# Patient Record
Sex: Female | Born: 1935
Health system: Southern US, Community
[De-identification: ages and names within clinical notes are randomized; demographics above are authoritative.]

## PROBLEM LIST (undated history)

## (undated) ENCOUNTER — Ambulatory Visit: Admission: EM | Discharge status: Absent without leave | Payer: Medicare Other

## (undated) DIAGNOSIS — Z972 Presence of dental prosthetic device (complete) (partial): Secondary | ICD-10-CM

## (undated) DIAGNOSIS — I10 Essential (primary) hypertension: Secondary | ICD-10-CM

## (undated) DIAGNOSIS — E119 Type 2 diabetes mellitus without complications: Secondary | ICD-10-CM

## (undated) DIAGNOSIS — K08109 Complete loss of teeth, unspecified cause, unspecified class: Secondary | ICD-10-CM

## (undated) DIAGNOSIS — N182 Chronic kidney disease, stage 2 (mild): Secondary | ICD-10-CM

## (undated) DIAGNOSIS — E1165 Type 2 diabetes mellitus with hyperglycemia: Principal | ICD-10-CM

## (undated) DIAGNOSIS — R413 Other amnesia: Secondary | ICD-10-CM

## (undated) DIAGNOSIS — H269 Unspecified cataract: Secondary | ICD-10-CM

## (undated) DIAGNOSIS — F0390 Unspecified dementia without behavioral disturbance: Secondary | ICD-10-CM

## (undated) DIAGNOSIS — Z973 Presence of spectacles and contact lenses: Secondary | ICD-10-CM

## (undated) DIAGNOSIS — E785 Hyperlipidemia, unspecified: Secondary | ICD-10-CM

## (undated) DIAGNOSIS — M810 Age-related osteoporosis without current pathological fracture: Secondary | ICD-10-CM

## (undated) DIAGNOSIS — G250 Essential tremor: Secondary | ICD-10-CM

## (undated) DIAGNOSIS — E559 Vitamin D deficiency, unspecified: Secondary | ICD-10-CM

## (undated) HISTORY — DX: Essential tremor: G25.0

## (undated) HISTORY — PX: APPENDECTOMY: SHX54

## (undated) HISTORY — PX: COLONOSCOPY: SHX174

## (undated) HISTORY — DX: Type 2 diabetes mellitus without complications: E11.9

## (undated) HISTORY — PX: TUBAL LIGATION: SHX77

## (undated) HISTORY — DX: Other amnesia: R41.3

## (undated) HISTORY — DX: Hyperlipidemia, unspecified: E78.5

## (undated) HISTORY — DX: Age-related osteoporosis without current pathological fracture: M81.0

## (undated) HISTORY — DX: Chronic kidney disease, stage 2 (mild): N18.2

## (undated) HISTORY — DX: Unspecified cataract: H26.9

## (undated) HISTORY — DX: Type 2 diabetes mellitus with hyperglycemia: E11.65

## (undated) HISTORY — DX: Vitamin D deficiency, unspecified: E55.9

---

## 2001-09-01 ENCOUNTER — Encounter: Payer: Self-pay | Admitting: Emergency Medicine

## 2001-09-01 ENCOUNTER — Emergency Department (HOSPITAL_COMMUNITY): Admission: EM | Admit: 2001-09-01 | Discharge: 2001-09-01 | Payer: Self-pay | Admitting: Emergency Medicine

## 2008-02-11 HISTORY — PX: OTHER SURGICAL HISTORY: SHX169

## 2011-12-02 ENCOUNTER — Encounter: Payer: Self-pay | Admitting: Internal Medicine

## 2012-01-05 ENCOUNTER — Encounter: Payer: Self-pay | Admitting: *Deleted

## 2012-01-29 ENCOUNTER — Encounter: Payer: Self-pay | Admitting: Internal Medicine

## 2012-01-30 ENCOUNTER — Ambulatory Visit: Payer: Self-pay | Admitting: Internal Medicine

## 2012-02-11 HISTORY — PX: CATARACT EXTRACTION, BILATERAL: SHX1313

## 2012-02-27 ENCOUNTER — Ambulatory Visit (AMBULATORY_SURGERY_CENTER): Payer: Medicare Other | Admitting: *Deleted

## 2012-02-27 VITALS — Ht 64.5 in | Wt 183.0 lb

## 2012-02-27 DIAGNOSIS — Z1211 Encounter for screening for malignant neoplasm of colon: Secondary | ICD-10-CM

## 2012-02-27 MED ORDER — MOVIPREP 100 G PO SOLR: 1.0000 | Freq: Once | ORAL | Status: DC

## 2012-02-27 NOTE — Progress Notes (Signed)
Daughter very concerned about removing polyps at the same time as screening. Stated they had a family member that had a perforation and required multiple surgeries all because the physician was in too big a hurry. Offered to cancel appointment and schedule office visit for patient and daughter to express concerns. Patient wishes to proceed with procedure without appointment. Daughter signed permit for patient as patient states she cannot see well with her glasses and that daughter has guardianship for her.

## 2012-03-12 ENCOUNTER — Encounter: Payer: Self-pay | Admitting: Internal Medicine

## 2012-03-12 ENCOUNTER — Ambulatory Visit (AMBULATORY_SURGERY_CENTER): Payer: Medicare Other | Admitting: Internal Medicine

## 2012-03-12 VITALS — BP 153/91 | HR 90 | Temp 97.3°F | Resp 14 | Ht 65.0 in | Wt 183.0 lb

## 2012-03-12 DIAGNOSIS — Z1211 Encounter for screening for malignant neoplasm of colon: Secondary | ICD-10-CM

## 2012-03-12 DIAGNOSIS — D126 Benign neoplasm of colon, unspecified: Secondary | ICD-10-CM

## 2012-03-12 MED ORDER — SODIUM CHLORIDE 0.9 % IV SOLN: 500.0000 mL | INTRAVENOUS | Status: DC

## 2012-03-12 NOTE — Op Note (Signed)
[facility] [address] [place], [address]   COLONOSCOPY PROCEDURE REPORT  PATIENT: Stacey, Fuentes  MR#: [id number] BIRTHDATE: 22-Jul-1935 , 76  yrs. old GENDER: Female ENDOSCOPIST: Hart Carwin, MD REFERRED BY:  Laurann Montana, M.D. PROCEDURE DATE:  03/12/2012 PROCEDURE:   Colonoscopy with snare polypectomy ASA CLASS:   Class II INDICATIONS:Average risk patient for colon cancer. MEDICATIONS: Propofol (Diprivan) 230 mg IV  DESCRIPTION OF PROCEDURE:   After the risks and benefits and of the procedure were explained, informed consent was obtained.  A digital rectal exam revealed no abnormalities of the rectum.    The LB [id number]-[id number]  endoscope was introduced through the anus and advanced to the cecum, which was identified by both the appendix and ileocecal valve .  The quality of the prep was good, using MoviPrep .  The instrument was then slowly withdrawn as the colon was fully examined.     COLON FINDINGS: A smooth sessile polyp ranging between 3-4mm in size was found in the ascending colon.  A polypectomy was performed with a cold snare.  The resection was complete and the polyp tissue was completely retrieved.     Retroflexed views revealed no abnormalities.Sigmoid colon was spastic, pt was unable to retain air due to decreased sphincter tone.     The scope was then withdrawn from the patient and the procedure completed.  COMPLICATIONS: There were no complications. ENDOSCOPIC IMPRESSION: Sessile polyp ranging between 3-57mm in size was found in the ascending colon; polypectomy was performed with a cold snare  RECOMMENDATIONS: 1.  Await pathology results 2.  High fiber diet   REPEAT EXAM: for Colonoscopy. pending results of the polyp  cc:  _______________________________ eSigned:  Hart Carwin, MD 03/12/2012 10:59 AM     PATIENT NAME:  Stacey, Fuentes MR#: [id number]

## 2012-03-12 NOTE — Progress Notes (Signed)
Called to room to assist during endoscopic procedure.  Patient ID and intended procedure confirmed with present staff. Received instructions for my participation in the procedure from the performing physician.  

## 2012-03-12 NOTE — Progress Notes (Signed)
Patient did not experience any of the following events: a burn prior to discharge; a fall within the facility; wrong site/side/patient/procedure/implant event; or a hospital transfer or hospital admission upon discharge from the facility. (G8907) Patient with preoperative order for IV antibiotic SSI prophylaxis, antibiotic initiated on time. (G8916)  

## 2012-03-12 NOTE — Patient Instructions (Addendum)
YOU HAD AN ENDOSCOPIC PROCEDURE TODAY AT THE [facility]: Refer to the procedure report that was given to you for any specific questions about what was found during the examination.  If the procedure report does not answer your questions, please call your gastroenterologist to clarify.  If you requested that your care partner not be given the details of your procedure findings, then the procedure report has been included in a sealed envelope for you to review at your convenience later.  YOU SHOULD EXPECT: Some feelings of bloating in the abdomen. Passage of more gas than usual.  Walking can help get rid of the air that was put into your GI tract during the procedure and reduce the bloating. If you had a lower endoscopy (such as a colonoscopy or flexible sigmoidoscopy) you may notice spotting of blood in your stool or on the toilet paper. If you underwent a bowel prep for your procedure, then you may not have a normal bowel movement for a few days.  DIET: Your first meal following the procedure should be a light meal and then it is ok to progress to your normal diet.  A half-sandwich or bowl of soup is an example of a good first meal.  Heavy or fried foods are harder to digest and may make you feel nauseous or bloated.  Likewise meals heavy in dairy and vegetables can cause extra gas to form and this can also increase the bloating.  Drink plenty of fluids but you should avoid alcoholic beverages for 24 hours.  ACTIVITY: Your care partner should take you home directly after the procedure.  You should plan to take it easy, moving slowly for the rest of the day.  You can resume normal activity the day after the procedure however you should NOT DRIVE or use heavy machinery for 24 hours (because of the sedation medicines used during the test).    SYMPTOMS TO REPORT IMMEDIATELY: A gastroenterologist can be reached at any hour.  During normal business hours, 8:30 AM to 5:00 PM Monday through Friday,  call [phone].  After hours and on weekends, please call the GI answering service at [phone] who will take a message and have the physician on call contact you.   Following lower endoscopy (colonoscopy or flexible sigmoidoscopy):  Excessive amounts of blood in the stool  Significant tenderness or worsening of abdominal pains  Swelling of the abdomen that is new, acute  Fever of 100F or higher    FOLLOW UP: If any biopsies were taken you will be contacted by phone or by letter within the next 1-3 weeks.  Call your gastroenterologist if you have not heard about the biopsies in 3 weeks.  Our staff will call the home number listed on your records the next business day following your procedure to check on you and address any questions or concerns that you may have at that time regarding the information given to you following your procedure. This is a courtesy call and so if there is no answer at the home number and we have not heard from you through the emergency physician on call, we will assume that you have returned to your regular daily activities without incident.  SIGNATURES/CONFIDENTIALITY: You and/or your care partner have signed paperwork which will be entered into your electronic medical record.  These signatures attest to the fact that that the information above on your After Visit Summary has been reviewed and is understood.  Full responsibility of the confidentiality   of this discharge information lies with you and/or your care-partner.  Polyp and high fiber information given. 

## 2012-03-15 ENCOUNTER — Telehealth: Payer: Self-pay | Admitting: *Deleted

## 2012-03-15 NOTE — Telephone Encounter (Signed)
  Follow up Call-  Call back number 03/12/2012  Post procedure Call Back phone  # [phone]  Permission to leave phone message Yes     Patient questions:  Do you have a fever, pain , or abdominal swelling? no Pain Score  0 *  Have you tolerated food without any problems? yes  Have you been able to return to your normal activities? yes  Do you have any questions about your discharge instructions: Diet   no Medications  no Follow up visit  no  Do you have questions or concerns about your Care? no  Actions: * If pain score is 4 or above: No action needed, pain <4. SPOKE WITH PATIENT'S DAUGHTER WHO REPORTS HER MOTHER IS OK. DENIES FEVER, PAIN OR SWELLING. PATIENT BACK TO NORMAL. PATIENT DID EXPERIENCE ELEVATED BLOOD SUGAR ON SATURDAY, BUT BACK TO NORMAL ON Sunday.

## 2012-03-16 ENCOUNTER — Encounter: Payer: Self-pay | Admitting: Internal Medicine

## 2012-12-17 ENCOUNTER — Ambulatory Visit (INDEPENDENT_AMBULATORY_CARE_PROVIDER_SITE_OTHER): Payer: Medicare Other | Admitting: General Surgery

## 2013-04-15 ENCOUNTER — Encounter (INDEPENDENT_AMBULATORY_CARE_PROVIDER_SITE_OTHER): Payer: Self-pay | Admitting: General Surgery

## 2013-04-15 ENCOUNTER — Ambulatory Visit (INDEPENDENT_AMBULATORY_CARE_PROVIDER_SITE_OTHER): Payer: PRIVATE HEALTH INSURANCE | Admitting: General Surgery

## 2013-04-15 VITALS — BP 128/74 | HR 75 | Temp 98.5°F | Resp 16 | Ht 64.0 in | Wt 180.0 lb

## 2013-04-15 DIAGNOSIS — L723 Sebaceous cyst: Secondary | ICD-10-CM

## 2013-04-15 DIAGNOSIS — L089 Local infection of the skin and subcutaneous tissue, unspecified: Secondary | ICD-10-CM | POA: Insufficient documentation

## 2013-04-15 NOTE — Progress Notes (Signed)
Patient ID: Stacey Fuentes, female   DOB: 05-01-1935, 78 y.o.   MRN: [id number]  Chief Complaint  Patient presents with  . Cyst    HPI Stacey Fuentes is a 78 y.o. female.  We're asked to see the patient in consultation by Dr. Dema Severin to evaluate her for a cyst on her back. The patient is a 78 year old black female who has had a cyst on her back for a number of years. It intermittently will flareup and become painful and swollen and draining. She has had it incised at urgent care is a couple of times in the past. She denies any fevers or chills. The most recent episode has been going on for about 3 weeks.  HPI  Past Medical History  Diagnosis Date  . Diabetes mellitus   . Cataract   . Benign essential tremor   . Hyperlipidemia     Past Surgical History  Procedure Laterality Date  . Surgery to remove cyst on back  2010  . Appendectomy      childhood  . Colonoscopy      in [place] in 2000    Family History  Problem Relation Age of Onset  . Colon cancer Neg Hx   . Esophageal cancer Neg Hx   . Rectal cancer Neg Hx   . Stomach cancer Neg Hx     Social History History  Substance Use Topics  . Smoking status: Never Smoker   . Smokeless tobacco: Never Used  . Alcohol Use: No    Allergies  Allergen Reactions  . Metformin And Related     Current Outpatient Prescriptions  Medication Sig Dispense Refill  . aspirin 81 MG tablet Take 81 mg by mouth daily.      . calcium carbonate 200 MG capsule Take 1 capsule by mouth as directed      . insulin glargine (LANTUS SOLOSTAR) 100 UNIT/ML injection Inject 40 Units into the skin at bedtime.      . Omega-3 Fatty Acids (FISH OIL) 1000 MG CAPS Take 1 capsule by mouth daily.       No current facility-administered medications for this visit.    Review of Systems Review of Systems  Constitutional: Negative.   HENT: Negative.   Eyes: Negative.   Respiratory: Negative.   Cardiovascular: Negative.   Gastrointestinal: Negative.    Endocrine: Negative.   Genitourinary: Negative.   Musculoskeletal: Negative.   Skin: Negative.   Allergic/Immunologic: Negative.   Neurological: Negative.   Hematological: Negative.   Psychiatric/Behavioral: Negative.     Blood pressure 128/74, pulse 75, temperature 98.5 F (36.9 C), temperature source Oral, resp. rate 16, height 5\' 4"  (1.626 m), weight 180 lb (81.647 kg).  Physical Exam Physical Exam  Constitutional: She is oriented to person, place, and time. She appears well-developed and well-nourished.  HENT:  Head: Normocephalic and atraumatic.  Eyes: Conjunctivae and EOM are normal. Pupils are equal, round, and reactive to light.  Neck: Normal range of motion. Neck supple.  Cardiovascular: Normal rate, regular rhythm and normal heart sounds.   Pulmonary/Chest: Effort normal and breath sounds normal.  There is an infected sebaceous cyst on her back that is fairly large that is draining purulent material. This area was prepped with ChloraPrep and infiltrated with 1% lidocaine. A cruciate incision was made overlying the abscess and a large amount of purulent material was drained out. The wound was then packed with gauze. She tolerated this well. The family was instructed on dressing changes.  Abdominal: Soft. Bowel  sounds are normal.  Musculoskeletal: Normal range of motion.  Lymphadenopathy:    She has no cervical adenopathy.  Neurological: She is alert and oriented to person, place, and time.  Skin: Skin is warm and dry.  Psychiatric: She has a normal mood and affect. Her behavior is normal.    Data Reviewed As above  Assessment    The patient has a large infected sebaceous cyst on the back. I incised and drained the infection today but I do not think it will resolve unless the rind of the cavity is completely removed. I think this will need to be done in the operating room. I've discussed with her in detail the risks and benefits of the operation to do this as well as  some of the technical aspects and she understands and wishes to proceed     Plan    Plan for incision and drainage of infected sebaceous cyst on the back        TOTH III,Matraca Hunkins S 04/15/2013, 4:48 PM

## 2013-05-11 ENCOUNTER — Encounter (HOSPITAL_BASED_OUTPATIENT_CLINIC_OR_DEPARTMENT_OTHER): Payer: Self-pay | Admitting: *Deleted

## 2013-05-11 NOTE — Progress Notes (Signed)
Talked with daughter-she will bring her for [id number]-[id number]- Mom does not see well Daughter had concerns for cysy close to spine-she will drr dr toth am surgery.

## 2013-05-16 ENCOUNTER — Other Ambulatory Visit (INDEPENDENT_AMBULATORY_CARE_PROVIDER_SITE_OTHER): Payer: Self-pay | Admitting: General Surgery

## 2013-05-17 ENCOUNTER — Encounter (HOSPITAL_BASED_OUTPATIENT_CLINIC_OR_DEPARTMENT_OTHER)
Admission: RE | Admit: 2013-05-17 | Discharge: 2013-05-17 | Disposition: A | Discharge status: Home / Self Care | Payer: PRIVATE HEALTH INSURANCE | Source: Ambulatory Visit | Attending: General Surgery | Admitting: General Surgery

## 2013-05-17 LAB — BASIC METABOLIC PANEL
BUN: 28 mg/dL — AB (ref 6–23)
CHLORIDE: 102 meq/L (ref 96–112)
CO2: 26 meq/L (ref 19–32)
Calcium: 9.2 mg/dL (ref 8.4–10.5)
Creatinine, Ser: 0.88 mg/dL (ref 0.50–1.10)
GFR calc Af Amer: 73 mL/min — ABNORMAL LOW (ref >90)
GFR calc non Af Amer: 63 mL/min — ABNORMAL LOW (ref >90)
Glucose, Bld: 410 mg/dL — ABNORMAL HIGH (ref 70–99)
Potassium: 4.5 mEq/L (ref 3.7–5.3)
Sodium: 141 mEq/L (ref 137–147)

## 2013-05-18 ENCOUNTER — Encounter (HOSPITAL_BASED_OUTPATIENT_CLINIC_OR_DEPARTMENT_OTHER): Payer: Self-pay | Admitting: Certified Registered"

## 2013-05-18 ENCOUNTER — Ambulatory Visit (HOSPITAL_BASED_OUTPATIENT_CLINIC_OR_DEPARTMENT_OTHER)
Admission: RE | Admit: 2013-05-18 | Discharge: 2013-05-18 | Disposition: A | Discharge status: Home / Self Care | Payer: PRIVATE HEALTH INSURANCE | Source: Ambulatory Visit | Attending: General Surgery | Admitting: General Surgery

## 2013-05-18 ENCOUNTER — Encounter (HOSPITAL_BASED_OUTPATIENT_CLINIC_OR_DEPARTMENT_OTHER): Payer: PRIVATE HEALTH INSURANCE | Admitting: Certified Registered"

## 2013-05-18 ENCOUNTER — Encounter (HOSPITAL_BASED_OUTPATIENT_CLINIC_OR_DEPARTMENT_OTHER)
Admission: RE | Disposition: A | Discharge status: Home / Self Care | Payer: Self-pay | Source: Ambulatory Visit | Attending: General Surgery

## 2013-05-18 ENCOUNTER — Ambulatory Visit (HOSPITAL_BASED_OUTPATIENT_CLINIC_OR_DEPARTMENT_OTHER): Payer: PRIVATE HEALTH INSURANCE | Admitting: Certified Registered"

## 2013-05-18 DIAGNOSIS — L723 Sebaceous cyst: Secondary | ICD-10-CM

## 2013-05-18 DIAGNOSIS — L089 Local infection of the skin and subcutaneous tissue, unspecified: Secondary | ICD-10-CM

## 2013-05-18 DIAGNOSIS — E119 Type 2 diabetes mellitus without complications: Secondary | ICD-10-CM | POA: Insufficient documentation

## 2013-05-18 DIAGNOSIS — Z794 Long term (current) use of insulin: Secondary | ICD-10-CM | POA: Insufficient documentation

## 2013-05-18 DIAGNOSIS — G250 Essential tremor: Secondary | ICD-10-CM | POA: Insufficient documentation

## 2013-05-18 DIAGNOSIS — Z01810 Encounter for preprocedural cardiovascular examination: Secondary | ICD-10-CM | POA: Insufficient documentation

## 2013-05-18 DIAGNOSIS — E785 Hyperlipidemia, unspecified: Secondary | ICD-10-CM | POA: Insufficient documentation

## 2013-05-18 DIAGNOSIS — H269 Unspecified cataract: Secondary | ICD-10-CM | POA: Insufficient documentation

## 2013-05-18 DIAGNOSIS — G252 Other specified forms of tremor: Secondary | ICD-10-CM

## 2013-05-18 HISTORY — DX: Presence of dental prosthetic device (complete) (partial): Z97.2

## 2013-05-18 HISTORY — PX: INCISION AND DRAINAGE ABSCESS: SHX5864

## 2013-05-18 HISTORY — DX: Presence of spectacles and contact lenses: Z97.3

## 2013-05-18 HISTORY — DX: Complete loss of teeth, unspecified cause, unspecified class: K08.109

## 2013-05-18 LAB — GLUCOSE, CAPILLARY
GLUCOSE-CAPILLARY: 231 mg/dL — AB (ref 70–99)
Glucose-Capillary: 200 mg/dL — ABNORMAL HIGH (ref 70–99)

## 2013-05-18 LAB — POCT HEMOGLOBIN-HEMACUE: Hemoglobin: 11.7 g/dL — ABNORMAL LOW (ref 12.0–15.0)

## 2013-05-18 SURGERY — INCISION AND DRAINAGE, ABSCESS
Anesthesia: General | Site: Back

## 2013-05-18 MED ORDER — LIDOCAINE HCL (CARDIAC) 20 MG/ML IV SOLN
INTRAVENOUS | Status: DC | PRN
  Administered 2013-05-18: 60 mg via INTRAVENOUS

## 2013-05-18 MED ORDER — CEFAZOLIN SODIUM-DEXTROSE 2-3 GM-% IV SOLR
INTRAVENOUS | Status: AC
  Filled 2013-05-18: qty 50

## 2013-05-18 MED ORDER — EPHEDRINE SULFATE 50 MG/ML IJ SOLN
INTRAMUSCULAR | Status: DC | PRN
  Administered 2013-05-18: 10 mg via INTRAVENOUS

## 2013-05-18 MED ORDER — METOCLOPRAMIDE HCL 5 MG/ML IJ SOLN
INTRAMUSCULAR | Status: DC | PRN
  Administered 2013-05-18: 10 mg via INTRAVENOUS

## 2013-05-18 MED ORDER — FENTANYL CITRATE 0.05 MG/ML IJ SOLN
INTRAMUSCULAR | Status: AC
  Filled 2013-05-18: qty 4

## 2013-05-18 MED ORDER — BUPIVACAINE-EPINEPHRINE PF 0.25-1:200000 % IJ SOLN
INTRAMUSCULAR | Status: AC
  Filled 2013-05-18: qty 30

## 2013-05-18 MED ORDER — FENTANYL CITRATE 0.05 MG/ML IJ SOLN
INTRAMUSCULAR | Status: DC | PRN
  Administered 2013-05-18: 50 ug via INTRAVENOUS

## 2013-05-18 MED ORDER — BACITRACIN-NEOMYCIN-POLYMYXIN 400-5-5000 EX OINT
TOPICAL_OINTMENT | CUTANEOUS | Status: AC
  Filled 2013-05-18: qty 1

## 2013-05-18 MED ORDER — CHLORHEXIDINE GLUCONATE 4 % EX LIQD: 1.0000 "application " | Freq: Once | CUTANEOUS | Status: DC

## 2013-05-18 MED ORDER — ONDANSETRON HCL 4 MG/2ML IJ SOLN
INTRAMUSCULAR | Status: DC | PRN
  Administered 2013-05-18: 4 mg via INTRAVENOUS

## 2013-05-18 MED ORDER — BUPIVACAINE-EPINEPHRINE 0.25% -1:200000 IJ SOLN
INTRAMUSCULAR | Status: DC | PRN
  Administered 2013-05-18: 10 mL

## 2013-05-18 MED ORDER — LACTATED RINGERS IV SOLN
INTRAVENOUS | Status: DC
  Administered 2013-05-18: 10:00:00 via INTRAVENOUS

## 2013-05-18 MED ORDER — SUCCINYLCHOLINE CHLORIDE 20 MG/ML IJ SOLN
INTRAMUSCULAR | Status: AC
  Filled 2013-05-18: qty 1

## 2013-05-18 MED ORDER — MIDAZOLAM HCL 2 MG/2ML IJ SOLN: 1.0000 mg | INTRAMUSCULAR | Status: DC | PRN

## 2013-05-18 MED ORDER — MIDAZOLAM HCL 2 MG/2ML IJ SOLN
INTRAMUSCULAR | Status: AC
  Filled 2013-05-18: qty 2

## 2013-05-18 MED ORDER — SUCCINYLCHOLINE CHLORIDE 20 MG/ML IJ SOLN
INTRAMUSCULAR | Status: DC | PRN
  Administered 2013-05-18: 100 mg via INTRAVENOUS

## 2013-05-18 MED ORDER — PROPOFOL 10 MG/ML IV BOLUS
INTRAVENOUS | Status: DC | PRN
  Administered 2013-05-18: 20 mg via INTRAVENOUS
  Administered 2013-05-18: 150 mg via INTRAVENOUS

## 2013-05-18 MED ORDER — CEFAZOLIN SODIUM-DEXTROSE 2-3 GM-% IV SOLR
2.0000 g | INTRAVENOUS | Status: AC
  Administered 2013-05-18: 2 g via INTRAVENOUS

## 2013-05-18 MED ORDER — OXYCODONE-ACETAMINOPHEN 5-325 MG PO TABS: 1.0000 | ORAL_TABLET | ORAL | Status: DC | PRN

## 2013-05-18 MED ORDER — FENTANYL CITRATE 0.05 MG/ML IJ SOLN: 50.0000 ug | INTRAMUSCULAR | Status: DC | PRN

## 2013-05-18 MED ORDER — PROPOFOL 10 MG/ML IV BOLUS
INTRAVENOUS | Status: AC
  Filled 2013-05-18: qty 20

## 2013-05-18 MED ORDER — BACITRACIN-NEOMYCIN-POLYMYXIN OINTMENT TUBE
TOPICAL_OINTMENT | CUTANEOUS | Status: DC | PRN
  Administered 2013-05-18: 1 via TOPICAL

## 2013-05-18 SURGICAL SUPPLY — 43 items
APL SKNCLS STERI-STRIP NONHPOA (GAUZE/BANDAGES/DRESSINGS) ×2
BENZOIN TINCTURE PRP APPL 2/3 (GAUZE/BANDAGES/DRESSINGS) ×4 IMPLANT
BLADE SURG 15 STRL LF DISP TIS (BLADE) ×1 IMPLANT
BLADE SURG 15 STRL SS (BLADE) ×2
BLADE SURG ROTATE 9660 (MISCELLANEOUS) ×2 IMPLANT
BNDG GAUZE ELAST 4 BULKY (GAUZE/BANDAGES/DRESSINGS) IMPLANT
CANISTER SUCT 1200ML W/VALVE (MISCELLANEOUS) ×2 IMPLANT
CHLORAPREP W/TINT 26ML (MISCELLANEOUS) ×2 IMPLANT
CLEANER CAUTERY TIP 5X5 PAD (MISCELLANEOUS) ×1 IMPLANT
COVER MAYO STAND STRL (DRAPES) ×2 IMPLANT
COVER TABLE BACK 60X90 (DRAPES) ×2 IMPLANT
DECANTER SPIKE VIAL GLASS SM (MISCELLANEOUS) IMPLANT
DRAPE LAPAROTOMY T 102X78X121 (DRAPES) ×2 IMPLANT
DRAPE UTILITY XL STRL (DRAPES) ×2 IMPLANT
DRSG PAD ABDOMINAL 8X10 ST (GAUZE/BANDAGES/DRESSINGS) IMPLANT
ELECT REM PT RETURN 9FT ADLT (ELECTROSURGICAL) ×2
ELECTRODE REM PT RTRN 9FT ADLT (ELECTROSURGICAL) ×1 IMPLANT
GAUZE SPONGE 4X4 16PLY XRAY LF (GAUZE/BANDAGES/DRESSINGS) IMPLANT
GLOVE BIO SURGEON STRL SZ 6.5 (GLOVE) ×1 IMPLANT
GLOVE BIO SURGEON STRL SZ7.5 (GLOVE) ×2 IMPLANT
GLOVE BIOGEL PI IND STRL 7.0 (GLOVE) IMPLANT
GLOVE BIOGEL PI INDICATOR 7.0 (GLOVE) ×1
GOWN STRL REUS W/ TWL LRG LVL3 (GOWN DISPOSABLE) ×2 IMPLANT
GOWN STRL REUS W/TWL LRG LVL3 (GOWN DISPOSABLE) ×4
NDL HYPO 25X1 1.5 SAFETY (NEEDLE) ×1 IMPLANT
NEEDLE HYPO 25X1 1.5 SAFETY (NEEDLE) ×2 IMPLANT
NS IRRIG 1000ML POUR BTL (IV SOLUTION) IMPLANT
PACK BASIN DAY SURGERY FS (CUSTOM PROCEDURE TRAY) ×2 IMPLANT
PAD CLEANER CAUTERY TIP 5X5 (MISCELLANEOUS) ×1
PENCIL BUTTON HOLSTER BLD 10FT (ELECTRODE) ×2 IMPLANT
SPONGE GAUZE 4X4 12PLY STER LF (GAUZE/BANDAGES/DRESSINGS) ×4 IMPLANT
SPONGE LAP 4X18 X RAY DECT (DISPOSABLE) IMPLANT
STRIP CLOSURE SKIN 1/2X4 (GAUZE/BANDAGES/DRESSINGS) IMPLANT
SUT ETHILON 3 0 PS 1 (SUTURE) ×2 IMPLANT
SUT PROLENE 3 0 PS 2 (SUTURE) IMPLANT
SUT PROLENE 4 0 PS 2 18 (SUTURE) IMPLANT
SUT VICRYL 3-0 CR8 SH (SUTURE) IMPLANT
SYR CONTROL 10ML LL (SYRINGE) ×2 IMPLANT
TAPE CLOTH 3X10 TAN LF (GAUZE/BANDAGES/DRESSINGS) ×2 IMPLANT
TOWEL OR 17X24 6PK STRL BLUE (TOWEL DISPOSABLE) ×4 IMPLANT
TOWEL OR NON WOVEN STRL DISP B (DISPOSABLE) ×2 IMPLANT
TUBE CONNECTING 20X1/4 (TUBING) ×2 IMPLANT
YANKAUER SUCT BULB TIP NO VENT (SUCTIONS) ×2 IMPLANT

## 2013-05-18 NOTE — H&P (Signed)
Stacey Fuentes  04/15/2013 3:45 PM   Office Visit  MRN:  [id number]   Description: 78 year old female  Provider: Merrie Roof, MD  Department: [facility]         Guarantor Account: Kavin Leech ([id number])      Relation to Patient: Account Type Service Area      Self Personal/Family [place] for This Account     Coverage ID Payor Plan Insurance ID      [id number] [place] [employer] [facility] [id number]              Guarantor Account: Karolee, Meloni ([id number])      Relation to Patient: Account Type Service Area      Self Personal/Family [facility]                 Guarantor Account: Ranesha, Val ([id number])      Relation to Patient: Account Type Service Area      Self Personal/Family [place] for This Account     Coverage ID [place] ID      [id number] [place] MEDICARE [facility] [id number]              Guarantor Account: Janneth, Krasner ([id number])      Relation to Patient: Account Type Service Area      Self Personal/Family [facility]                   Diagnoses      Infected sebaceous cyst    -  Primary      706.2             Reason for Visit      Cyst             Current Vitals Most recent update: 04/15/2013  3:56 PM by Vale Haven, CMA      BP Pulse Temp(Src) Resp Ht Wt      128/74 75 98.5 F (36.9 C) (Oral) 16 5\' 4"  (1.626 m) 180 lb (81.647 kg)      BMI                30.88 kg/m2                        Progress Notes      Merrie Roof, MD at 04/15/2013  4:48 PM      Status: Signed            Patient ID: Kavin Leech, female   DOB: 05-31-1935, 78 y.o.   MRN: [id number]    Chief Complaint   Patient presents with   .  Cyst        HPI Stacey Fuentes is a 78 y.o. female.  We're asked to see the patient in consultation by Dr. Dema Severin to evaluate her for a  cyst on her back. The patient is a 78 year old black female who has had a cyst on her back for a number of years. It intermittently will flareup and become painful and swollen and draining. She has had it incised at urgent care is a couple of times in the past. She denies any fevers or chills. The most recent episode has  been going on for about 3 weeks.  HPI    Past Medical History   Diagnosis  Date   .  Diabetes mellitus     .  Cataract     .  Benign essential tremor     .  Hyperlipidemia           Past Surgical History   Procedure  Laterality  Date   .  Surgery to remove cyst on back    2010   .  Appendectomy           childhood   .  Colonoscopy           in [place] in 2000         Family History   Problem  Relation  Age of Onset   .  Colon cancer  Neg Hx     .  Esophageal cancer  Neg Hx     .  Rectal cancer  Neg Hx     .  Stomach cancer  Neg Hx          Social History History   Substance Use Topics   .  Smoking status:  Never Smoker    .  Smokeless tobacco:  Never Used   .  Alcohol Use:  No         Allergies   Allergen  Reactions   .  Metformin And Related           Current Outpatient Prescriptions   Medication  Sig  Dispense  Refill   .  aspirin 81 MG tablet  Take 81 mg by mouth daily.         .  calcium carbonate 200 MG capsule  Take 1 capsule by mouth as directed         .  insulin glargine (LANTUS SOLOSTAR) 100 UNIT/ML injection  Inject 40 Units into the skin at bedtime.         .  Omega-3 Fatty Acids (FISH OIL) 1000 MG CAPS  Take 1 capsule by mouth daily.             No current facility-administered medications for this visit.        Review of Systems Review of Systems  Constitutional: Negative.   HENT: Negative.   Eyes: Negative.   Respiratory: Negative.   Cardiovascular: Negative.   Gastrointestinal: Negative.   Endocrine: Negative.   Genitourinary: Negative.   Musculoskeletal: Negative.   Skin: Negative.   Allergic/Immunologic:  Negative.   Neurological: Negative.   Hematological: Negative.   Psychiatric/Behavioral: Negative.       Blood pressure 128/74, pulse 75, temperature 98.5 F (36.9 C), temperature source Oral, resp. rate 16, height 5\' 4"  (1.626 m), weight 180 lb (81.647 kg).   Physical Exam Physical Exam  Constitutional: She is oriented to person, place, and time. She appears well-developed and well-nourished.  HENT:   Head: Normocephalic and atraumatic.  Eyes: Conjunctivae and EOM are normal. Pupils are equal, round, and reactive to light.  Neck: Normal range of motion. Neck supple.  Cardiovascular: Normal rate, regular rhythm and normal heart sounds.   Pulmonary/Chest: Effort normal and breath sounds normal.  There is an infected sebaceous cyst on her back that is fairly large that is draining purulent material. This area was prepped with ChloraPrep and infiltrated with 1% lidocaine. A cruciate incision was made overlying the abscess and a large amount of purulent material was drained out. The wound was then packed with  gauze. She tolerated this well. The family was instructed on dressing changes.  Abdominal: Soft. Bowel sounds are normal.  Musculoskeletal: Normal range of motion.  Lymphadenopathy:    She has no cervical adenopathy.  Neurological: She is alert and oriented to person, place, and time.  Skin: Skin is warm and dry.  Psychiatric: She has a normal mood and affect. Her behavior is normal.      Data Reviewed As above   Assessment    The patient has a large infected sebaceous cyst on the back. I incised and drained the infection today but I do not think it will resolve unless the rind of the cavity is completely removed. I think this will need to be done in the operating room. I've discussed with her in detail the risks and benefits of the operation to do this as well as some of the technical aspects and she understands and wishes to proceed      Plan    Plan for incision and  drainage of infected sebaceous cyst on the back

## 2013-05-18 NOTE — Anesthesia Procedure Notes (Signed)
Procedure Name: Intubation Date/Time: 05/18/2013 10:51 AM Performed by: Baxter Flattery Pre-anesthesia Checklist: Patient identified, Emergency Drugs available, Suction available and Patient being monitored Patient Re-evaluated:Patient Re-evaluated prior to inductionOxygen Delivery Method: Circle System Utilized Preoxygenation: Pre-oxygenation with 100% oxygen Intubation Type: IV induction Ventilation: Mask ventilation without difficulty Laryngoscope Size: Miller and 2 Grade View: Grade I Tube type: Oral Tube size: 7.0 mm Number of attempts: 1 Airway Equipment and Method: stylet Placement Confirmation: ETT inserted through vocal cords under direct vision,  positive ETCO2 and breath sounds checked- equal and bilateral Secured at: 21 cm Tube secured with: Tape Dental Injury: Teeth and Oropharynx as per pre-operative assessment

## 2013-05-18 NOTE — Interval H&P Note (Signed)
History and Physical Interval Note:  05/18/2013 10:14 AM  Stacey Fuentes  has presented today for surgery, with the diagnosis of incision and drainage infected cyst back  The various methods of treatment have been discussed with the patient and family. After consideration of risks, benefits and other options for treatment, the patient has consented to  Procedure(s): INCISION AND DRAINAGE INFECTED CYST BACK (N/A) as a surgical intervention .  The patient's history has been reviewed, patient examined, no change in status, stable for surgery.  I have reviewed the patient's chart and labs.  Questions were answered to the patient's satisfaction.     Luella Cook III

## 2013-05-18 NOTE — Anesthesia Postprocedure Evaluation (Signed)
  Anesthesia Post-op Note  Patient: Stacey Fuentes  Procedure(s) Performed: Procedure(s): EXCISION OF SEBACEOUS CYST BACK (N/A)  Patient Location: PACU  Anesthesia Type:General  Level of Consciousness: awake, alert  and oriented  Airway and Oxygen Therapy: Patient Spontanous Breathing  Post-op Pain: none  Post-op Assessment: Post-op Vital signs reviewed  Post-op Vital Signs: Reviewed  Last Vitals:  Filed Vitals:   05/18/13 1200  BP: 135/72  Pulse: 90  Temp:   Resp: 18    Complications: No apparent anesthesia complications

## 2013-05-18 NOTE — Anesthesia Preprocedure Evaluation (Signed)
Anesthesia Evaluation  Patient identified by MRN, date of birth, ID band Patient awake    Reviewed: Allergy & Precautions, H&P , NPO status , Patient's Chart, lab work & pertinent test results, reviewed documented beta blocker date and time   Airway Mallampati: II TM Distance: >3 FB Neck ROM: full    Dental   Pulmonary neg pulmonary ROS,  breath sounds clear to auscultation        Cardiovascular negative cardio ROS  Rhythm:regular     Neuro/Psych negative neurological ROS  negative psych ROS   GI/Hepatic negative GI ROS, Neg liver ROS,   Endo/Other  diabetes, Poorly Controlled, Insulin Dependent  Renal/GU negative Renal ROS  negative genitourinary   Musculoskeletal   Abdominal   Peds  Hematology negative hematology ROS (+)   Anesthesia Other Findings See surgeon's H&P   Reproductive/Obstetrics negative OB ROS                           Anesthesia Physical Anesthesia Plan  ASA: III  Anesthesia Plan: General   Post-op Pain Management:    Induction: Intravenous  Airway Management Planned: Oral ETT  Additional Equipment:   Intra-op Plan:   Post-operative Plan: Extubation in OR  Informed Consent: I have reviewed the patients History and Physical, chart, labs and discussed the procedure including the risks, benefits and alternatives for the proposed anesthesia with the patient or authorized representative who has indicated his/her understanding and acceptance.   Dental Advisory Given  Plan Discussed with: CRNA and Surgeon  Anesthesia Plan Comments:         Anesthesia Quick Evaluation

## 2013-05-18 NOTE — Transfer of Care (Signed)
Immediate Anesthesia Transfer of Care Note  Patient: Stacey Fuentes  Procedure(s) Performed: Procedure(s): EXCISION OF SEBACEOUS CYST BACK (N/A)  Patient Location: PACU  Anesthesia Type:General  Level of Consciousness: awake, alert  and oriented  Airway & Oxygen Therapy: Patient Spontanous Breathing and Patient connected to face mask oxygen  Post-op Assessment: Report given to PACU RN, Post -op Vital signs reviewed and stable and Patient moving all extremities  Post vital signs: Reviewed and stable  Complications: No apparent anesthesia complications

## 2013-05-18 NOTE — Discharge Instructions (Signed)

## 2013-05-18 NOTE — Op Note (Addendum)
05/18/2013  11:21 AM  PATIENT:  Stacey Fuentes  78 y.o. female  PRE-OPERATIVE DIAGNOSIS:  infected cyst back  POST-OPERATIVE DIAGNOSIS:  INFECTED SEBACEOUS CYST BACK  PROCEDURE:  Procedure(s): EXCISION OF SEBACEOUS CYST BACK (N/A)  SURGEON:  Surgeon(s) and Role:    * Merrie Roof, MD - Primary  PHYSICIAN ASSISTANT:   ASSISTANTS: none   ANESTHESIA:   general  EBL:  Total I/O In: 200 [I.V.:200] Out: -   BLOOD ADMINISTERED:none  DRAINS: none   LOCAL MEDICATIONS USED:  MARCAINE     SPECIMEN:  Source of Specimen:  sebaceous cyst of back  DISPOSITION OF SPECIMEN:  PATHOLOGY  COUNTS:  YES  TOURNIQUET:  * No tourniquets in log *  DICTATION: .Dragon Dictation After informed consent was obtained the patient was brought to the operating room placed in the supine position on the stretcher. After adequate induction of general anesthesia the patient was moved into a prone position on the operating room table all pressure points are padded. Her back area was prepped with ChloraPrep, allowed to dry, and draped in usual sterile manner. An elliptical incision was made around the cyst and this incision was carried through the skin and subcutaneous tissue sharply with a 15 blade knife so that the cyst was completely removed all the way down through the subcutaneous tissue. The total size of the removed area was approximately 3 cm in diameter. Once this was accomplished there was no remnant of the cyst cavity left. The cyst itself was sent to pathology for further evaluation. Hemostasis was achieved using the Bovie electrocautery. Because the wound was clean and we never entered the cyst cavity we decided to close the wound. The wound was closed with interrupted 3-0 nylon vertical mattress stitches as well as 3-0 nylon simple stitches. Once this was accomplished the edges of the wound were nicely approximated. The wound was coated with triple antibiotic ointment and sterile dressings were  applied. The patient tolerated the procedure well. At the end of the case all needle sponge and instrument counts were correct. The patient was then awakened and taken to recovery in stable condition.  PLAN OF CARE: Discharge to home after PACU  PATIENT DISPOSITION:  PACU - hemodynamically stable.   Delay start of Pharmacological VTE agent (>24hrs) due to surgical blood loss or risk of bleeding: not applicable

## 2013-05-19 ENCOUNTER — Encounter (HOSPITAL_BASED_OUTPATIENT_CLINIC_OR_DEPARTMENT_OTHER): Payer: Self-pay | Admitting: General Surgery

## 2013-06-06 ENCOUNTER — Encounter (INDEPENDENT_AMBULATORY_CARE_PROVIDER_SITE_OTHER): Payer: Self-pay | Admitting: General Surgery

## 2013-06-06 ENCOUNTER — Ambulatory Visit (INDEPENDENT_AMBULATORY_CARE_PROVIDER_SITE_OTHER): Payer: PRIVATE HEALTH INSURANCE | Admitting: General Surgery

## 2013-06-06 VITALS — BP 126/72 | HR 75 | Temp 98.2°F | Resp 16 | Ht 64.0 in | Wt 178.4 lb

## 2013-06-06 DIAGNOSIS — L723 Sebaceous cyst: Secondary | ICD-10-CM

## 2013-06-06 DIAGNOSIS — L089 Local infection of the skin and subcutaneous tissue, unspecified: Secondary | ICD-10-CM

## 2013-06-06 NOTE — Patient Instructions (Signed)
May return to normal activities 

## 2013-06-06 NOTE — Progress Notes (Signed)
Subjective:     Patient ID: Stacey Fuentes, female   DOB: [birth year], 78 y.o.   MRN: [id number]  HPI The patient is a 78 year old black female who is almost 3 weeks status post excision of a sebaceous cyst from her back. She has done well and has no complaints today. She denies any pain at the area.  Review of Systems     Objective:   Physical Exam On exam the incision appears to be healing nicely with no sign of infection. The stitches were removed today without difficulty and she tolerated this well.    Assessment:     The patient is 3 weeks status post excision of a sebaceous cyst from her back.     Plan:     At this point she may continue to shower. She can return to her normal activities. I will plan to see her back on a when necessary basis

## 2014-03-16 ENCOUNTER — Other Ambulatory Visit: Payer: Self-pay | Admitting: Family Medicine

## 2014-03-16 DIAGNOSIS — R1011 Right upper quadrant pain: Secondary | ICD-10-CM

## 2014-03-17 ENCOUNTER — Ambulatory Visit
Admission: RE | Admit: 2014-03-17 | Discharge: 2014-03-17 | Disposition: A | Discharge status: Home / Self Care | Payer: Medicare Other | Source: Ambulatory Visit | Attending: Family Medicine | Admitting: Family Medicine

## 2014-03-17 DIAGNOSIS — R1011 Right upper quadrant pain: Secondary | ICD-10-CM

## 2014-03-20 ENCOUNTER — Ambulatory Visit
Admission: RE | Admit: 2014-03-20 | Discharge: 2014-03-20 | Disposition: A | Discharge status: Home / Self Care | Payer: Medicare Other | Source: Ambulatory Visit | Attending: Family Medicine | Admitting: Family Medicine

## 2014-03-20 ENCOUNTER — Other Ambulatory Visit: Payer: Self-pay | Admitting: Family Medicine

## 2014-03-20 DIAGNOSIS — R935 Abnormal findings on diagnostic imaging of other abdominal regions, including retroperitoneum: Secondary | ICD-10-CM

## 2014-03-20 DIAGNOSIS — K839 Disease of biliary tract, unspecified: Secondary | ICD-10-CM

## 2014-03-20 MED ORDER — IOHEXOL 300 MG/ML  SOLN
100.0000 mL | Freq: Once | INTRAMUSCULAR | Status: AC | PRN
  Administered 2014-03-20: 100 mL via INTRAVENOUS

## 2014-03-23 ENCOUNTER — Ambulatory Visit (INDEPENDENT_AMBULATORY_CARE_PROVIDER_SITE_OTHER): Payer: Medicare Other | Admitting: Nurse Practitioner

## 2014-03-23 ENCOUNTER — Other Ambulatory Visit (INDEPENDENT_AMBULATORY_CARE_PROVIDER_SITE_OTHER): Payer: Medicare Other

## 2014-03-23 ENCOUNTER — Encounter: Payer: Self-pay | Admitting: Nurse Practitioner

## 2014-03-23 VITALS — BP 100/70 | HR 72 | Ht 64.0 in | Wt 179.4 lb

## 2014-03-23 DIAGNOSIS — R110 Nausea: Secondary | ICD-10-CM | POA: Insufficient documentation

## 2014-03-23 DIAGNOSIS — R1010 Upper abdominal pain, unspecified: Secondary | ICD-10-CM | POA: Diagnosis not present

## 2014-03-23 DIAGNOSIS — R933 Abnormal findings on diagnostic imaging of other parts of digestive tract: Secondary | ICD-10-CM | POA: Diagnosis not present

## 2014-03-23 LAB — AMYLASE: AMYLASE: 63 U/L (ref 27–131)

## 2014-03-23 LAB — LIPASE: Lipase: 1 U/L — ABNORMAL LOW (ref 11.0–59.0)

## 2014-03-23 NOTE — Progress Notes (Signed)
History of Present Illness:  Patient is a 79 year old female known to Dr. Maurene Capes for history of colon polyps. She had a small tubular adenoma removed in 2014. Patient is referred by PCP, Dr. Dema Severin, for evaluation of abdominal pain.   A few months ago patient began having what she refers to as indigestion. Episodes of upper abdominal pain described as originating in epigastrium and radiating toward right upper quadrant, around her right side to mid back. Patient began using Alka-Seltzer for the discomfort which occurred about twice a week. Two weeks ago the pain became more pronounced as well as constant. Pain rated a 7 on pain scale. She has had had some associated nausea. Patient has been on omeprazole, she doesn't know for how long.  Patient was evaluated by her PCP 03/16/14. White count, hemoglobin, liver function studies and lipase all.normal. Right upper quadrant ultrasound showed normal liver and gallbladder but CBD dilated to 10.1 mm. CT scan of the abdomen with contrast done to follow-up on abnormal ultrasound revealed diffuse pancreatic atrophy without dilation of the main pancreatic duct. Common bile duct measured about 9 mm. No obstructing lesions or stones present.  Patient's weight has been stable.  Current Medications, Allergies, Past Medical History, Past Surgical History, Family History and Social History were reviewed in [employer] record.  Studies:   Ct Abdomen W Contrast  03/20/2014   CLINICAL DATA:  Subsequent encounter for biliary duct dilatation.  EXAM: CT ABDOMEN WITH CONTRAST  TECHNIQUE: Multidetector CT imaging of the abdomen was performed using the standard protocol following bolus administration of intravenous contrast.  CONTRAST:  129mL OMNIPAQUE IOHEXOL 300 MG/ML  SOLN  COMPARISON:  Ultrasound from 03/17/2014  FINDINGS: Lower chest:  Chronic interstitial changes noted in the lower lungs.  Hepatobiliary: No focal abnormality within the liver  parenchyma. There is no evidence for gallstones, gallbladder wall thickening, or pericholecystic fluid. There is no intrahepatic biliary duct dilatation. The extrahepatic common duct measures about 9 mm on today's CT scan. In the head of the pancreas, the common bile duct measures 4 mm in diameter.  Pancreas: Diffuse pancreatic atrophy noted without dilatation of the main duct.  Spleen: No splenomegaly. No focal mass lesion.  Adrenals/Urinary Tract: No adrenal nodule or mass. Kidneys are unremarkable.  Stomach/Bowel: Stomach is nondistended. No gastric wall thickening. No evidence of outlet obstruction. Duodenum is normally positioned as is the ligament of Treitz. Visualize small bowel loops are unremarkable. Abdominal segments of the colon have normal imaging features.  Vascular/Lymphatic: Atherosclerotic calcification is noted in the wall of the abdominal aorta without aneurysm. No gastrohepatic or hepatoduodenal ligament lymphadenopathy. No retroperitoneal lymphadenopathy.  Other: No intraperitoneal free fluid.  Musculoskeletal: Bone windows reveal no worrisome lytic or sclerotic osseous lesions.  IMPRESSION: Isolated mild distention of the common duct up to about 9 mm in diameter. In a patient of this age, 7-8 mm diameter is considered within normal limits. The common duct tapers gradually as it tracks towards the head of the pancreas where in the diameter of the common bile duct is normal. There is no obstructing mass lesion. There is no associated dilatation of the pancreatic duct. If liver function tests are abnormal, ERCP or MRCP may prove helpful.   Electronically Signed   By: Misty Stanley M.D.   On: 03/20/2014 14:54   US Abdomen Limited Ruq  03/17/2014   CLINICAL DATA:  One week history of right upper quadrant pain with nausea ; hyperlipidemia, diabetes.  EXAM: US  ABDOMEN LIMITED - RIGHT UPPER QUADRANT  COMPARISON:  None.  FINDINGS: Gallbladder:  No gallstones or wall thickening visualized. No  sonographic Murphy sign noted.  Common bile duct:  Diameter: 10.1 mm. No common bile duct stone or mass is demonstrated.  Liver:  No focal lesion identified. There is no intrahepatic ductal dilation. Within normal limits in parenchymal echogenicity.  IMPRESSION: Normal appearance of the liver and gallbladder. The common bile duct is dilated. This is of uncertain etiology. CT scanning of the abdomen is recommended.   Electronically Signed   By: David  Martinique   On: 03/17/2014 15:21   Physical Exam: General: Pleasant, well developed , black female in no acute distress Head: Normocephalic and atraumatic Eyes:  sclerae anicteric, conjunctiva pink  Ears: Normal auditory acuity Lungs: Clear throughout to auscultation Heart: Regular rate and rhythm Abdomen: Soft, non distended, non-tender. No masses, no hepatomegaly. Normal bowel sounds Musculoskeletal: Symmetrical with no gross deformities  Extremities: No edema  Neurological: Alert oriented x 4, grossly nonfocal Psychological:  Alert and cooperative. Normal mood and affect  Assessment and Recommendations:   79 year old female with progressive, now constant epigastric pain radiating to right upper quadrant and around to back. She has mild common bile duct dilation without pancreatic masses / gallstones. Her LFTs and lipase were normal last week. Weight is stable.   Etiology of progressive pain not clear. Patient is on a baby aspirin and had been taking Alka-Seltzer until recently. Rule out peptic ulcer disease. For further evaluation patient will be scheduled for an upper endoscopy. The benefits, risks, and potential complications of EGD with possible biopsies were discussed with the patient and she agrees to proceed. Continue daily PPI.   If upper endoscopy unrevealing she may require MRCP for further workup of mild CBD dilation. Labs were normal last week. I will repeat a lipase and LFTs today.

## 2014-03-23 NOTE — Progress Notes (Signed)
Reviewed and agree. If her EGD cannot be done soon, I would try Levsin SL.125 mg tid ,ac .

## 2014-03-23 NOTE — Patient Instructions (Signed)
Please go to the basement lab for blood work today: amylase and lipase You have been scheduled for an endoscopy Please follow the written instructions given to you at your visit today. If you use inhalers (even only as needed), please bring them with you on the day of your procedure. Continue your Prilosec 30 minutes prior to breakfast daily We will contact you after the lab work.  You may need additional testing depending on the results

## 2014-03-24 ENCOUNTER — Encounter: Payer: Self-pay | Admitting: Internal Medicine

## 2014-03-24 ENCOUNTER — Ambulatory Visit (INDEPENDENT_AMBULATORY_CARE_PROVIDER_SITE_OTHER): Payer: Medicare Other | Admitting: Internal Medicine

## 2014-03-24 VITALS — BP 108/50 | HR 93 | Temp 97.3°F | Ht 64.0 in | Wt 178.5 lb

## 2014-03-24 DIAGNOSIS — E1121 Type 2 diabetes mellitus with diabetic nephropathy: Secondary | ICD-10-CM | POA: Insufficient documentation

## 2014-03-24 DIAGNOSIS — IMO0002 Reserved for concepts with insufficient information to code with codable children: Secondary | ICD-10-CM | POA: Insufficient documentation

## 2014-03-24 DIAGNOSIS — IMO0001 Reserved for inherently not codable concepts without codable children: Secondary | ICD-10-CM

## 2014-03-24 DIAGNOSIS — E1165 Type 2 diabetes mellitus with hyperglycemia: Secondary | ICD-10-CM

## 2014-03-24 HISTORY — DX: Reserved for inherently not codable concepts without codable children: IMO0001

## 2014-03-24 MED ORDER — INSULIN ASPART 100 UNIT/ML FLEXPEN: 6.0000 [IU] | PEN_INJECTOR | Freq: Three times a day (TID) | SUBCUTANEOUS | Status: DC

## 2014-03-24 MED ORDER — INSULIN PEN NEEDLE 32G X 4 MM MISC: Status: DC

## 2014-03-24 NOTE — Progress Notes (Signed)
Pre visit review using our clinic review tool, if applicable. No additional management support is needed unless otherwise documented below in the visit note. 

## 2014-03-24 NOTE — Progress Notes (Signed)
Patient ID: Stacey Fuentes, female   DOB: November 17, 1935, 79 y.o.   MRN: [id number]  HPI: Stacey Fuentes is a 79 y.o.-year-old female, referred by her PCP, Dr. Dema Severin, for management of DM2, dx in 1990, insulin-dependent since 1991, uncontrolled, without complications. She is here accompanied by her 2 daughters who offers part of the history.   Last hemoglobin A1c was: 03/14/2014: 10.8%  Pt is on a regimen of: - Onglyza 5 mg daily >> started 1 weeks - Lantus 20 units 2x a day >> 20 units in am - pen  Pt checks her sugars 1-2x a day and they are: - am: 98-385, 529 >> nausea and AP lately - 2h after b'fast: n/c - before lunch: - 2h after lunch: - before dinner: - 2h after dinner: 232 - bedtime: - nighttime: No lows. Lowest sugar was 80 - in am; she has hypoglycemia awareness at 80.  Highest sugar was 500s.  Glucometer: AccuCheck Compact plus - lancets softclix plus  Pt's meals are: - Breakfast: grits, bacon, eggs - Lunch: banana sandwitch - Dinner: soup - Snacks: yoghurt, crackers - 1-2 a day  She exercises by walking and going to the gym.  - no CKD, last BUN/creatinine:  03/13/2014: 18-1 0.07, eGFR 60 Lab Results  Component Value Date   BUN 28* 05/17/2013   CREATININE 0.88 05/17/2013  She is on valsartan 80. - last set of lipids: 03/13/2014: 196/68/63/122. It was recommended that she increases the Lipitor to 40 mg daily.  She is on Lipitor 20. - last eye exam was in 2014. No DR. + cataracts >> h/o sx - no numbness and tingling in her feet.  Pt has FH of DM in 2 sons, 1 daughter.  ROS: Constitutional: no weight gain/loss, + fatigue, no subjective hyperthermia/hypothermia, + poor sleep, + nocturia Eyes: no blurry vision, no xerophthalmia ENT: no sore throat, no nodules palpated in throat, + dysphagia/no odynophagia, no hoarseness, tinnitus +  Cardiovascular: + CP/no SOB/palpitations/leg swelling Respiratory: no cough/SOB Gastrointestinal: + N/no V/D/C, + occasional  heartburn Musculoskeletal: + both: muscle/joint aches Skin: no rashes Neurological: + right hand tremors/no numbness/tingling/dizziness Psychiatric: no depression/anxiety  + low libido   Past Medical History  Diagnosis Date  . Diabetes mellitus   . Cataract   . Benign essential tremor   . Hyperlipidemia   . Wears glasses   . Full dentures    Past Surgical History  Procedure Laterality Date  . Surgery to remove cyst on back  2010  . Appendectomy      childhood  . Colonoscopy      in [place] in 2000  . Incision and drainage abscess Stacey Fuentes 05/18/2013    Procedure: EXCISION OF SEBACEOUS CYST BACK;  Surgeon: Merrie Roof, MD;  Location: [facility];  Service: General;  Laterality: Stacey Fuentes;   History   Social History  . Marital Status: Widowed    Spouse Name: Stacey Fuentes  . Number of Children: 7   Occupational History  . retired    Social History Main Topics  . Smoking status: Never Smoker   . Smokeless tobacco: Never Used  . Alcohol Use: No  . Drug Use: No   Current Outpatient Prescriptions on File Prior to Visit  Medication Sig Dispense Refill  . aspirin 81 MG tablet Take 81 mg by mouth daily.    Marland Kitchen atorvastatin (LIPITOR) 20 MG tablet Take 20 mg by mouth daily.    . calcium carbonate 200 MG capsule Take 1 capsule by mouth  as directed    . insulin glargine (LANTUS SOLOSTAR) 100 UNIT/ML injection Inject 20 Units into the skin 2 (two) times daily.     . Omega-3 Fatty Acids (FISH OIL) 1000 MG CAPS Take 1 capsule by mouth daily.    Marland Kitchen omeprazole (PRILOSEC) 40 MG capsule Take 40 mg by mouth daily.    . saxagliptin HCl (ONGLYZA) 5 MG TABS tablet Take 5 mg by mouth daily.    . valsartan (DIOVAN) 80 MG tablet Take 80 mg by mouth daily.     No current facility-administered medications on file prior to visit.   Allergies  Allergen Reactions  . Metformin And Related Nausea Only   Family History  Problem Relation Age of Onset  . Colon cancer Neg Hx   . Esophageal cancer Neg  Hx   . Rectal cancer Neg Hx   . Stomach cancer Neg Hx    PE: BP 108/50 mmHg  Pulse 93  Temp(Src) 97.3 F (36.3 C) (Oral)  Ht $R'5\' 4"'Ti$  (1.626 m)  Wt 178 lb 8 oz (80.967 kg)  BMI 30.62 kg/m2  SpO2 97% Wt Readings from Last 3 Encounters:  03/24/14 178 lb 8 oz (80.967 kg)  03/23/14 179 lb 6.4 oz (81.375 kg)  06/06/13 178 lb 6.4 oz (80.922 kg)   Constitutional: overweight, in NAD Eyes: PERRLA, EOMI, no exophthalmos ENT: moist mucous membranes, no thyromegaly, no cervical lymphadenopathy Cardiovascular: RRR, No MRG Respiratory: CTA B Gastrointestinal: abdomen soft, NT, ND, BS+ Musculoskeletal: no deformities, strength intact in all 4 Skin: moist, warm, no rashes Neurological: no tremor with outstretched hands, DTR normal in all 4  ASSESSMENT: 1. DM2, insulin-dependent, uncontrolled, without complications  PLAN:  1. Patient with long-standing, uncontrolled diabetes, on oral antidiabetic regimen + basal insulin, which is insufficient. Patient has large fluctuations in her sugars, from the 90s to 500s. Her sugars were higher in the last week due to patient feeling sick, with nausea and abdominal pain. A lipase was checked yesterday and was not increased. - We discussed about options for treatment with patient and her 2 daughters who accompany her today, and I suggested to add mealtime insulin to be able to better control the fluctuations in her sugars:  Patient Instructions  Please continue Lantus 20 units and move this at bedtime.  Start NovoLog insulin 15 min before each main meal: 6 units for a smaller meal 8 units for a larger meal  Stop Onglyza.  Please let me know if the sugars are consistently <80 or >200.  Please return in 1 month with your sugar log.  - Since we are adding mealtime insulin, I advised her to stop Onglyza - Strongly advised her to start checking sugars at different times of the day - check 2 times a day, rotating checks - given sugar log and advised how to  fill it and to bring it at next appt  - given foot care handout and explained the principles  - given instructions for hypoglycemia management "15-15 rule"  - advised for yearly eye exams >> She needs a new one - Return to clinic in 1 mo with sugar log

## 2014-03-24 NOTE — Patient Instructions (Signed)
Please continue Lantus 20 units and move this at bedtime.  Start NovoLog insulin 15 min before each main meal: 6 units for a smaller meal 8 units for a larger meal  Stop Onglyza.  Please let me know if the sugars are consistently <80 or >200.  Please return in 1 month with your sugar log.   PATIENT INSTRUCTIONS FOR TYPE 2 DIABETES:  **Please join MyChart!** - see attached instructions about how to join if you have not done so already.  DIET AND EXERCISE Diet and exercise is an important part of diabetic treatment.  We recommended aerobic exercise in the form of brisk walking (working between 40-60% of maximal aerobic capacity, similar to brisk walking) for 150 minutes per week (such as 30 minutes five days per week) along with 3 times per week performing 'resistance' training (using various gauge rubber tubes with handles) 5-10 exercises involving the major muscle groups (upper body, lower body and core) performing 10-15 repetitions (or near fatigue) each exercise. Start at half the above goal but build slowly to reach the above goals. If limited by weight, joint pain, or disability, we recommend daily walking in a swimming pool with water up to waist to reduce pressure from joints while allow for adequate exercise.    BLOOD GLUCOSES Monitoring your blood glucoses is important for continued management of your diabetes. Please check your blood glucoses 2-4 times a day: fasting, before meals and at bedtime (you can rotate these measurements - e.g. one day check before the 3 meals, the next day check before 2 of the meals and before bedtime, etc.).   HYPOGLYCEMIA (low blood sugar) Hypoglycemia is usually a reaction to not eating, exercising, or taking too much insulin/ other diabetes drugs.  Symptoms include tremors, sweating, hunger, confusion, headache, etc. Treat IMMEDIATELY with 15 grams of Carbs: . 4 glucose tablets .  cup regular juice/soda . 2 tablespoons raisins . 4 teaspoons  sugar . 1 tablespoon honey Recheck blood glucose in 15 mins and repeat above if still symptomatic/blood glucose <100.  RECOMMENDATIONS TO REDUCE YOUR RISK OF DIABETIC COMPLICATIONS: * Take your prescribed MEDICATION(S) * Follow a DIABETIC diet: Complex carbs, fiber rich foods, (monounsaturated and polyunsaturated) fats * AVOID saturated/trans fats, high fat foods, >2,300 mg salt per day. * EXERCISE at least 5 times a week for 30 minutes or preferably daily.  * DO NOT SMOKE OR DRINK more than 1 drink a day. * Check your FEET every day. Do not wear tightfitting shoes. Contact us if you develop an ulcer * See your EYE doctor once a year or more if needed * Get a FLU shot once a year * Get a PNEUMONIA vaccine once before and once after age 62 years  GOALS:  * Your Hemoglobin A1c of <7%  * fasting sugars need to be <130 * after meals sugars need to be <180 (2h after you start eating) * Your Systolic BP should be 315 or lower  * Your Diastolic BP should be 80 or lower  * Your HDL (Good Cholesterol) should be 40 or higher  * Your LDL (Bad Cholesterol) should be 100 or lower. * Your Triglycerides should be 150 or lower  * Your Urine microalbumin (kidney function) should be <30 * Your Body Mass Index should be 25 or lower    Please consider the following ways to cut down carbs and fat and increase fiber and micronutrients in your diet: - substitute whole grain for white bread or pasta - substitute  brown rice for white rice - substitute 90-calorie flat bread pieces for slices of bread when possible - substitute sweet potatoes or yams for white potatoes - substitute humus for margarine - substitute tofu for cheese when possible - substitute almond or rice milk for regular milk (would not drink soy milk daily due to concern for soy estrogen influence on breast cancer risk) - substitute dark chocolate for other sweets when possible - substitute water - can add lemon or orange slices for taste  - for diet sodas (artificial sweeteners will trick your body that you can eat sweets without getting calories and will lead you to overeating and weight gain in the long run) - do not skip breakfast or other meals (this will slow down the metabolism and will result in more weight gain over time)  - can try smoothies made from fruit and almond/rice milk in am instead of regular breakfast - can also try old-fashioned (not instant) oatmeal made with almond/rice milk in am - order the dressing on the side when eating salad at a restaurant (pour less than half of the dressing on the salad) - eat as little meat as possible - can try juicing, but should not forget that juicing will get rid of the fiber, so would alternate with eating raw veg./fruits or drinking smoothies - use as little oil as possible, even when using olive oil - can dress a salad with a mix of balsamic vinegar and lemon juice, for e.g. - use agave nectar, stevia sugar, or regular sugar rather than artificial sweateners - steam or broil/roast veggies  - snack on veggies/fruit/nuts (unsalted, preferably) when possible, rather than processed foods - reduce or eliminate aspartame in diet (it is in diet sodas, chewing gum, etc) Read the labels!  Try to read Dr. Janene Harvey book: "Program for Reversing Diabetes" for other ideas for healthy eating.

## 2014-03-29 ENCOUNTER — Telehealth: Payer: Self-pay | Admitting: *Deleted

## 2014-03-29 ENCOUNTER — Ambulatory Visit (AMBULATORY_SURGERY_CENTER): Payer: Medicare Other | Admitting: Internal Medicine

## 2014-03-29 ENCOUNTER — Other Ambulatory Visit (INDEPENDENT_AMBULATORY_CARE_PROVIDER_SITE_OTHER): Payer: Medicare Other

## 2014-03-29 ENCOUNTER — Other Ambulatory Visit: Payer: Self-pay | Admitting: *Deleted

## 2014-03-29 ENCOUNTER — Encounter: Payer: Self-pay | Admitting: Internal Medicine

## 2014-03-29 VITALS — BP 151/88 | HR 80 | Temp 96.8°F | Resp 17 | Ht 64.0 in | Wt 179.0 lb

## 2014-03-29 DIAGNOSIS — R1011 Right upper quadrant pain: Principal | ICD-10-CM

## 2014-03-29 DIAGNOSIS — R109 Unspecified abdominal pain: Secondary | ICD-10-CM

## 2014-03-29 DIAGNOSIS — G8929 Other chronic pain: Secondary | ICD-10-CM

## 2014-03-29 DIAGNOSIS — B9681 Helicobacter pylori [H. pylori] as the cause of diseases classified elsewhere: Secondary | ICD-10-CM

## 2014-03-29 DIAGNOSIS — R1013 Epigastric pain: Secondary | ICD-10-CM

## 2014-03-29 DIAGNOSIS — K2970 Gastritis, unspecified, without bleeding: Secondary | ICD-10-CM

## 2014-03-29 LAB — BUN: BUN: 20 mg/dL (ref 6–23)

## 2014-03-29 LAB — CREATININE, SERUM: CREATININE: 0.92 mg/dL (ref 0.40–1.20)

## 2014-03-29 LAB — GLUCOSE, CAPILLARY: Glucose-Capillary: 203 mg/dL — ABNORMAL HIGH (ref 70–99)

## 2014-03-29 MED ORDER — SODIUM CHLORIDE 0.9 % IV SOLN: 500.0000 mL | INTRAVENOUS | Status: DC

## 2014-03-29 NOTE — Patient Instructions (Signed)

## 2014-03-29 NOTE — Op Note (Signed)
[facility]  [address]. [place], [id number]   ENDOSCOPY PROCEDURE REPORT  PATIENT: Stacey, Fuentes  MR#: [id number] BIRTHDATE: 1935/05/05 , 78  yrs. old GENDER: female ENDOSCOPIST: Lafayette Dragon, MD REFERRED BY:  Harlan Stains, M.D. PROCEDURE DATE:  03/29/2014 PROCEDURE:  EGD w/ biopsy ASA CLASS:     Class II INDICATIONS:  epigastric pain and Epigastric pain not responsive to omeprazole.  Upper abdominal ultrasound shows some enlarged common bile duct to 10 mm. MEDICATIONS: Monitored anesthesia care and Propofol 100 mg IV TOPICAL ANESTHETIC: none  DESCRIPTION OF PROCEDURE: After the risks benefits and alternatives of the procedure were thoroughly explained, informed consent was obtained.  The LB [id number] endoscope was introduced through the mouth and advanced to the second portion of the duodenum , Without limitations.  The instrument was slowly withdrawn as the mucosa was fully examined.    Esophagus: proximal mid and distal esophageal mucosa appeared normal. The squamocolumnar junction was regular. There was no stricture, There was  2 cm  hiatal hernia which was reducible  Stomach: There was mild nonspecific erythema of the gastric antrum and the body. Biopsies were obtained to rule out H. pylori. The rugal pattern was normal. There was no retention of the food or acid. Retroflexion of the endoscope revealed normal fundus and cardia  Duodenum: duodenal bulb and descending duodenum was normal[ The scope was then withdrawn from the patient and the procedure completed.  COMPLICATIONS: There were no immediate complications.  ENDOSCOPIC IMPRESSION: minimal antral gastritis. Status post biopsies. Nothing to account for abdominal pain  RECOMMENDATIONS: Await pathology results continue omeprazole schedule MRCP to evaluate the dilated common bile duct   REPEAT EXAM:  eSigned:  Lafayette Dragon, MD 03/29/2014 1:48 PM    CC:  PATIENT NAME:   Stacey, Fuentes MR#: [id number]

## 2014-03-29 NOTE — Progress Notes (Signed)
A/ox3, pleased with MAC, report to RN 

## 2014-03-29 NOTE — Progress Notes (Signed)
Called to room to assist during endoscopic procedure.  Patient ID and intended procedure confirmed with present staff. Received instructions for my participation in the procedure from the performing physician.  

## 2014-03-29 NOTE — Telephone Encounter (Signed)
Per Dr. Olevia Perches, needs MRCP. Scheduled at [facility] radiology(Peggy) on 04/10/14 at 3:00 PM. NPO 4 hours prior. Needs BUN, Creatinine today. Spoke with Abigail Butts at [facility] recovery and gave her appointment, instructions for patient. She will send patient to lab also. Labs in EPIC.

## 2014-03-30 ENCOUNTER — Telehealth: Payer: Self-pay | Admitting: *Deleted

## 2014-03-30 NOTE — Telephone Encounter (Signed)
  Follow up Call-  Call back number 03/29/2014 03/12/2012  Post procedure Call Back phone  # [phone]  Permission to leave phone message Yes Yes     Patient questions:  Do you have a fever, pain , or abdominal swelling? No. Pain Score  0 *  Have you tolerated food without any problems? Yes.    Have you been able to return to your normal activities? Yes.    Do you have any questions about your discharge instructions: Diet   No. Medications  No. Follow up visit  No.  Do you have questions or concerns about your Care? No.  Actions: * If pain score is 4 or above: No action needed, pain <4.

## 2014-04-03 ENCOUNTER — Other Ambulatory Visit: Payer: Self-pay | Admitting: *Deleted

## 2014-04-03 MED ORDER — INSULIN LISPRO 100 UNIT/ML (KWIKPEN): 6.0000 [IU] | PEN_INJECTOR | Freq: Three times a day (TID) | SUBCUTANEOUS | Status: DC

## 2014-04-03 NOTE — Telephone Encounter (Signed)
Switching from Novolog flexpen to Humalog kwikpen (formulary alternative per insurance).

## 2014-04-05 ENCOUNTER — Encounter: Payer: Self-pay | Admitting: Internal Medicine

## 2014-04-06 ENCOUNTER — Telehealth: Payer: Self-pay | Admitting: *Deleted

## 2014-04-06 MED ORDER — OMEPRAZOLE 40 MG PO CPDR: DELAYED_RELEASE_CAPSULE | ORAL | Status: DC

## 2014-04-06 MED ORDER — METRONIDAZOLE 250 MG PO TABS: ORAL_TABLET | ORAL | Status: DC

## 2014-04-06 MED ORDER — CLARITHROMYCIN 500 MG PO TABS: ORAL_TABLET | ORAL | Status: DC

## 2014-04-06 NOTE — Telephone Encounter (Signed)
Yes, please. Sorry I did not let you know.

## 2014-04-06 NOTE — Telephone Encounter (Signed)
Patient given results of biopsy and recommendations. Rx's have been sent.

## 2014-04-06 NOTE — Telephone Encounter (Signed)
Left a message for patient to call back. 

## 2014-04-06 NOTE — Telephone Encounter (Signed)
Patient's letter with biopsy results states she has h. Pylori. Would you like her to have Flagyl 250 mg QID, Clarithromycin 500 mg BID and PPI BID for 10 days?

## 2014-04-10 ENCOUNTER — Other Ambulatory Visit: Payer: Self-pay | Admitting: Internal Medicine

## 2014-04-10 ENCOUNTER — Ambulatory Visit (HOSPITAL_COMMUNITY)
Admission: RE | Admit: 2014-04-10 | Discharge: 2014-04-10 | Disposition: A | Discharge status: Home / Self Care | Payer: Medicare Other | Source: Ambulatory Visit | Attending: Internal Medicine | Admitting: Internal Medicine

## 2014-04-10 DIAGNOSIS — R109 Unspecified abdominal pain: Secondary | ICD-10-CM

## 2014-04-10 DIAGNOSIS — K838 Other specified diseases of biliary tract: Secondary | ICD-10-CM | POA: Diagnosis not present

## 2014-04-10 MED ORDER — GADOBENATE DIMEGLUMINE 529 MG/ML IV SOLN
20.0000 mL | Freq: Once | INTRAVENOUS | Status: AC | PRN
Start: 2014-04-10 — End: 2014-04-10
  Administered 2014-04-10: 16 mL via INTRAVENOUS

## 2014-04-25 ENCOUNTER — Encounter: Payer: Self-pay | Admitting: Internal Medicine

## 2014-04-25 ENCOUNTER — Ambulatory Visit (INDEPENDENT_AMBULATORY_CARE_PROVIDER_SITE_OTHER): Payer: Medicare Other | Admitting: Internal Medicine

## 2014-04-25 VITALS — BP 104/66 | HR 90 | Temp 97.9°F | Resp 12 | Wt 180.8 lb

## 2014-04-25 DIAGNOSIS — E1165 Type 2 diabetes mellitus with hyperglycemia: Secondary | ICD-10-CM

## 2014-04-25 DIAGNOSIS — IMO0001 Reserved for inherently not codable concepts without codable children: Secondary | ICD-10-CM

## 2014-04-25 NOTE — Patient Instructions (Signed)
Please increase Lantus to 20 units at bedtime.  Continue NovoLog insulin 15 min before each main meal: 6 units for a smaller meal 8 units for a larger meal  Please let me know if the sugars are consistently <80 or >200.  Please return in 1 month with your sugar log.

## 2014-04-25 NOTE — Progress Notes (Signed)
Patient ID: Sharhonda Atwood, female   DOB: Jan 31, 1936, 79 y.o.   MRN: [id number]  HPI: Mikenna Bunkley is a 79 y.o.-year-old female, referred by her PCP, Dr. Dema Severin, for management of DM2, dx in 1990, insulin-dependent since 1991, uncontrolled, without complications. She is here accompanied by her daughter who offers part of the history. Last visit 1 mo ago.  Last hemoglobin A1c was: 03/14/2014: 10.8%  Pt is on a regimen of: - Lantus (pen) 20 units 2x a day >> 20 units in am >> now 16 units (this dose was recommended to PCP before our last visit, and she did not increase the dose to 20 as I advised her last - she forgot ) - NovoLog insulin 15 min before each main meal: 6 units for a smaller meal 8 units for a larger meal We stopped Onglyza at last visit.  Pt checks her sugars 1-2x a day and they are - no log, no meter, cannot remember values: - am: 98-385, 529 >> nausea and AP lately >> low 200s - 2h after b'fast: n/c  - before lunch: - 2h after lunch: - before dinner: - 2h after dinner: 232 >> ? - bedtime: - nighttime: No lows. Lowest sugar was 80 - in am >> now 130; she has hypoglycemia awareness at 80.  Highest sugar was 500s >> 320.  Glucometer: AccuCheck Compact plus - lancets softclix plus  Pt's meals are: - Breakfast: grits, bacon, eggs - Lunch: banana sandwitch - Dinner: soup - Snacks: yoghurt, crackers - 1-2 a day  She exercises by walking and going to the gym.  - no CKD, last BUN/creatinine:  03/13/2014: 18-1 0.07, eGFR 60 Lab Results  Component Value Date   BUN 20 03/29/2014   CREATININE 0.92 03/29/2014  She is on valsartan 80. - last set of lipids: 03/13/2014: 196/68/63/122. It was recommended that she increases the Lipitor to 40 mg daily.  She is on Lipitor 20. - last eye exam was in 2014. No DR. + cataracts >> h/o sx - no numbness and tingling in her feet.  ROS: Constitutional: no weight gain/loss, + fatigue, + decreased appetite, no subjective  hyperthermia/hypothermia, + nocturia Eyes: + blurry vision, no xerophthalmia ENT: no sore throat, no nodules palpated in throat, + dysphagia/no odynophagia, no hoarseness, tinnitus +  Cardiovascular: no CP/+ SOB with exertion/no palpitations/leg swelling Respiratory: no cough/+ SOB Gastrointestinal: + N -improved /no V/D/C, + occasional heartburn Musculoskeletal: no muscle/joint aches Skin: no rashes, itching +  Neurological: + right hand  pill-rolling tremors/no numbness/tingling/dizziness  I reviewed pt's medications, allergies, PMH, social hx, family hx, and changes were documented in the history of present illness. Otherwise, unchanged from my initial visit note:  Past Medical History  Diagnosis Date  . Diabetes mellitus   . Cataract   . Benign essential tremor   . Hyperlipidemia   . Wears glasses   . Full dentures   . Uncontrolled diabetes mellitus type 2 without complications 1/49/7026   Past Surgical History  Procedure Laterality Date  . Surgery to remove cyst on back  2010  . Appendectomy      childhood  . Colonoscopy      in [place] in 2000  . Incision and drainage abscess N/A 05/18/2013    Procedure: EXCISION OF SEBACEOUS CYST BACK;  Surgeon: Merrie Roof, MD;  Location: [facility];  Service: General;  Laterality: N/A;   History   Social History  . Marital Status: Widowed    Spouse Name:  N/A  . Number of Children: 7   Occupational History  . retired    Social History Main Topics  . Smoking status: Never Smoker   . Smokeless tobacco: Never Used  . Alcohol Use: No  . Drug Use: No   Current Outpatient Prescriptions on File Prior to Visit  Medication Sig Dispense Refill  . aspirin 81 MG tablet Take 81 mg by mouth daily.    Marland Kitchen atorvastatin (LIPITOR) 20 MG tablet Take 20 mg by mouth daily.    . calcium carbonate 200 MG capsule Take 1 capsule by mouth as directed    . clarithromycin (BIAXIN) 500 MG tablet Take one BID x 10 days 30 tablet 0  .  insulin glargine (LANTUS SOLOSTAR) 100 UNIT/ML injection Inject 20 Units into the skin 2 (two) times daily.     . insulin lispro (HUMALOG KWIKPEN) 100 UNIT/ML KiwkPen Inject 0.06-0.08 mLs (6-8 Units total) into the skin 3 (three) times daily with meals. 15 mL 11  . Insulin Pen Needle (CAREFINE PEN NEEDLES) 32G X 4 MM MISC Use 4x a day 200 each 2  . metroNIDAZOLE (FLAGYL) 250 MG tablet Take one po QID x 10 days 40 tablet 0  . Omega-3 Fatty Acids (FISH OIL) 1000 MG CAPS Take 1 capsule by mouth daily.    Marland Kitchen omeprazole (PRILOSEC) 40 MG capsule Take one po BID x 10 days then daily 40 capsule 2  . saxagliptin HCl (ONGLYZA) 5 MG TABS tablet Take 5 mg by mouth daily.    . valsartan (DIOVAN) 80 MG tablet Take 80 mg by mouth daily.     No current facility-administered medications on file prior to visit.   Allergies  Allergen Reactions  . Metformin And Related Nausea Only   Family History  Problem Relation Age of Onset  . Colon cancer Neg Hx   . Esophageal cancer Neg Hx   . Rectal cancer Neg Hx   . Stomach cancer Neg Hx    PE: BP 104/66 mmHg  Pulse 90  Temp(Src) 97.9 F (36.6 C) (Oral)  Resp 12  Wt 180 lb 12.8 oz (82.01 kg)  SpO2 96% Wt Readings from Last 3 Encounters:  04/25/14 180 lb 12.8 oz (82.01 kg)  04/10/14 180 lb (81.647 kg)  03/29/14 179 lb (81.194 kg)   Constitutional: overweight, in NAD Eyes: PERRLA, EOMI, no exophthalmos ENT: moist mucous membranes, no thyromegaly, no cervical lymphadenopathy Cardiovascular: RRR, No MRG Respiratory: CTA B Gastrointestinal: abdomen soft, NT, ND, BS+ Musculoskeletal: no deformities, strength intact in all 4 Skin: moist, warm, no rashes Neurological: no tremor with outstretched hands, DTR normal in all 4  ASSESSMENT: 1. DM2, insulin-dependent, uncontrolled, without complications  PLAN:  1. Patient with long-standing, uncontrolled diabetes, on basal-bolus insulin regimen. She does not bring her sugar log or a meter, and she cannot  remember the value is of her CBGs. It is difficult to change the doses of her insulin in this situation. However, since her sugars in the morning are still in the 200s, I will increase the Lantus dose from 16 to 20 units. She is not sure why she did not increase Lantus to 20 as I advised her last time. I cannot change the doses of NovoLog at this time, but will have her back with her log in a month to reevaluate.  Patient Instructions  Please increase Lantus to 20 units at bedtime.  Continue NovoLog insulin 15 min before each main meal: 6 units for a smaller meal 8 units  for a larger meal  Please let me know if the sugars are consistently <80 or >200.  Please return in 1 month with your sugar log.   - continue checking sugars at different times of the day - check 2-3 times a day, rotating checks >> need log at next visit! - advised for yearly eye exams >> She needs a new one - Return to clinic in 1 mo with sugar log

## 2014-06-09 ENCOUNTER — Ambulatory Visit: Payer: Medicare Other | Admitting: Internal Medicine

## 2014-10-24 ENCOUNTER — Other Ambulatory Visit: Payer: Self-pay

## 2014-10-24 DIAGNOSIS — Z1231 Encounter for screening mammogram for malignant neoplasm of breast: Secondary | ICD-10-CM

## 2014-11-27 ENCOUNTER — Ambulatory Visit
Admission: RE | Admit: 2014-11-27 | Discharge: 2014-11-27 | Disposition: A | Discharge status: Home / Self Care | Payer: Medicare Other | Source: Ambulatory Visit

## 2014-11-27 DIAGNOSIS — Z1231 Encounter for screening mammogram for malignant neoplasm of breast: Secondary | ICD-10-CM

## 2015-03-22 ENCOUNTER — Ambulatory Visit: Payer: Medicaid Other | Admitting: Internal Medicine

## 2015-05-18 ENCOUNTER — Other Ambulatory Visit: Payer: Self-pay | Admitting: Internal Medicine

## 2015-07-24 ENCOUNTER — Other Ambulatory Visit: Payer: Self-pay | Admitting: Internal Medicine

## 2015-09-21 ENCOUNTER — Telehealth: Payer: Self-pay

## 2015-09-21 NOTE — Telephone Encounter (Signed)
Called to see how much insulin patient is currently taking, along with last set of sugars per Dr.Gherghe. Left message with call back number.

## 2015-09-28 ENCOUNTER — Encounter: Payer: Self-pay | Admitting: Internal Medicine

## 2015-09-28 ENCOUNTER — Ambulatory Visit (INDEPENDENT_AMBULATORY_CARE_PROVIDER_SITE_OTHER): Payer: Medicare Other | Admitting: Internal Medicine

## 2015-09-28 VITALS — BP 128/70 | HR 92 | Ht 64.0 in | Wt 175.0 lb

## 2015-09-28 DIAGNOSIS — IMO0002 Reserved for concepts with insufficient information to code with codable children: Secondary | ICD-10-CM

## 2015-09-28 DIAGNOSIS — E1121 Type 2 diabetes mellitus with diabetic nephropathy: Secondary | ICD-10-CM

## 2015-09-28 DIAGNOSIS — E1165 Type 2 diabetes mellitus with hyperglycemia: Secondary | ICD-10-CM | POA: Diagnosis not present

## 2015-09-28 NOTE — Progress Notes (Signed)
Patient ID: Stacey Fuentes, female   DOB: 02/09/1936, 80 y.o.   MRN: [id number]  HPI: Stacey Fuentes is a 80 y.o.-year-old female, initially referred by her PCP, Dr. Dema Severin, for management of DM2, dx in 1990, insulin-dependent since 1991, uncontrolled, with complications (nephropathy)., dx in 1990, insulin-dependent since 1991, uncontrolled, with complications (nephropathy). She is here accompanied by her daughter who offers part of the history. Last visit 1.5 years ago.  Last hemoglobin A1c was: 03/14/2014: 10.8%  Pt is on a regimen of: - Lantus (pen) 18 units  - NovoLog insulin 15 min before each main meal: 6 units for a smaller meal 8 units for a larger meal (only if sugars >200 before a meal) We stopped Onglyza before.  Pt checks her sugars 1-2x a day - unfortunately, again no log, no meter, she and daughetr cannot remember values: - am: 98-385, 529 >> nausea and AP lately >> low 200s >> 175 x1 yesterday - 2h after b'fast: n/c >> 500s (after shredded wheat) - before lunch: - 2h after lunch: - before dinner: - 2h after dinner: 232 >> 75 x1  - bedtime: - nighttime: No lows. Lowest sugar was 80 - in am >> 130 >> ?; she has hypoglycemia awareness at 80.  Highest sugar was 500s >> 320 >> 500s.  Glucometer: AccuCheck Compact plus - lancets softclix plus  Pt changed her diet >> cut out sodas, starches, sweets.  - no CKD, last BUN/creatinine:  08/27/2015:  29/1.16, eGFR 55 03/13/2014: 18/1.07, eGFR 60 Lab Results  Component Value Date   BUN 20 03/29/2014   CREATININE 0.92 03/29/2014  She is on valsartan 80. - last set of lipids: 12/27/2014: 245/158/60/154 03/13/2014: 196/68/63/122.  She is on Lipitor 20. - last eye exam was in Spring 2017. No DR. + cataracts >> h/o sx - no numbness and tingling in her feet.  ROS: Constitutional: no weight gain/loss, no fatigue, no subjective hyperthermia/hypothermia Eyes: no blurry vision, no xerophthalmia ENT: no sore throat, no nodules palpated in throat, no dysphagia/odynophagia, no hoarseness Cardiovascular: no  CP/SOB/palpitations/leg swelling Respiratory: no cough/SOB Gastrointestinal: no N/V/D/C Musculoskeletal: no muscle/+ joint aches Skin: no rashes Neurological: + tremors/no numbness/tingling/dizziness  I reviewed pt's medications, allergies, PMH, social hx, family hx, and changes were documented in the history of present illness. Otherwise, unchanged from my initial visit note:  Past Medical History:  Diagnosis Date  . Benign essential tremor   . Cataract   . Diabetes mellitus ([place])   . Full dentures   . Hyperlipidemia   . Uncontrolled diabetes mellitus type 2 without complications ([place]) 9/38/1829  . Wears glasses    Past Surgical History:  Procedure Laterality Date  . APPENDECTOMY     childhood  . COLONOSCOPY     in [place] in 2000  . INCISION AND DRAINAGE ABSCESS N/A 05/18/2013   Procedure: EXCISION OF SEBACEOUS CYST BACK;  Surgeon: Merrie Roof, MD;  Location: [facility];  Service: General;  Laterality: N/A;  . surgery to remove cyst on back  2010   History   Social History  . Marital Status: Widowed    Spouse Name: N/A  . Number of Children: 7   Occupational History  . retired    Social History Main Topics  . Smoking status: Never Smoker   . Smokeless tobacco: Never Used  . Alcohol Use: No  . Drug Use: No   Current Outpatient Prescriptions on File Prior to Visit  Medication Sig Dispense Refill  . aspirin 81 MG tablet Take 81 mg by mouth daily.    Marland Kitchen  insulin glargine (LANTUS SOLOSTAR) 100 UNIT/ML injection Inject 20 Units into the skin 2 (two) times daily.     . insulin lispro (HUMALOG KWIKPEN) 100 UNIT/ML KiwkPen Inject 0.06-0.08 mLs (6-8 Units total) into the skin 3 (three) times daily. **LAST REFILL**NEEDS APPT FOR FURTHER REFILLS** 15 mL 0  . Insulin Pen Needle (CAREFINE PEN NEEDLES) 32G X 4 MM MISC Use 4x a day 200 each 2  . atorvastatin (LIPITOR) 20 MG tablet Take 20 mg by mouth daily.    . calcium carbonate 200 MG capsule Take 1 capsule by  mouth as directed    . clarithromycin (BIAXIN) 500 MG tablet Take one BID x 10 days (Patient not taking: Reported on 09/28/2015) 30 tablet 0  . metroNIDAZOLE (FLAGYL) 250 MG tablet Take one po QID x 10 days (Patient not taking: Reported on 09/28/2015) 40 tablet 0  . Omega-3 Fatty Acids (FISH OIL) 1000 MG CAPS Take 1 capsule by mouth daily.    Marland Kitchen omeprazole (PRILOSEC) 40 MG capsule Take one po BID x 10 days then daily (Patient not taking: Reported on 09/28/2015) 40 capsule 2  . saxagliptin HCl (ONGLYZA) 5 MG TABS tablet Take 5 mg by mouth daily.    . valsartan (DIOVAN) 80 MG tablet Take 80 mg by mouth daily.     No current facility-administered medications on file prior to visit.    Allergies  Allergen Reactions  . Metformin And Related Nausea Only   Family History  Problem Relation Age of Onset  . Colon cancer Neg Hx   . Esophageal cancer Neg Hx   . Rectal cancer Neg Hx   . Stomach cancer Neg Hx    PE: BP 128/70 (BP Location: Left Arm, Patient Position: Sitting)   Pulse 92   Ht 5' 4" (1.626 m)   Wt 175 lb (79.4 kg)   SpO2 95%   BMI 30.04 kg/m  Wt Readings from Last 3 Encounters:  09/28/15 175 lb (79.4 kg)  04/25/14 180 lb 12.8 oz (82 kg)  04/10/14 180 lb (81.6 kg)   Constitutional: overweight, in NAD Eyes: PERRLA, EOMI, no exophthalmos ENT: moist mucous membranes, no thyromegaly, no cervical lymphadenopathy Cardiovascular: RRR, No MRG Respiratory: CTA B Gastrointestinal: abdomen soft, NT, ND, BS+ Musculoskeletal: no deformities, strength intact in all 4 Skin: moist, warm, no rashes Neurological: + tremor with outstretched hands, DTR normal in all 4  ASSESSMENT: 1. DM2, insulin-dependent, uncontrolled, without complications  PLAN:  1. Patient with long-standing, uncontrolled diabetes, on basal-bolus insulin regimen. She again does not bring her sugar log or a meter, and she cannot remember the values of her CBGs. I again explained that I cannot change the doses of her  insulin in this situation. Advised when to check sugars, given a sugar log >> will need to evaluate in 1-1.5 mo - HbA1c today higher: 11% - I advised her to: Patient Instructions  Please continue Lantus 18 units at bedtime.  Continue NovoLog insulin 15 min before each main meal: 6 units for a smaller meal 8 units for a regular meal  (10 units for large meal: eating out, adding a dessert)  Please schedule an appt with Antonieta Iba with nutrition.  Please return in 1.5 month with your sugar log.    - continue checking sugars at different times of the day - check 2-3 times a day, rotating checks >> need log at next visit! - advised for yearly eye exams >> Sheis UTD - Return to clinic in 1-1.5 mo with  sugar log   Philemon Kingdom, MD PhD [facility] Endocrinology

## 2015-09-28 NOTE — Patient Instructions (Addendum)
Please continue Lantus 18 units at bedtime.  Continue NovoLog insulin 15 min before each main meal: 6 units for a smaller meal 8 units for a regular meal  (10 units for large meal: eating out, adding a dessert)  Please schedule an appt with Antonieta Iba with nutrition.  Please return in 1.5 month with your sugar log.

## 2015-10-25 ENCOUNTER — Ambulatory Visit
Admission: RE | Admit: 2015-10-25 | Discharge: 2015-10-25 | Disposition: A | Discharge status: Home / Self Care | Payer: Medicare Other | Source: Ambulatory Visit | Attending: Family Medicine | Admitting: Family Medicine

## 2015-10-25 ENCOUNTER — Other Ambulatory Visit: Payer: Self-pay | Admitting: Family Medicine

## 2015-10-25 DIAGNOSIS — M546 Pain in thoracic spine: Secondary | ICD-10-CM

## 2016-02-06 ENCOUNTER — Other Ambulatory Visit: Payer: Self-pay | Admitting: Internal Medicine

## 2016-04-20 IMAGING — US US ABDOMEN LIMITED
1 series · 14 of 25 positions shown · non-contrast
Comparison: None.

CLINICAL DATA: One week history of right upper quadrant pain with
nausea ; hyperlipidemia, diabetes.

EXAM:
US ABDOMEN LIMITED - RIGHT UPPER QUADRANT

[Series 1: us abdomen limited · 0.32mm/px · 14 of 58 slices shown]
[im 1/58]
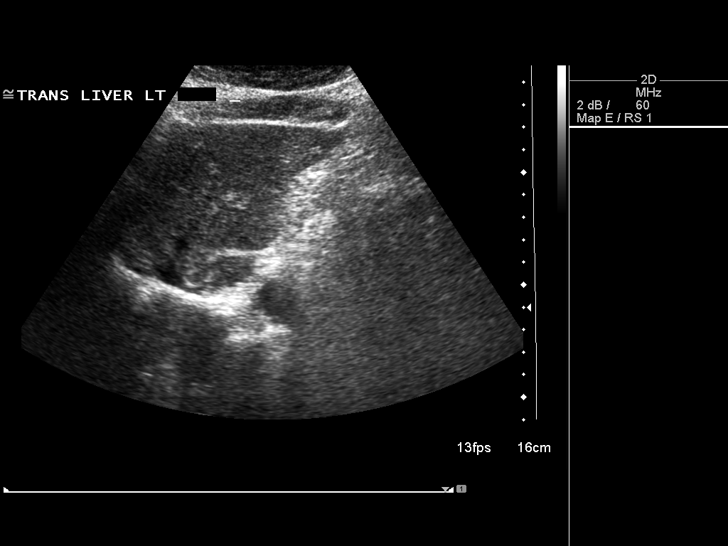
[im 5/58]
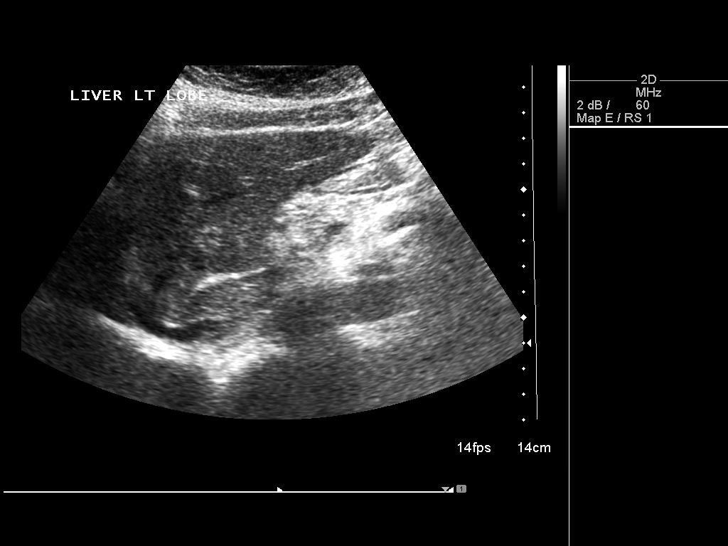
[im 10/58]
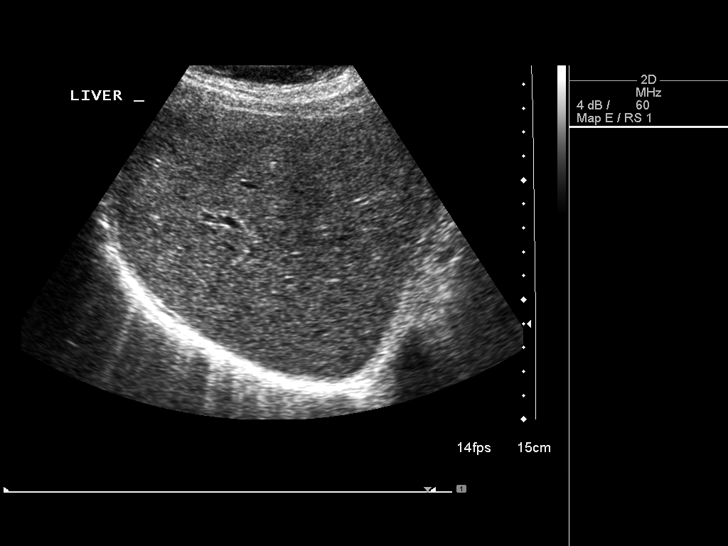
[im 15/58]
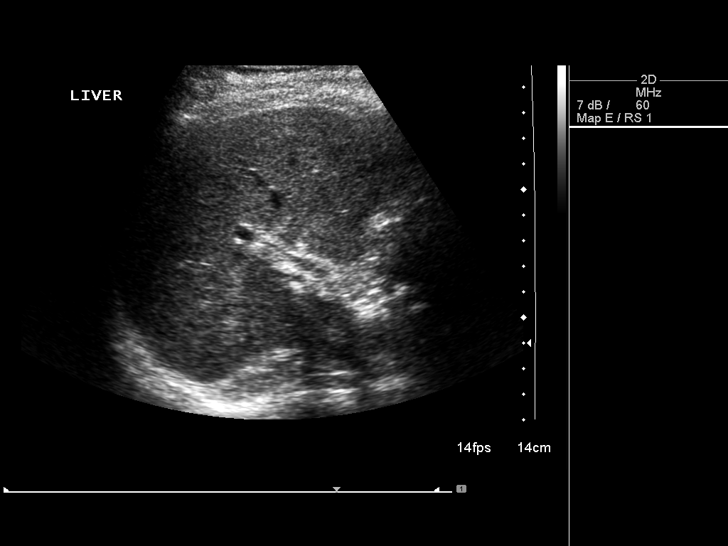
[im 20/58]
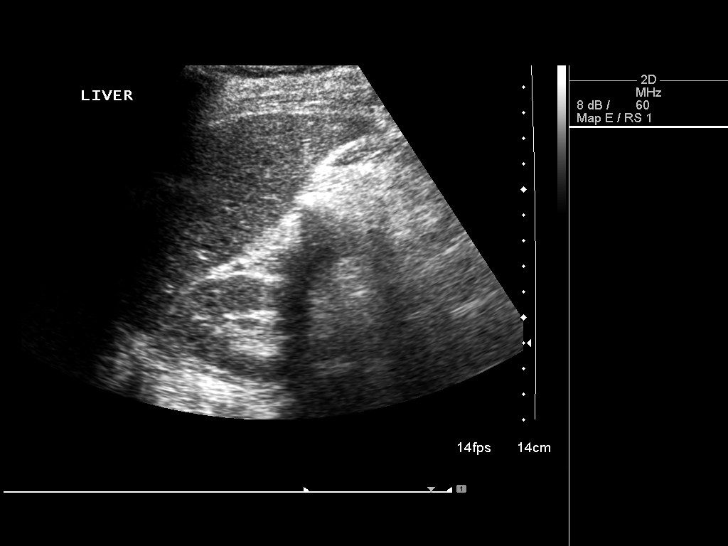
[im 22/58]
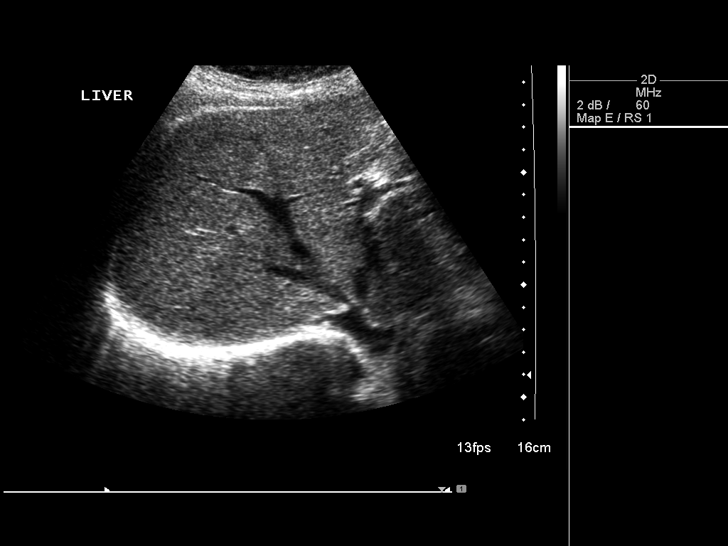
[im 27/58]
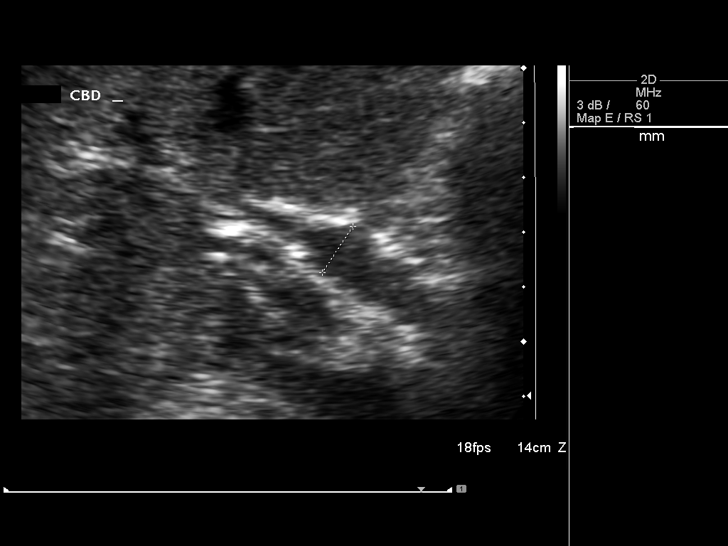
[im 31/58]
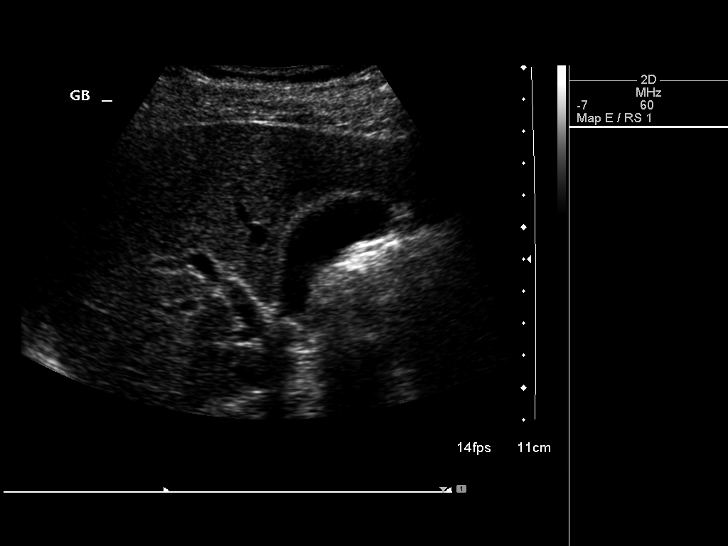
[im 36/58]
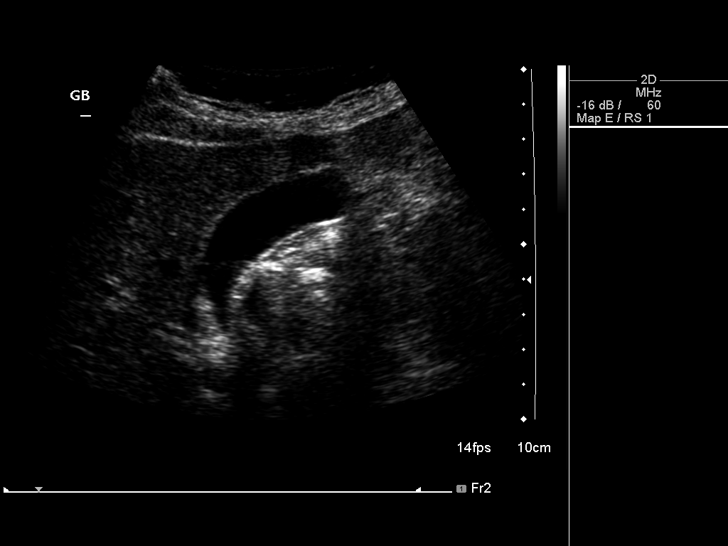
[im 39/58]
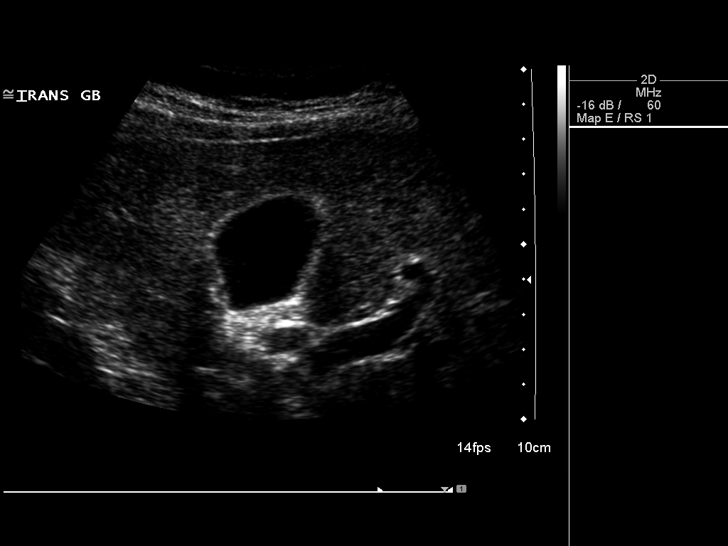
[im 43/58]
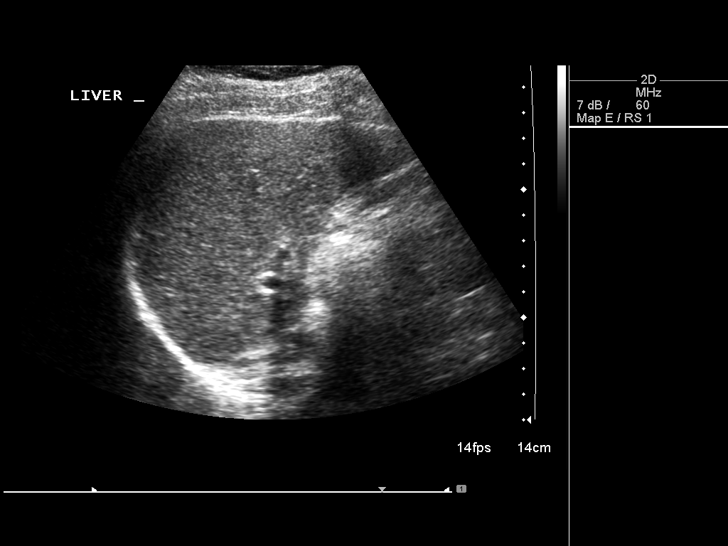
[im 48/58]
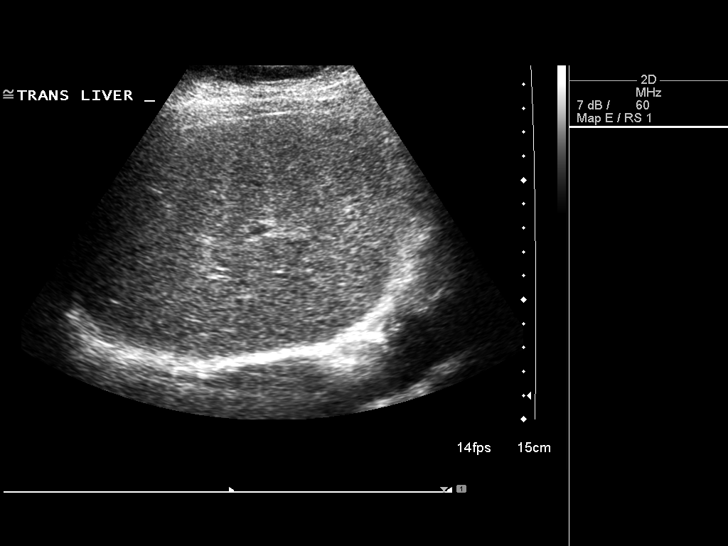
[im 53/58]
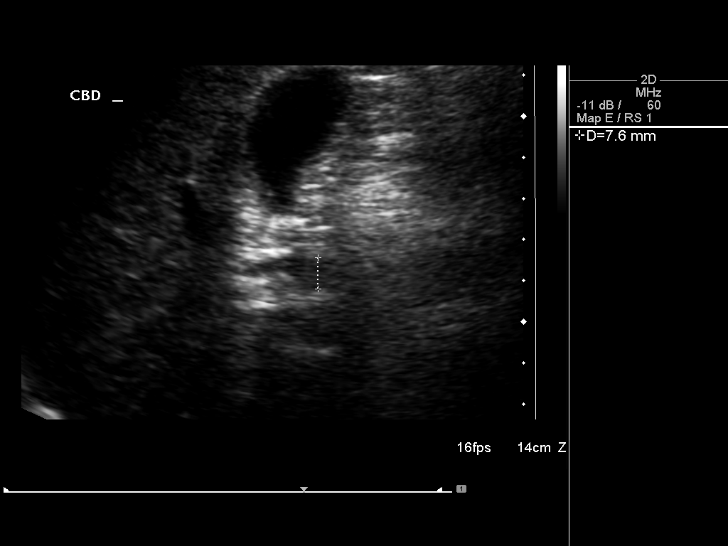
[im 58/58]
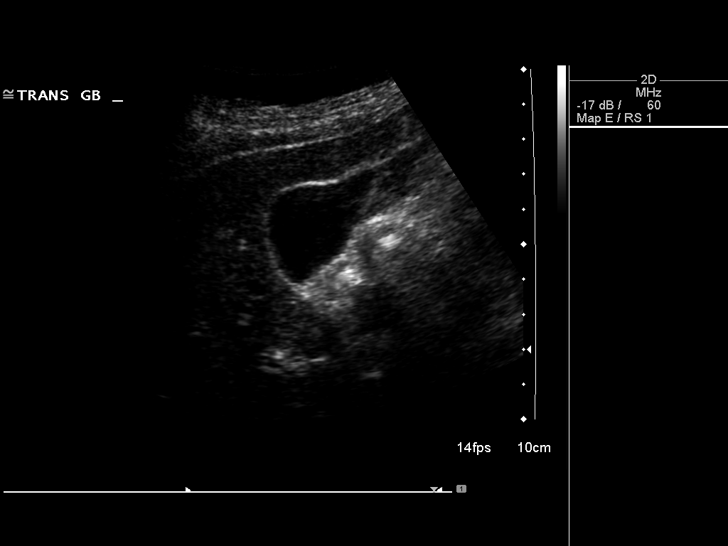

[14 of 25 positions shown; findings below may reference images not displayed]

FINDINGS: Gallbladder:

No gallstones or wall thickening visualized. No sonographic Murphy
sign noted.

Common bile duct:

Diameter: 10.1 mm. No common bile duct stone or mass is
demonstrated.

Liver:

No focal lesion identified. There is no intrahepatic ductal
dilation. Within normal limits in parenchymal echogenicity.
IMPRESSION: Normal appearance of the liver and gallbladder. The common bile duct
is dilated. This is of uncertain etiology. CT scanning of the
abdomen is recommended.

## 2016-06-03 ENCOUNTER — Other Ambulatory Visit: Payer: Self-pay | Admitting: Family Medicine

## 2016-06-03 DIAGNOSIS — M545 Low back pain, unspecified: Secondary | ICD-10-CM

## 2016-07-01 ENCOUNTER — Other Ambulatory Visit: Payer: Self-pay

## 2016-07-01 MED ORDER — INSULIN GLARGINE 100 UNIT/ML ~~LOC~~ SOLN: 20.0000 [IU] | Freq: Two times a day (BID) | SUBCUTANEOUS | 3 refills | Status: DC

## 2016-07-03 ENCOUNTER — Other Ambulatory Visit: Payer: Self-pay

## 2016-07-03 MED ORDER — INSULIN GLARGINE 100 UNIT/ML SOLOSTAR PEN: PEN_INJECTOR | SUBCUTANEOUS | 1 refills | Status: DC

## 2016-07-17 ENCOUNTER — Encounter: Payer: Self-pay | Admitting: Internal Medicine

## 2016-07-17 ENCOUNTER — Telehealth: Payer: Self-pay | Admitting: Internal Medicine

## 2016-07-17 ENCOUNTER — Ambulatory Visit: Payer: Medicare Other | Admitting: Internal Medicine

## 2016-07-17 DIAGNOSIS — Z0289 Encounter for other administrative examinations: Secondary | ICD-10-CM

## 2016-07-17 NOTE — Telephone Encounter (Signed)
3 mo She missed several appts.Marland KitchenMarland Kitchen

## 2016-07-17 NOTE — Telephone Encounter (Signed)
Patient no showed today's appt. Please advise on how to follow up. °A. No follow up necessary. °B. Follow up urgent. Contact patient immediately. °C. Follow up necessary. Contact patient and schedule visit in ___ days. °D. Follow up advised. Contact patient and schedule visit in ____weeks. ° °

## 2016-08-05 NOTE — Telephone Encounter (Signed)
Called lm to call back.pb

## 2017-02-24 ENCOUNTER — Encounter: Payer: Self-pay | Admitting: Internal Medicine

## 2017-03-30 ENCOUNTER — Telehealth: Payer: Self-pay

## 2017-03-30 NOTE — Telephone Encounter (Signed)
Sent referral to scheduling 

## 2017-04-27 ENCOUNTER — Ambulatory Visit: Payer: Medicare Other | Admitting: Internal Medicine

## 2017-04-28 ENCOUNTER — Ambulatory Visit: Payer: Medicare Other | Admitting: Internal Medicine

## 2017-04-28 ENCOUNTER — Other Ambulatory Visit: Payer: Self-pay

## 2017-04-28 ENCOUNTER — Encounter: Payer: Self-pay | Admitting: Internal Medicine

## 2017-04-28 ENCOUNTER — Ambulatory Visit (INDEPENDENT_AMBULATORY_CARE_PROVIDER_SITE_OTHER): Payer: Medicare Other | Admitting: Internal Medicine

## 2017-04-28 VITALS — BP 136/84 | HR 77 | Ht 64.0 in | Wt 166.8 lb

## 2017-04-28 DIAGNOSIS — E785 Hyperlipidemia, unspecified: Secondary | ICD-10-CM

## 2017-04-28 DIAGNOSIS — IMO0002 Reserved for concepts with insufficient information to code with codable children: Secondary | ICD-10-CM

## 2017-04-28 DIAGNOSIS — E1121 Type 2 diabetes mellitus with diabetic nephropathy: Secondary | ICD-10-CM

## 2017-04-28 DIAGNOSIS — E1165 Type 2 diabetes mellitus with hyperglycemia: Secondary | ICD-10-CM | POA: Diagnosis not present

## 2017-04-28 MED ORDER — ACCU-CHEK AVIVA DEVI: 0 refills | Status: AC

## 2017-04-28 MED ORDER — ACCU-CHEK SOFT TOUCH LANCETS MISC: 12 refills | Status: DC

## 2017-04-28 MED ORDER — INSULIN GLARGINE 100 UNIT/ML SOLOSTAR PEN: PEN_INJECTOR | SUBCUTANEOUS | 3 refills | Status: DC

## 2017-04-28 MED ORDER — GLUCOSE BLOOD VI STRP: ORAL_STRIP | 12 refills | Status: DC

## 2017-04-28 MED ORDER — INSULIN REGULAR HUMAN 100 UNIT/ML IJ SOLN: 6.0000 [IU] | Freq: Three times a day (TID) | INTRAMUSCULAR | 11 refills | Status: DC

## 2017-04-28 MED ORDER — "INSULIN SYRINGE-NEEDLE U-100 31G X 15/64"" 0.5 ML MISC": 5 refills | Status: DC

## 2017-04-28 MED ORDER — ACCU-CHEK SOFTCLIX LANCETS MISC: 5 refills | Status: DC

## 2017-04-28 NOTE — Patient Instructions (Addendum)
Please decrease: - Lantus 20 units at bedtime  Please add: - Regular insulin 30 min before each main meal: 6 units before a smaller meal 8 units before a regular meal  (10 units for large meal: eating out, adding a dessert)  Please return in 1.5 month with your sugar log.

## 2017-04-28 NOTE — Progress Notes (Addendum)
Patient ID: Stacey Fuentes, female   DOB: 1935/10/24, 82 y.o.   MRN: [id number]  HPI: Stacey Fuentes is a 82 y.o.-year-old female, initially referred by her PCP, Dr. Dema Severin, for management of DM2, dx in 1990, insulin-dependent since 1991, uncontrolled, with complications (nephropathy).  She again returns after a long absence, of 1 year and 7 months. She is here with her daughter who offers most of the hx, especially regarding her insulin doses and CBG values.  Last hemoglobin A1c was: 03/27/2017: HbA1c 12.6% No results found for: HGBA1C  03/14/2014: 10.8%  Pt is on a regimen of: - Lantus 18 units at bedtime >> 20 units 2x a day - NovoLog 15 min before each main meal: >> stopped b/c leg cramps  We stopped Onglyza before.  Pt checks her sugars 1-2 times a day-unfortunately she again does not bring her log or meter and she cannot remember the sugar values from home - no meter right now: - am: low 200s >> 175 x1 yesterday >> 99-400s - 2h after b'fast: n/c >> 500s >> n/c - before lunch: - 2h after lunch: - before dinner: - 2h after dinner: 232 >> 75 x1  - bedtime: - nighttime: Lowest sugar was 80 >> 130 >> 90s; she has hypoglycemia awareness at 82.  Highest sugar was 500s >> 320 >> 500s >> 400s  Glucometer: AccuCheck Compact plus - lancets softclix plus  -No CKD, last BUN/creatinine:  03/27/2017: Glu 155, 20/0.93, eGFR 70 08/27/2015:  29/1.16, eGFR 55 03/13/2014: 18/1.07, eGFR 60 Lab Results  Component Value Date   BUN 20 03/29/2014   CREATININE 0.92 03/29/2014  Not taking the valsartan 80... -+ HL; last set of lipids: 03/27/2017: 228/74/71/143 12/27/2014: 245/158/60/154 03/13/2014: 196/68/63/122.  Not taking the Lipitor 20... - last eye exam was in 01/2017: ? DR. + cataracts >> h/o sx - no numbness and tingling in her feet.  03/27/2017:TSH 1.04  ROS: Constitutional: no weight gain/no weight loss, no fatigue, no subjective hyperthermia, no subjective hypothermia Eyes: no  blurry vision, no xerophthalmia ENT: no sore throat, no nodules palpated in throat, no dysphagia, no odynophagia, no hoarseness Cardiovascular: no CP/no SOB/no palpitations/no leg swelling Respiratory: no cough/no SOB/no wheezing Gastrointestinal: no N/no V/no D/no C/no acid reflux Musculoskeletal: no muscle aches/no joint aches Skin: no rashes, no hair loss Neurological: no tremors/no numbness/no tingling/no dizziness  I reviewed pt's medications, allergies, PMH, social hx, family hx, and changes were documented in the history of present illness. Otherwise, unchanged from my initial visit note.  Past Medical History:  Diagnosis Date  . Benign essential tremor   . Cataract   . Diabetes mellitus ([place])   . Full dentures   . Hyperlipidemia   . Uncontrolled diabetes mellitus type 2 without complications ([place]) 5/00/9381  . Wears glasses    Past Surgical History:  Procedure Laterality Date  . APPENDECTOMY     childhood  . COLONOSCOPY     in [place] in 2000  . INCISION AND DRAINAGE ABSCESS N/A 05/18/2013   Procedure: EXCISION OF SEBACEOUS CYST BACK;  Surgeon: Merrie Roof, MD;  Location: [facility];  Service: General;  Laterality: N/A;  . surgery to remove cyst on back  2010   History   Social History  . Marital Status: Widowed    Spouse Name: N/A  . Number of Children: 7   Occupational History  . retired    Social History Main Topics  . Smoking status: Never Smoker   . Smokeless tobacco:  Never Used  . Alcohol Use: No  . Drug Use: No   Current Outpatient Medications on File Prior to Visit  Medication Sig Dispense Refill  . aspirin 81 MG tablet Take 81 mg by mouth daily.    Marland Kitchen atorvastatin (LIPITOR) 20 MG tablet Take 20 mg by mouth daily.    . calcium carbonate 200 MG capsule Take 1 capsule by mouth as directed    . clarithromycin (BIAXIN) 500 MG tablet Take one BID x 10 days (Patient not taking: Reported on 09/28/2015) 30 tablet 0  . HUMALOG KWIKPEN 100  UNIT/ML KiwkPen INJECT 6-8 UNITS INTO THE SKIN THREE TIMES DAILY (NEEDS APPOINTMENT FOR FURTHER REFILLS) 15 mL 5  . Insulin Glargine (LANTUS SOLOSTAR) 100 UNIT/ML Solostar Pen Inject 20 units into the skin 2 times daily 15 pen 1  . Insulin Pen Needle (CAREFINE PEN NEEDLES) 32G X 4 MM MISC Use 4x a day 200 each 2  . latanoprost (XALATAN) 0.005 % ophthalmic solution     . metroNIDAZOLE (FLAGYL) 250 MG tablet Take one po QID x 10 days (Patient not taking: Reported on 09/28/2015) 40 tablet 0  . Omega-3 Fatty Acids (FISH OIL) 1000 MG CAPS Take 1 capsule by mouth daily.    Marland Kitchen omeprazole (PRILOSEC) 40 MG capsule Take one po BID x 10 days then daily (Patient not taking: Reported on 09/28/2015) 40 capsule 2  . saxagliptin HCl (ONGLYZA) 5 MG TABS tablet Take 5 mg by mouth daily.    . valsartan (DIOVAN) 80 MG tablet Take 80 mg by mouth daily.     No current facility-administered medications on file prior to visit.    Allergies  Allergen Reactions  . Metformin And Related Nausea Only   Family History  Problem Relation Age of Onset  . Colon cancer Neg Hx   . Esophageal cancer Neg Hx   . Rectal cancer Neg Hx   . Stomach cancer Neg Hx    PE: BP 136/84   Pulse 77   Ht '5\' 4"'$  (1.626 m)   Wt 166 lb 12.8 oz (75.7 kg)   SpO2 96%   BMI 28.63 kg/m  Wt Readings from Last 3 Encounters:  04/28/17 166 lb 12.8 oz (75.7 kg)  09/28/15 175 lb (79.4 kg)  04/25/14 180 lb 12.8 oz (82 kg)   Constitutional: overweight, in NAD Eyes: PERRLA, EOMI, no exophthalmos ENT: moist mucous membranes, no thyromegaly, no cervical lymphadenopathy Cardiovascular: RRR, No MRG Respiratory: CTA B Gastrointestinal: abdomen soft, NT, ND, BS+ Musculoskeletal: no deformities, strength intact in all 4 Skin: moist, warm, no rashes Neurological: + tremor with outstretched hands, DTR normal in all 4  ASSESSMENT: 1. DM2, insulin-dependent, uncontrolled, without long-term complications  2. HL  PLAN:  1. Patient with long-standing,  uncontrolled, diabetes, on basal-bolus insulin regimen, returning after long absence.  She missed several appointments.  She again does not bring her sugar log or meter and she cannot remember the values of her CBGs. However, daughetr mentions they are 90-400s - she could not tolerate the Novolog b/c leg cramps so they increased the long acting insulin, but this is conducive to large fluctuations in CBGs >> will try to reduce Lantus and start Regular insulin  - I advised her to: Patient Instructions  Please decrease: - Lantus 20 units at bedtime  Please add: - Regular insulin 30 min before each main meal: 6 units before a smaller meal 8 units before a regular meal  (10 units for large meal: eating out, adding  a dessert)  Please return in 1.5 month with your sugar log.     - continue checking sugars at different times of the day - check 1x a day, rotating checks - advised for yearly eye exams >> she is UTD - Return to clinic in 3 mo with sugar log   2. HL -Reviewed latest lipid panel from 2019: LDL still high -off Lipitor now >> will need to restart especially if no leg cramps from R insulin  Philemon Kingdom, MD PhD [facility]

## 2017-05-18 ENCOUNTER — Other Ambulatory Visit: Payer: Self-pay | Admitting: Internal Medicine

## 2017-05-21 ENCOUNTER — Ambulatory Visit: Payer: Medicare Other | Admitting: Cardiology

## 2017-06-09 ENCOUNTER — Encounter: Payer: Self-pay | Admitting: Cardiology

## 2017-06-09 ENCOUNTER — Ambulatory Visit (INDEPENDENT_AMBULATORY_CARE_PROVIDER_SITE_OTHER): Payer: Medicare Other | Admitting: Cardiology

## 2017-06-09 VITALS — BP 114/66 | HR 90 | Ht 64.0 in | Wt 173.0 lb

## 2017-06-09 DIAGNOSIS — I951 Orthostatic hypotension: Secondary | ICD-10-CM | POA: Diagnosis not present

## 2017-06-09 DIAGNOSIS — E1165 Type 2 diabetes mellitus with hyperglycemia: Secondary | ICD-10-CM | POA: Diagnosis not present

## 2017-06-09 DIAGNOSIS — I351 Nonrheumatic aortic (valve) insufficiency: Secondary | ICD-10-CM

## 2017-06-09 DIAGNOSIS — E1121 Type 2 diabetes mellitus with diabetic nephropathy: Secondary | ICD-10-CM | POA: Diagnosis not present

## 2017-06-09 DIAGNOSIS — IMO0002 Reserved for concepts with insufficient information to code with codable children: Secondary | ICD-10-CM

## 2017-06-09 NOTE — Progress Notes (Signed)
PCP: Harlan Stains, MD  Endocrinologist: Dr. Benjiman Core  Clinic Note: Chief Complaint  Patient presents with  . New Patient (Initial Visit)    Near syncope    HPI: Stacey Fuentes is a 82 y.o. female who is being seen today for the evaluation of ~ fainting spells & BP - up & down => seen at the request of Lois Hux[place], [place] & Dr. Dema Severin. DM2, dx in 1990, insulin-dependent since 1991, uncontrolled, with complications (nephropathy -A1c 12.6) recently referred to Dr. Cruzita Lederer for management of diabetes.  Nareh Matzke was seen on March 19 by Dr. Cruzita Lederer  Recent Hospitalizations: None  Studies Personally Reviewed - (if available, images/films reviewed: From Epic Chart or Care Everywhere)  None  Much of the history is obtained from discussion with her her daughter.  Interval History: Standley Brooking presents here today episodes of near syncope and dizziness.  Thankfully, these have not happened over the last 3-4 months.  In the last couple months what has happened is that she is no longer on any blood pressure medications, because before then she was having swings of blood pressures going up and down and difficult to manage.   she does have poor balance as well and walks with a cane.  But when she is noted that there is that she is also taking the habit of standing still for about a minute or 2 after standing up before she starts walking.  When doing this now she notes that she has not had any falls or or or stumbling gait.   It would appear that prior to moving in with her daughter who now is making sure that she has a stable diet, she has been losing weight.  She is now somewhat stabilized.   she had always been drinking well but was  not necessarily eating routinely.   She had been noticing some shortness of breath with exertion, but since she started using an incentive spirometer throughout the day, she is noted this is improved and she is actually starting to build to walk longer  distances.  As such she is not noticing any more notable exertional dyspnea or chest discomfort with the extent of walking that she is able to do. She denies any PND, orthopnea or edema. No palpitations -or rapid irregular heartbeats just occasional "fluttering but nothing; lasting more than a few seconds..  In the last 2 to 3 months she has not had any near syncopal episodes just a few dizzy symptoms. No TIA/amaurosis fugax symptoms. No melena, hematochezia, hematuria, or epstaxis. No claudication.   ROS: A comprehensive was performed. Review of Systems  Constitutional: Positive for malaise/fatigue (Overall improving). Negative for weight loss (Weight has now stabilized.).  HENT: Negative for congestion and nosebleeds.   Eyes: Negative for blurred vision.  Respiratory: Negative for cough.        Per HPI  Gastrointestinal: Negative for abdominal pain and heartburn.  Genitourinary: Negative for dysuria.  Musculoskeletal: Negative for falls (None in the last 3-4 months.), joint pain and myalgias.  Neurological:       Still has poor balance issues.  Walks with cane.  Psychiatric/Behavioral: Negative for memory loss. The patient is not nervous/anxious and does not have insomnia.   All other systems reviewed and are negative.  I have reviewed and (if needed) personally updated the patient's problem list, medications, allergies, past medical and surgical history, social and family history.   Past Medical History:  Diagnosis Date  . Benign essential tremor   .  Cataract   . Diabetes mellitus ([place])   . Full dentures   . Hyperlipidemia   . Uncontrolled diabetes mellitus type 2 without complications ([place]) 4/31/5400  . Wears glasses     Past Surgical History:  Procedure Laterality Date  . APPENDECTOMY     childhood  . COLONOSCOPY     in [place] in 2000  . INCISION AND DRAINAGE ABSCESS N/A 05/18/2013   Procedure: EXCISION OF SEBACEOUS CYST BACK;  Surgeon: Merrie Roof, MD;  Location:  [facility];  Service: General;  Laterality: N/A;  . surgery to remove cyst on back  2010    Current Meds  Medication Sig  . ACCU-CHEK SOFTCLIX LANCETS lancets Use to check blood sugars twice daily  . aspirin 81 MG tablet Take 81 mg by mouth daily.  . Blood Glucose Monitoring Suppl (ACCU-CHEK AVIVA) device Use as instructed  . calcium carbonate 200 MG capsule Take 1 capsule by mouth as directed  . glucose blood (ACCU-CHEK AVIVA) test strip Use to check blood sugars twice daily  . Insulin Glargine (LANTUS SOLOSTAR) 100 UNIT/ML Solostar Pen Inject 20 units into the skin at bedtime  . Insulin Glargine (LANTUS SOLOSTAR) 100 UNIT/ML Solostar Pen INJECT 20 UNITS  SUBCUTANEOUSLY TWO TIMES  DAILY  . Insulin Pen Needle (CAREFINE PEN NEEDLES) 32G X 4 MM MISC Use 4x a day  . Insulin Syringe-Needle U-100 (BD INSULIN SYRINGE ULTRAFINE) 31G X 15/64" 0.5 ML MISC Use 3x a day  . Omega-3 Fatty Acids (FISH OIL) 1000 MG CAPS Take 1 capsule by mouth daily.  Marland Kitchen omeprazole (PRILOSEC) 40 MG capsule Take one po BID x 10 days then daily    Allergies  Allergen Reactions  . Metformin And Related Nausea Only    Social History   Tobacco Use  . Smoking status: Never Smoker  . Smokeless tobacco: Never Used  Substance Use Topics  . Alcohol use: No  . Drug use: No   Social History   Social History Narrative  . Not on file    family history is not on file.  Wt Readings from Last 3 Encounters:  06/09/17 173 lb (78.5 kg)  04/28/17 166 lb 12.8 oz (75.7 kg)  09/28/15 175 lb (79.4 kg)    PHYSICAL EXAM BP 114/66 (BP Location: Left Arm, Patient Position: Sitting, Cuff Size: Normal)   Pulse 90   Ht 5\' 4"  (1.626 m)   Wt 173 lb (78.5 kg)   BMI 29.70 kg/m  Physical Exam  Constitutional: She is oriented to person, place, and time. She appears well-developed and well-nourished. No distress.  HENT:  Head: Normocephalic and atraumatic.  Eyes: Conjunctivae and EOM are normal. No scleral icterus.   Neck: No hepatojugular reflux and no JVD present. Carotid bruit is not present.  Cardiovascular: Normal rate, regular rhythm, S1 normal and S2 normal.  Occasional extrasystoles are present. PMI is not displaced. Exam reveals distant heart sounds and decreased pulses (Mildly diminished DP pulses). Exam reveals no gallop and no friction rub.  Murmur (Soft 1/6 SEM) heard. Pulmonary/Chest: Effort normal and breath sounds normal. No respiratory distress. She has no wheezes. She exhibits no tenderness.  Abdominal: Soft. Bowel sounds are normal. She exhibits no distension. There is no tenderness. There is no rebound.  Musculoskeletal: Normal range of motion. She exhibits no edema (Trace).  Neurological: She is alert and oriented to person, place, and time. No cranial nerve deficit.  Skin: Skin is warm and dry. No rash noted. No erythema.  Psychiatric: She has a normal mood and affect. Her behavior is normal. Judgment and thought content normal.  Seems quiet and reserved.  Nursing note and vitals reviewed.     Adult ECG Report  Rate: 90 ;  Rhythm: normal sinus rhythm and Otherwise normal axis and intervals and durations;   Narrative Interpretation: Normal echo   Other studies Reviewed: Additional studies/ records that were reviewed today include:  Recent Labs: February 2019: TC 228, TG 74, LDL 143, HDL 71.  BUN/creatinine 20/0.93.  TSH 1.04.  A1c 12.6.  ASSESSMENT / PLAN: Problem List Items Addressed This Visit    Uncontrolled type 2 diabetes mellitus with nephropathy ([place])    With diabetes, she could have some neuropathy and autonomic dysfunction.  Need to monitor for neurogenic are not but dysfunction.  However I think most of what she is having is not necessarily related to this.      Orthostatic syncope - Primary    She has had lots of spells where she was getting dizzy and lightheaded with standing up.  Now that she is no longer blood pressure medications she is doing better with this.   Also the additional changes that she is now living with her daughter and therefore eating and drinking better.  I think with continued adequate hydration and eating well, she is likely to be less orthostatic as she already is. Also with some mild edema recommend support stockings as opposed to a diuretic which would make things worse.  Check blood pressure log with different positions.  We will need to check orthostatics in his follow-up.      Relevant Orders   EKG 12-Lead (Completed)   ECHOCARDIOGRAM COMPLETE   Aortic ejection murmur    With some near syncope episodes, and a murmur on exam, need to exclude significant aortic stenosis as the etiology, but unlikely. Plan: 2D echo.      Relevant Orders   EKG 12-Lead (Completed)   ECHOCARDIOGRAM COMPLETE      I spent a total of 40 minutes with the patient and chart review. >  50% of the time was spent in direct patient consultation.   Current medicines are reviewed at length with the patient today.  (+/- concerns) N/A The following changes have been made:  Agree with no blood pressure medications.  Patient Instructions  MEDICATION INSTRUCTIONS  DO NOT RESTART  BLOOD PRESSURE  MEDICAITONS   RECOMMEND EATING WELL KEEP HYDRATE  - ABOUT 8 TO 10 GLASSES A DAY  PURCHASING SUPPORT KNEE HI  Or SOCKS 8- 10 mmhg   Check  Blood pressure Laying down and Sitting   Periodically  2 to three times a week     TEST SCHEDULE AT [facility] Your physician has requested that you have a carotid duplex. This test is an ultrasound of the carotid arteries in your neck. It looks at blood flow through these arteries that supply the brain with blood. Allow one hour for this exam. There are no restrictions or special instructions.  AND  SCHEDULE AT [facility] has requested that you have an echocardiogram. Echocardiography is a painless test that uses sound waves to create images of your  heart. It provides your doctor with information about the size and shape of your heart and how well your heart's Fuentes and valves are working. This procedure takes approximately one hour. There are no restrictions for this procedure.   Your physician recommends that you  schedule a follow-up appointment in 1 month with DR Lakeesha Fontanilla        Studies Ordered:   Orders Placed This Encounter  Procedures  . EKG 12-Lead  . ECHOCARDIOGRAM COMPLETE      Glenetta Hew, M.D., M.S. Interventional Cardiologist   Pager # [phone] Phone # [phone] [address] [place], [place] [address]   Thank you for choosing [facility]!!

## 2017-06-09 NOTE — Patient Instructions (Signed)
MEDICATION INSTRUCTIONS  DO NOT RESTART  BLOOD PRESSURE  MEDICAITONS   RECOMMEND EATING WELL KEEP HYDRATE  - ABOUT 8 TO 10 GLASSES A DAY  PURCHASING SUPPORT KNEE HI  Or SOCKS 8- 10 mmhg   Check  Blood pressure Laying down and Sitting   Periodically  2 to three times a week     TEST SCHEDULE AT [place] [facility] Your physician has requested that you have a carotid duplex. This test is an ultrasound of the carotid arteries in your neck. It looks at blood flow through these arteries that supply the brain with blood. Allow one hour for this exam. There are no restrictions or special instructions.  AND  SCHEDULE AT [facility] has requested that you have an echocardiogram. Echocardiography is a painless test that uses sound waves to create images of your heart. It provides your doctor with information about the size and shape of your heart and how well your heart's chambers and valves are working. This procedure takes approximately one hour. There are no restrictions for this procedure.   Your physician recommends that you schedule a follow-up appointment in 1 month with DR [place]

## 2017-06-10 ENCOUNTER — Other Ambulatory Visit: Payer: Self-pay | Admitting: Cardiology

## 2017-06-10 DIAGNOSIS — I951 Orthostatic hypotension: Secondary | ICD-10-CM

## 2017-06-10 DIAGNOSIS — I6523 Occlusion and stenosis of bilateral carotid arteries: Secondary | ICD-10-CM

## 2017-06-11 ENCOUNTER — Encounter: Payer: Self-pay | Admitting: Cardiology

## 2017-06-11 DIAGNOSIS — I951 Orthostatic hypotension: Secondary | ICD-10-CM | POA: Insufficient documentation

## 2017-06-11 DIAGNOSIS — I351 Nonrheumatic aortic (valve) insufficiency: Secondary | ICD-10-CM | POA: Insufficient documentation

## 2017-06-11 NOTE — Assessment & Plan Note (Addendum)
She has had lots of spells where she was getting dizzy and lightheaded with standing up.  Now that she is no longer blood pressure medications she is doing better with this.  Also the additional changes that she is now living with her daughter and therefore eating and drinking better.  I think with continued adequate hydration and eating well, she is likely to be less orthostatic as she already is. Also with some mild edema recommend support stockings as opposed to a diuretic which would make things worse.  Check blood pressure log with different positions.  We will need to check orthostatics in his follow-up.

## 2017-06-11 NOTE — Assessment & Plan Note (Signed)
With diabetes, she could have some neuropathy and autonomic dysfunction.  Need to monitor for neurogenic are not but dysfunction.  However I think most of what she is having is not necessarily related to this.

## 2017-06-11 NOTE — Assessment & Plan Note (Signed)
With some near syncope episodes, and a murmur on exam, need to exclude significant aortic stenosis as the etiology, but unlikely. Plan: 2D echo.

## 2017-06-17 ENCOUNTER — Ambulatory Visit (HOSPITAL_COMMUNITY)
Admission: RE | Admit: 2017-06-17 | Discharge: 2017-06-17 | Disposition: A | Discharge status: Home / Self Care | Payer: Medicare Other | Source: Ambulatory Visit | Attending: Cardiology | Admitting: Cardiology

## 2017-06-17 ENCOUNTER — Other Ambulatory Visit: Payer: Self-pay

## 2017-06-17 ENCOUNTER — Ambulatory Visit (HOSPITAL_BASED_OUTPATIENT_CLINIC_OR_DEPARTMENT_OTHER): Payer: Medicare Other

## 2017-06-17 DIAGNOSIS — I951 Orthostatic hypotension: Secondary | ICD-10-CM | POA: Insufficient documentation

## 2017-06-17 DIAGNOSIS — E119 Type 2 diabetes mellitus without complications: Secondary | ICD-10-CM | POA: Diagnosis not present

## 2017-06-17 DIAGNOSIS — I517 Cardiomegaly: Secondary | ICD-10-CM | POA: Diagnosis not present

## 2017-06-17 DIAGNOSIS — R55 Syncope and collapse: Secondary | ICD-10-CM | POA: Diagnosis present

## 2017-06-17 DIAGNOSIS — I6523 Occlusion and stenosis of bilateral carotid arteries: Secondary | ICD-10-CM | POA: Diagnosis not present

## 2017-06-17 DIAGNOSIS — I351 Nonrheumatic aortic (valve) insufficiency: Secondary | ICD-10-CM | POA: Diagnosis not present

## 2017-06-17 DIAGNOSIS — R011 Cardiac murmur, unspecified: Secondary | ICD-10-CM | POA: Insufficient documentation

## 2017-06-17 DIAGNOSIS — Z794 Long term (current) use of insulin: Secondary | ICD-10-CM | POA: Diagnosis not present

## 2017-06-17 HISTORY — PX: TRANSTHORACIC ECHOCARDIOGRAM: SHX275

## 2017-06-25 ENCOUNTER — Telehealth: Payer: Self-pay | Admitting: *Deleted

## 2017-06-25 NOTE — Telephone Encounter (Signed)
LEFT MESSAGE TO CALL - IN REGARDS TO ECHO RESULTS

## 2017-06-25 NOTE — Telephone Encounter (Signed)
-----   Message from Leonie Man, MD sent at 06/24/2017 11:50 PM EDT ----- Carotid Doppler results are relatively normal.  Both carotid show only mild narrowings.  Nothing to be concerned about.  Vertebral and clavian arteries also look normal.  Glenetta Hew, MD

## 2017-06-25 NOTE — Telephone Encounter (Signed)
-----   Message from Leonie Man, MD sent at 06/24/2017 11:43 PM EDT ----- Moderately thickened left ventricle with normal pump function and mildly abnormal relaxation.  No evidence of any significant valve lesions.  I suspect the murmur could be an outflow tract murmur from slightly thickened ventricle.  Likely benign with no significant valve lesions.  Glenetta Hew, MD

## 2017-06-25 NOTE — Telephone Encounter (Signed)
LEFT MESSAGE  TO CALL BACK -- ECHO , CAROTID RESULTS

## 2017-06-25 NOTE — Telephone Encounter (Signed)
Pt's daughter Lawernce Pitts is calling  Returning your call concerning pt.

## 2017-06-25 NOTE — Telephone Encounter (Signed)
Returned call to daughter and provided carotid and echo results but was concerned about the mild narrowing of the carotid. Informed that per Dr. Ellyn Hack notes it's nothing to be concerned at the moment. Daughter verbalized understanding with no further questions.

## 2017-06-26 ENCOUNTER — Encounter: Payer: Self-pay | Admitting: Internal Medicine

## 2017-06-26 ENCOUNTER — Ambulatory Visit (INDEPENDENT_AMBULATORY_CARE_PROVIDER_SITE_OTHER): Payer: Medicare Other | Admitting: Internal Medicine

## 2017-06-26 VITALS — BP 130/82 | HR 89 | Temp 98.0°F | Ht 64.0 in | Wt 172.8 lb

## 2017-06-26 DIAGNOSIS — IMO0002 Reserved for concepts with insufficient information to code with codable children: Secondary | ICD-10-CM

## 2017-06-26 DIAGNOSIS — E1121 Type 2 diabetes mellitus with diabetic nephropathy: Secondary | ICD-10-CM | POA: Diagnosis not present

## 2017-06-26 DIAGNOSIS — E1165 Type 2 diabetes mellitus with hyperglycemia: Secondary | ICD-10-CM

## 2017-06-26 DIAGNOSIS — E785 Hyperlipidemia, unspecified: Secondary | ICD-10-CM | POA: Diagnosis not present

## 2017-06-26 LAB — POCT GLYCOSYLATED HEMOGLOBIN (HGB A1C): HEMOGLOBIN A1C: 9.6

## 2017-06-26 MED ORDER — INSULIN GLARGINE 100 UNIT/ML SOLOSTAR PEN: PEN_INJECTOR | SUBCUTANEOUS | 3 refills | Status: DC

## 2017-06-26 NOTE — Progress Notes (Signed)
Patient ID: Stacey Fuentes, female   DOB: February 17, 1935, 82 y.o.   MRN: [id number]  HPI: Stacey Fuentes is a 82 y.o.-year-old female, initially referred by her PCP, Dr. Dema Severin, for management of DM2, dx in 1990, insulin-dependent since 1991, uncontrolled, with complications (nephropathy).  Last visit 2 months ago.  She is here with her daughter who offers most of the history, especially regarding her insulin doses and CBG values.  Since last visit, sugars improved and patient is feeling better, but without the help of regular insulin which they stopped due to skin discoloration.  She is walking frequently with her daughter.  Last hemoglobin A1c was: 03/27/2017: HbA1c 12.6% No results found for: HGBA1C  03/14/2014: 10.8%  Pt is on a regimen of: - Lantus 20 >> 30 units at bedtime - Regular insulin 30 min before each main meal >> stopped as toes turned dark.  Since then, she sold her feet and they recovered their collar.  They would not want to restart r any mealtime insulin.  We stopped Onglyza before.  Pt checks her sugars 1-2x a day: - am: low 200s >> 175 x1 yesterday >> 99-400s >> 109-130 - 2h after b'fast: n/c >> 500s >> n/c - before lunch: - immediately after lunch: 200-250 - before dinner: - 2h after dinner: 232 >> 75 x1 >> n/c - bedtime: - nighttime: Lowest sugar was 90s >> 80 x1; she has hypoglycemia awareness at 80s. Highest sugar was 400s >> 200s.  Glucometer: AccuCheck Compact plus - lancets softclix plus  -No CKD, last BUN/creatinine:  03/27/2017: Glu 155, 20/0.93, eGFR 70 08/27/2015:  29/1.16, eGFR 55 03/13/2014: 18/1.07, eGFR 60 Lab Results  Component Value Date   BUN 20 03/29/2014   CREATININE 0.92 03/29/2014  She is not taking the valsartan 80.  -+ HL; last set of lipids: 03/27/2017: 228/74/71/143 12/27/2014: 245/158/60/154 03/13/2014: 196/68/63/122.  Not taking the Lipitor. - last eye exam was in 01/2017- ? DR.  She has a history of cataract surgery - no  numbness and tingling in her feet.  03/27/2017:TSH 1.04  ROS: Constitutional: no weight gain/no weight loss, no fatigue, no subjective hyperthermia, no subjective hypothermia Eyes: no blurry vision, no xerophthalmia ENT: no sore throat, no nodules palpated in throat, no dysphagia, no odynophagia, no hoarseness Cardiovascular: no CP/no SOB/no palpitations/no leg swelling Respiratory: no cough/no SOB/no wheezing Gastrointestinal: no N/no V/no D/no C/no acid reflux Musculoskeletal: no muscle aches/no joint aches Skin: no rashes, no hair loss Neurological: no tremors/no numbness/no tingling/no dizziness  I reviewed pt's medications, allergies, PMH, social hx, family hx, and changes were documented in the history of present illness. Otherwise, unchanged from my initial visit note.  Past Medical History:  Diagnosis Date  . Benign essential tremor   . Cataract   . Diabetes mellitus ([place])   . Full dentures   . Hyperlipidemia   . Uncontrolled diabetes mellitus type 2 without complications ([place]) 07/08/4130  . Wears glasses    Past Surgical History:  Procedure Laterality Date  . APPENDECTOMY     childhood  . COLONOSCOPY     in [place] in 2000  . INCISION AND DRAINAGE ABSCESS N/A 05/18/2013   Procedure: EXCISION OF SEBACEOUS CYST BACK;  Surgeon: Merrie Roof, MD;  Location: [facility];  Service: General;  Laterality: N/A;  . surgery to remove cyst on back  2010   History   Social History  . Marital Status: Widowed    Spouse Name: N/A  . Number of Children:  7   Occupational History  . retired    Social History Main Topics  . Smoking status: Never Smoker   . Smokeless tobacco: Never Used  . Alcohol Use: No  . Drug Use: No   Current Outpatient Medications on File Prior to Visit  Medication Sig Dispense Refill  . ACCU-CHEK SOFTCLIX LANCETS lancets Use to check blood sugars twice daily 200 each 5  . aspirin 81 MG tablet Take 81 mg by mouth daily.    . Blood  Glucose Monitoring Suppl (ACCU-CHEK AVIVA) device Use as instructed 1 each 0  . calcium carbonate 200 MG capsule Take 1 capsule by mouth as directed    . glucose blood (ACCU-CHEK AVIVA) test strip Use to check blood sugars twice daily 100 each 12  . Insulin Glargine (LANTUS SOLOSTAR) 100 UNIT/ML Solostar Pen Inject 20 units into the skin at bedtime 15 pen 3  . Insulin Pen Needle (CAREFINE PEN NEEDLES) 32G X 4 MM MISC Use 4x a day 200 each 2  . Insulin Syringe-Needle U-100 (BD INSULIN SYRINGE ULTRAFINE) 31G X 15/64" 0.5 ML MISC Use 3x a day 200 each 5  . Omega-3 Fatty Acids (FISH OIL) 1000 MG CAPS Take 1 capsule by mouth daily.    Marland Kitchen omeprazole (PRILOSEC) 40 MG capsule Take one po BID x 10 days then daily 40 capsule 2   No current facility-administered medications on file prior to visit.    Allergies  Allergen Reactions  . Metformin And Related Nausea Only   Family History  Problem Relation Age of Onset  . Colon cancer Neg Hx   . Esophageal cancer Neg Hx   . Rectal cancer Neg Hx   . Stomach cancer Neg Hx    PE: BP 130/82 (BP Location: Right Arm, Patient Position: Sitting, Cuff Size: Normal)   Pulse 89   Temp 98 F (36.7 C) (Oral)   Ht '5\' 4"'$  (1.626 m)   Wt 172 lb 12.8 oz (78.4 kg)   SpO2 97%   BMI 29.66 kg/m  Wt Readings from Last 3 Encounters:  06/26/17 172 lb 12.8 oz (78.4 kg)  06/09/17 173 lb (78.5 kg)  04/28/17 166 lb 12.8 oz (75.7 kg)   Constitutional: overweight, in NAD Eyes: PERRLA, EOMI, no exophthalmos ENT: moist mucous membranes, no thyromegaly, no cervical lymphadenopathy Cardiovascular: RRR, No MRG Respiratory: CTA B Gastrointestinal: abdomen soft, NT, ND, BS+ Musculoskeletal: no deformities, strength intact in all 4 Skin: moist, warm, no rashes Neurological: + tremor with outstretched hands, DTR normal in all 4  ASSESSMENT: 1. DM2, insulin-dependent, uncontrolled, without long-term complications  2. HL  PLAN:  1. Patient with long-standing, uncontrolled,  type 2 diabetes, on basal-bolus insulin regimen, with still very high blood sugars.  She usually comes without her sugar log or meter and she cannot remember the balance of her CBGs, but usually they are between 90 and 400s.   - She could not tolerate NovoLog in the past because of leg cramps so at last visit we tried to start regular insulin and to reduce her Lantus dose.  However, they mentioned that her toes turn dark after she started regular insulin and they had to soak them repeatedly insulted water to regain their color.  They would not want to restart any type of mealtime insulin for now.  However, they mentioned that the sugars are better and the patient is feeling better on Lantus 30 units at bedtime - Per day recall, the sugars are at goal in the morning,  but they increase after meals - We discussed about the possibility of adding glipizide, but patient and her daughter refused for now, wanting to continue with the same regimen since she saw improvement on this. - I advised her to: Patient Instructions  Please continue: - Lantus 30 units at bedtime  Please return in 3 month with your sugar log.     - today, HbA1c is 9.6% (improved) - continue checking sugars at different times of the day - check 3x a day, rotating checks - advised for yearly eye exams >> she is UTD - Return to clinic in 3 mo with sugar log   2. HL - Reviewed latest lipid panel from 2019: LDL still high - off Lipitor now - off b/c leg cramps  - May need another lipid panel at next visit and possibly restart Lipitor then if they agree  Philemon Kingdom, MD PhD [facility]

## 2017-06-26 NOTE — Patient Instructions (Addendum)
Please continue: - Lantus 30 units at bedtime  Please return in 3-4 month with your sugar log.

## 2017-07-09 ENCOUNTER — Encounter: Payer: Self-pay | Admitting: Cardiology

## 2017-07-09 ENCOUNTER — Ambulatory Visit (INDEPENDENT_AMBULATORY_CARE_PROVIDER_SITE_OTHER): Payer: Medicare Other | Admitting: Cardiology

## 2017-07-09 VITALS — BP 145/79 | HR 94 | Ht 64.0 in | Wt 173.0 lb

## 2017-07-09 DIAGNOSIS — I951 Orthostatic hypotension: Secondary | ICD-10-CM

## 2017-07-09 DIAGNOSIS — I10 Essential (primary) hypertension: Secondary | ICD-10-CM | POA: Diagnosis not present

## 2017-07-09 NOTE — Patient Instructions (Signed)
NO CHANGES WITH MEDICATIONS     Your physician recommends that you schedule a follow-up appointment ON AS NEEDED BASIS

## 2017-07-09 NOTE — Progress Notes (Signed)
PCP: Harlan Stains, MD  Endocrinologist: Dr. Benjiman Core  Clinic Note: Chief Complaint  Patient presents with  . Follow-up    1 month; no recurrent episodes  . Loss of Consciousness    None since last visit    HPI: Stacey Fuentes is a 82 y.o. female who is being seen today for follow-upevaluation of ~ fainting spells & BP - up & down => seen at the request of Harlan Stains, MD DM2, dx in 1990, insulin-dependent since 1991, uncontrolled, with complications (nephropathy -A1c 12.6) recently referred to Dr. Cruzita Lederer for management of diabetes.  Stacey Fuentes was seen on March 19 by Dr. Cruzita Lederer  Recent Hospitalizations: None  Studies Personally Reviewed - (if available, images/films reviewed: From Epic Chart or Care Everywhere)  2D echo May 8.  EF 60-65%.  Normal wall motion.  GR 1 DD.  No obvious valvular lesions.  Carotid Dopplers, essentially normal  Much of the history is obtained from discussion with her her daughter.  Interval History: Stacey Fuentes presents here today no longer having any episodes of lightheadedness or dizziness.  Really since we stopped her blood pressure medications, she has been doing fine.  In fact her blood pressures have been pretty much what they are now.  She has more energy with less lethargy.  Better balance with higher blood pressure.  She is not having any exertional dyspnea beyond her baseline, and has not had any chest tightness or pressure with rest or exertion. No headache or blurred vision, dizziness, wooziness or syncope/near syncope, TIA/amaurosis fugax.  of near syncope and dizziness.  Thankfully, these have not happened over the last 3-4 months.  In the last couple months what has happened is that she is no longer on any blood pressure medications, because before then she was having swings of blood pressures going up and down and difficult to manage.   she does have poor balance as well and walks with a cane.  But when she is noted that  there is that she is also taking the habit of standing still for about a minute or 2 after standing up before she starts walking.  When doing this now she notes that she has not had any falls or or or stumbling gait.  No claudication.   ROS: A comprehensive was performed. Review of Systems  Constitutional: Positive for malaise/fatigue (Essentially resolved). Weight loss: Weight has now stabilized.  Respiratory:       Per HPI  Musculoskeletal: Negative for falls (None in the last 3-4 months.).  Neurological: Negative for dizziness.       Still has poor balance issues.  Walks with cane.   I have reviewed and (if needed) personally updated the patient's problem list, medications, allergies, past medical and surgical history, social and family history.   Past Medical History:  Diagnosis Date  . Benign essential tremor   . Cataract   . Diabetes mellitus ([place])   . Full dentures   . Hyperlipidemia   . Uncontrolled diabetes mellitus type 2 without complications ([place]) 4/62/7035  . Wears glasses     Past Surgical History:  Procedure Laterality Date  . APPENDECTOMY     childhood  . COLONOSCOPY     in [place] in 2000  . INCISION AND DRAINAGE ABSCESS N/A 05/18/2013   Procedure: EXCISION OF SEBACEOUS CYST BACK;  Surgeon: Merrie Roof, MD;  Location: [facility];  Service: General;  Laterality: N/A;  . surgery to remove cyst on back  2010  . TRANSTHORACIC ECHOCARDIOGRAM  06/17/2017    EF 60-65%.  Normal wall motion.  GR 1 DD.  No obvious valvular lesions.    Current Meds  Medication Sig  . ACCU-CHEK SOFTCLIX LANCETS lancets Use to check blood sugars twice daily  . aspirin 81 MG tablet Take 81 mg by mouth daily.  . Blood Glucose Monitoring Suppl (ACCU-CHEK AVIVA) device Use as instructed  . calcium carbonate 200 MG capsule Take 1 capsule by mouth as directed  . glucose blood (ACCU-CHEK AVIVA) test strip Use to check blood sugars twice daily  . Insulin Glargine (LANTUS  SOLOSTAR) 100 UNIT/ML Solostar Pen Inject 30 units into the skin at bedtime  . Insulin Pen Needle (CAREFINE PEN NEEDLES) 32G X 4 MM MISC Use 4x a day  . Omega-3 Fatty Acids (FISH OIL) 1000 MG CAPS Take 1 capsule by mouth daily.  . [DISCONTINUED] omeprazole (PRILOSEC) 40 MG capsule Take one po BID x 10 days then daily    Allergies  Allergen Reactions  . Metformin And Related Nausea Only    Social History   Tobacco Use  . Smoking status: Never Smoker  . Smokeless tobacco: Never Used  Substance Use Topics  . Alcohol use: No  . Drug use: No   Social History   Social History Narrative  . Not on file    family history is not on file.  Wt Readings from Last 3 Encounters:  07/09/17 173 lb (78.5 kg)  06/26/17 172 lb 12.8 oz (78.4 kg)  06/09/17 173 lb (78.5 kg)    PHYSICAL EXAM BP (!) 145/79   Pulse 94   Ht 5\' 4"  (1.626 m)   Wt 173 lb (78.5 kg)   BMI 29.70 kg/m  Physical Exam  Constitutional: She is oriented to person, place, and time. She appears well-developed and well-nourished. No distress.  Neck: No hepatojugular reflux present. Carotid bruit is not present.  Cardiovascular: Normal rate, regular rhythm, S1 normal and S2 normal.  No extrasystoles are present. PMI is not displaced. Exam reveals distant heart sounds and decreased pulses (Mildly diminished DP pulses). Exam reveals no gallop and no friction rub.  Murmur (Soft 1/6 SEM) heard. Pulmonary/Chest: Effort normal and breath sounds normal. No respiratory distress. She has no wheezes. She exhibits no tenderness.  Musculoskeletal: Normal range of motion. She exhibits no edema (Trace).  Neurological: She is alert and oriented to person, place, and time.  Psychiatric: She has a normal mood and affect. Her behavior is normal. Judgment and thought content normal.  Seems quiet and reserved.  Nursing note and vitals reviewed.     Adult ECG Report Not checked  Other studies Reviewed: Additional studies/ records that  were reviewed today include:  Recent Labs: February 2019: TC 228, TG 74, LDL 143, HDL 71.  BUN/creatinine 20/0.93.  TSH 1.04.  A1c 12.6.  ASSESSMENT / PLAN: Problem List Items Addressed This Visit    Orthostatic syncope - Primary   Essential hypertension     Stacey Fuentes seems to be doing very well without any recurrent episodes of syncope since stopping her blood pressure medications.  She did have orthostatic changes when I saw her, and her baseline hypertension is relatively well controlled.  As long as her systolic pressures do not average above 150 to 160 mmHg, she is probably okay with permissive hypertension.  Continue to encourage adequate hydration, and intermittently monitor blood pressure.  She can follow-up on an as-needed basis.  Current medicines are reviewed at length with  the patient today.  (+/- concerns) N/A The following changes have been made:  Agree with no blood pressure medications.  Patient Instructions  NO CHANGES WITH MEDICATIONS     Your physician recommends that you schedule a follow-up appointment ON AS NEEDED BASIS        Studies Ordered:   No orders of the defined types were placed in this encounter.     Glenetta Hew, M.D., M.S. Interventional Cardiologist   Pager # [phone] Phone # [phone] [address] [place], [place] [address]   Thank you for choosing [facility] at [facility]!!

## 2017-07-12 ENCOUNTER — Encounter: Payer: Self-pay | Admitting: Cardiology

## 2017-07-12 DIAGNOSIS — I10 Essential (primary) hypertension: Secondary | ICD-10-CM | POA: Insufficient documentation

## 2017-09-25 ENCOUNTER — Emergency Department (HOSPITAL_COMMUNITY): Payer: Medicare Other

## 2017-09-25 ENCOUNTER — Other Ambulatory Visit: Payer: Self-pay

## 2017-09-25 ENCOUNTER — Emergency Department (HOSPITAL_COMMUNITY)
Admission: EM | Admit: 2017-09-25 | Discharge: 2017-09-25 | Disposition: A | Discharge status: Home / Self Care | Payer: Medicare Other | Attending: Emergency Medicine | Admitting: Emergency Medicine

## 2017-09-25 ENCOUNTER — Encounter (HOSPITAL_COMMUNITY): Payer: Self-pay

## 2017-09-25 DIAGNOSIS — R0789 Other chest pain: Secondary | ICD-10-CM | POA: Diagnosis not present

## 2017-09-25 DIAGNOSIS — Z79899 Other long term (current) drug therapy: Secondary | ICD-10-CM | POA: Diagnosis not present

## 2017-09-25 DIAGNOSIS — R110 Nausea: Secondary | ICD-10-CM | POA: Diagnosis not present

## 2017-09-25 DIAGNOSIS — Z794 Long term (current) use of insulin: Secondary | ICD-10-CM | POA: Diagnosis not present

## 2017-09-25 DIAGNOSIS — Z7982 Long term (current) use of aspirin: Secondary | ICD-10-CM | POA: Insufficient documentation

## 2017-09-25 DIAGNOSIS — R1011 Right upper quadrant pain: Secondary | ICD-10-CM | POA: Diagnosis not present

## 2017-09-25 DIAGNOSIS — I10 Essential (primary) hypertension: Secondary | ICD-10-CM | POA: Insufficient documentation

## 2017-09-25 DIAGNOSIS — E1140 Type 2 diabetes mellitus with diabetic neuropathy, unspecified: Secondary | ICD-10-CM | POA: Insufficient documentation

## 2017-09-25 DIAGNOSIS — R61 Generalized hyperhidrosis: Secondary | ICD-10-CM | POA: Insufficient documentation

## 2017-09-25 DIAGNOSIS — K760 Fatty (change of) liver, not elsewhere classified: Secondary | ICD-10-CM | POA: Diagnosis not present

## 2017-09-25 LAB — URINALYSIS, ROUTINE W REFLEX MICROSCOPIC
Bilirubin Urine: NEGATIVE
Glucose, UA: NEGATIVE mg/dL
HGB URINE DIPSTICK: NEGATIVE
Ketones, ur: NEGATIVE mg/dL
LEUKOCYTES UA: NEGATIVE
Nitrite: NEGATIVE
PROTEIN: NEGATIVE mg/dL
Specific Gravity, Urine: 1.014 (ref 1.005–1.030)
pH: 7 (ref 5.0–8.0)

## 2017-09-25 LAB — CBC WITH DIFFERENTIAL/PLATELET
Basophils Absolute: 0 10*3/uL (ref 0.0–0.1)
Basophils Relative: 0 %
EOS PCT: 2 %
Eosinophils Absolute: 0.1 10*3/uL (ref 0.0–0.7)
HCT: 41.3 % (ref 36.0–46.0)
Hemoglobin: 13.5 g/dL (ref 12.0–15.0)
LYMPHS ABS: 2.2 10*3/uL (ref 0.7–4.0)
LYMPHS PCT: 31 %
MCH: 30.8 pg (ref 26.0–34.0)
MCHC: 32.7 g/dL (ref 30.0–36.0)
MCV: 94.1 fL (ref 78.0–100.0)
MONO ABS: 0.4 10*3/uL (ref 0.1–1.0)
Monocytes Relative: 6 %
Neutro Abs: 4.4 10*3/uL (ref 1.7–7.7)
Neutrophils Relative %: 61 %
PLATELETS: 142 10*3/uL — AB (ref 150–400)
RBC: 4.39 MIL/uL (ref 3.87–5.11)
RDW: 12.2 % (ref 11.5–15.5)
WBC: 7.1 10*3/uL (ref 4.0–10.5)

## 2017-09-25 LAB — I-STAT TROPONIN, ED
TROPONIN I, POC: 0 ng/mL (ref 0.00–0.08)
TROPONIN I, POC: 0 ng/mL (ref 0.00–0.08)

## 2017-09-25 LAB — COMPREHENSIVE METABOLIC PANEL
ALT: 14 U/L (ref 0–44)
AST: 19 U/L (ref 15–41)
Albumin: 3.6 g/dL (ref 3.5–5.0)
Alkaline Phosphatase: 59 U/L (ref 38–126)
Anion gap: 8 (ref 5–15)
BUN: 23 mg/dL (ref 8–23)
CHLORIDE: 106 mmol/L (ref 98–111)
CO2: 28 mmol/L (ref 22–32)
CREATININE: 0.87 mg/dL (ref 0.44–1.00)
Calcium: 9.5 mg/dL (ref 8.9–10.3)
Glucose, Bld: 209 mg/dL — ABNORMAL HIGH (ref 70–99)
POTASSIUM: 4.3 mmol/L (ref 3.5–5.1)
Sodium: 142 mmol/L (ref 135–145)
Total Bilirubin: 0.5 mg/dL (ref 0.3–1.2)
Total Protein: 6.8 g/dL (ref 6.5–8.1)

## 2017-09-25 LAB — LIPASE, BLOOD: LIPASE: 21 U/L (ref 11–51)

## 2017-09-25 MED ORDER — SODIUM CHLORIDE 0.9 % IV BOLUS
1000.0000 mL | Freq: Once | INTRAVENOUS | Status: AC
  Administered 2017-09-25: 1000 mL via INTRAVENOUS

## 2017-09-25 MED ORDER — ONDANSETRON HCL 4 MG/2ML IJ SOLN
4.0000 mg | Freq: Once | INTRAMUSCULAR | Status: AC
  Administered 2017-09-25: 4 mg via INTRAVENOUS
  Filled 2017-09-25: qty 2

## 2017-09-25 MED ORDER — GI COCKTAIL ~~LOC~~
30.0000 mL | Freq: Once | ORAL | Status: AC
  Administered 2017-09-25: 30 mL via ORAL
  Filled 2017-09-25: qty 30

## 2017-09-25 MED ORDER — IOPAMIDOL (ISOVUE-300) INJECTION 61%
100.0000 mL | Freq: Once | INTRAVENOUS | Status: AC | PRN
  Administered 2017-09-25: 100 mL via INTRAVENOUS

## 2017-09-25 MED ORDER — FAMOTIDINE 20 MG PO TABS: 20.0000 mg | ORAL_TABLET | Freq: Two times a day (BID) | ORAL | 0 refills | Status: DC | PRN

## 2017-09-25 MED ORDER — IOPAMIDOL (ISOVUE-300) INJECTION 61%
INTRAVENOUS | Status: AC
  Filled 2017-09-25: qty 100

## 2017-09-25 MED ORDER — FAMOTIDINE IN NACL 20-0.9 MG/50ML-% IV SOLN
20.0000 mg | Freq: Once | INTRAVENOUS | Status: AC
  Administered 2017-09-25: 20 mg via INTRAVENOUS
  Filled 2017-09-25: qty 50

## 2017-09-25 MED ORDER — ESOMEPRAZOLE MAGNESIUM 40 MG PO CPDR: 40.0000 mg | DELAYED_RELEASE_CAPSULE | Freq: Every day | ORAL | 0 refills | Status: DC

## 2017-09-25 NOTE — ED Notes (Signed)
Blood sent to lab

## 2017-09-25 NOTE — ED Notes (Signed)
Pt placed on bedpan

## 2017-09-25 NOTE — ED Notes (Signed)
Pt has pure wick in place. Pt has not produced any urine at this time.

## 2017-09-25 NOTE — ED Triage Notes (Signed)
Pt c/o chest pain, diaphoresis and nausea since last night

## 2017-09-25 NOTE — ED Notes (Signed)
US at bedside

## 2017-09-25 NOTE — Discharge Instructions (Signed)
Take nexium daily.   Take pepcid 20 mg twice daily as needed for nausea.   You have fatty liver. Follow up with GI doctor. Eat low fat diet   See your doctor   Return to ER if you have worse chest pain, abdominal pain, vomiting, fever, dehydration

## 2017-09-25 NOTE — ED Notes (Signed)
ED phlebotomy to come draw labs. IV will not draw back

## 2017-09-25 NOTE — ED Notes (Signed)
Patient transported to X-ray 

## 2017-09-25 NOTE — ED Notes (Signed)
Phlebotomy at bedside.

## 2017-09-25 NOTE — ED Notes (Signed)
Blood collected and sent to lab.

## 2017-09-25 NOTE — ED Notes (Signed)
Attempted to draw blood off IV.

## 2017-09-25 NOTE — ED Notes (Signed)
Unsuccessful IV attempts times 2

## 2017-09-25 NOTE — ED Provider Notes (Signed)
[facility] DEPT Provider Note   CSN: [id number] Arrival date & time: 09/25/17  1039     History   Chief Complaint Chief Complaint  Patient presents with  . Chest Pain  . Nausea    HPI Stacey Fuentes is a 82 y.o. female history of diabetes, hyperlipidemia, here presenting with chest pain, diaphoresis, nausea.  Patient states that he has been having some nausea for the last 2 days.  Also some associated chest pain as well.  She states that the pain is on the right side and is worse with movement.  No associated shortness of breath.  She thought it was gas and has been taking Gas-X with no relief.  Patient's pain got worse yesterday and was associated with an episode of diaphoresis and nausea but no vomiting.  Denies any diarrhea or fevers.  Patient was concerned that she may have some gallbladder disease.  Patient saw cardiology several months ago and had a unremarkable echo.  Patient denies any recent travel or history of blood clots or history of coronary artery disease.   The history is provided by the patient.    Past Medical History:  Diagnosis Date  . Benign essential tremor   . Cataract   . Diabetes mellitus ([place])   . Full dentures   . Hyperlipidemia   . Uncontrolled diabetes mellitus type 2 without complications ([place]) 10/08/9369  . Wears glasses     Patient Active Problem List   Diagnosis Date Noted  . Essential hypertension 07/12/2017  . Orthostatic syncope 06/11/2017  . Aortic ejection murmur 06/11/2017  . Uncontrolled type 2 diabetes mellitus with nephropathy ([place]) 03/24/2014  . Upper abdominal pain 03/23/2014  . Nausea 03/23/2014  . Abnormal findings on radiological examination of gastrointestinal tract 03/23/2014  . Infected sebaceous cyst 04/15/2013    Past Surgical History:  Procedure Laterality Date  . APPENDECTOMY     childhood  . COLONOSCOPY     in [place] in 2000  . INCISION AND DRAINAGE ABSCESS N/A 05/18/2013   Procedure: EXCISION OF SEBACEOUS CYST BACK;  Surgeon: Merrie Roof, MD;  Location: [facility];  Service: General;  Laterality: N/A;  . surgery to remove cyst on back  2010  . TRANSTHORACIC ECHOCARDIOGRAM  06/17/2017    EF 60-65%.  Normal wall motion.  GR 1 DD.  No obvious valvular lesions.     OB History   None      Home Medications    Prior to Admission medications   Medication Sig Start Date End Date Taking? Authorizing Provider  aspirin 81 MG tablet Take 81 mg by mouth daily.   Yes [provider]  calcium carbonate 200 MG capsule Take 1 capsule by mouth as directed   Yes [provider]  Insulin Glargine (LANTUS SOLOSTAR) 100 UNIT/ML Solostar Pen Inject 30 units into the skin at bedtime 06/26/17  Yes Philemon Kingdom, MD  latanoprost (XALATAN) 0.005 % ophthalmic solution Place 1 drop into both eyes every evening. 07/11/17  Yes [provider]  Omega-3 Fatty Acids (FISH OIL) 1000 MG CAPS Take 1 capsule by mouth daily.   Yes [provider]  ACCU-CHEK SOFTCLIX LANCETS lancets Use to check blood sugars twice daily 04/28/17   Philemon Kingdom, MD  Blood Glucose Monitoring Suppl (ACCU-CHEK AVIVA) device Use as instructed 04/28/17 04/28/18  Philemon Kingdom, MD  glucose blood (ACCU-CHEK AVIVA) test strip Use to check blood sugars twice daily 04/28/17   Philemon Kingdom, MD  Insulin Pen Needle (CAREFINE PEN NEEDLES) 32G X 4 MM MISC Use 4x a day 03/24/14   Philemon Kingdom, MD    Family History Family History  Problem Relation Age of Onset  . Colon cancer Neg Hx   . Esophageal cancer Neg Hx   . Rectal cancer Neg Hx   . Stomach cancer Neg Hx     Social History Social History   Tobacco Use  . Smoking status: Never Smoker  . Smokeless tobacco: Never Used  Substance Use Topics  . Alcohol use: No  . Drug use: No     Allergies   Metformin and related   Review of Systems Review of Systems  Cardiovascular: Positive for chest  pain.  All other systems reviewed and are negative.    Physical Exam Updated Vital Signs BP (!) 159/90   Pulse 84   Temp 97.8 F (36.6 C) (Oral)   Resp 16   Ht 5\' 4"  (1.626 m)   Wt 79.4 kg   SpO2 96%   BMI 30.04 kg/m   Physical Exam  Constitutional: She is oriented to person, place, and time.  Chronically ill   HENT:  Head: Normocephalic.  Eyes: Pupils are equal, round, and reactive to light.  Neck: Normal range of motion. Neck supple.  Cardiovascular: Normal rate, regular rhythm and normal pulses.  Pulmonary/Chest: Effort normal and breath sounds normal.  Abdominal: Soft. Bowel sounds are normal.  Minimal RUQ vs epigastric tenderness, no murphy's sign   Musculoskeletal: Normal range of motion.       Right lower leg: Normal. She exhibits no tenderness and no edema.       Left lower leg: Normal. She exhibits no tenderness and no edema.  Neurological: She is alert and oriented to person, place, and time.  Skin: Skin is warm.  Psychiatric: She has a normal mood and affect. Her behavior is normal.  Nursing note and vitals reviewed.    ED Treatments / Results  Labs (all labs ordered are listed, but only abnormal results are displayed) Labs Reviewed  CBC WITH DIFFERENTIAL/PLATELET - Abnormal; Notable for the following components:      Result Value   Platelets 142 (*)    All other components within normal limits  COMPREHENSIVE METABOLIC PANEL - Abnormal; Notable for the following components:   Glucose, Bld 209 (*)    All other components within normal limits  LIPASE, BLOOD  URINALYSIS, ROUTINE W REFLEX MICROSCOPIC  CBC WITH DIFFERENTIAL/PLATELET  I-STAT TROPONIN, ED  I-STAT TROPONIN, ED    EKG EKG Interpretation  Date/Time:  Friday September 25 2017 11:10:06 EDT Ventricular Rate:  77 PR Interval:    QRS Duration: 76 QT Interval:  360 QTC Calculation: 408 R Axis:   59 Text Interpretation:  Sinus rhythm Low voltage, precordial leads ST elevation, consider  inferior injury No significant change since last tracing Confirmed by Wandra Arthurs [phone]) on 09/25/2017 11:20:22 AM   Radiology Dg Chest 2 View  Result Date: 09/25/2017 CLINICAL DATA:  Pt c/o chest pain, diaphoresis and nausea since last night. Diabetic. EXAM: CHEST - 2 VIEW COMPARISON:  02/20/2011 FINDINGS: No overt edema or focal airspace consolidation. Low lung volumes on the lateral projection with bibasilar atelectasis. Heart size and mediastinal contours are within normal limits. Aortic Atherosclerosis (ICD10-170.0) and tortuosity. No effusion.  No pneumothorax. Visualized bones unremarkable. IMPRESSION: No acute cardiopulmonary disease. Electronically Signed   By: Lucrezia Europe M.D.   On: 09/25/2017 12:27   Ct Abdomen Pelvis W  Contrast  Result Date: 09/25/2017 CLINICAL DATA:  Chest pain, diaphoresis and nausea since last night. EXAM: CT ABDOMEN AND PELVIS WITH CONTRAST TECHNIQUE: Multidetector CT imaging of the abdomen and pelvis was performed using the standard protocol following bolus administration of intravenous contrast. CONTRAST:  161mL ISOVUE-300 IOPAMIDOL (ISOVUE-300) INJECTION 61% COMPARISON:  Right upper quadrant abdomen ultrasound obtained earlier today and abdomen and pelvis CT dated 03/20/2014. FINDINGS: Lower chest: Minimal bilateral dependent atelectasis/scarring. Hepatobiliary: Mild diffuse low density of the liver relative to the spleen. Normal appearing gallbladder. Pancreas: Moderate diffuse pancreatic atrophy. Spleen: Normal in size without focal abnormality. Adrenals/Urinary Tract: Adrenal glands are unremarkable. Kidneys are normal, without renal calculi, focal lesion, or hydronephrosis. Bladder is unremarkable. Stomach/Bowel: Mild right colon diverticulosis. Surgically absent appendix. Unremarkable stomach and small bowel. Vascular/Lymphatic: Atheromatous arterial calcifications without aneurysm. No enlarged lymph nodes. Reproductive: Multiple calcified uterine fibroids. No  adnexal masses. Other: No abdominal wall hernia or abnormality. No abdominopelvic ascites. Musculoskeletal: Lumbar and lower thoracic spine degenerative changes. Interval approximately 25% L1 superior endplate compression deformity with mild bony retropulsion and Schmorl's node formation. No acute fracture lines seen. IMPRESSION: 1. No acute abnormality. 2. Mild diffuse hepatic steatosis. 3. Mild right colon diverticulosis. 4. Calcified uterine fibroids. Electronically Signed   By: Claudie Revering M.D.   On: 09/25/2017 15:46   US Abdomen Limited Ruq  Result Date: 09/25/2017 CLINICAL DATA:  Right upper quadrant abdominal pain for 3 days. History of diabetes. EXAM: ULTRASOUND ABDOMEN LIMITED RIGHT UPPER QUADRANT COMPARISON:  Ultrasound 03/17/2014.  CT 03/20/2014. FINDINGS: Gallbladder: No gallstones or wall thickening visualized. No sonographic Murphy sign noted by sonographer. Common bile duct: Diameter: 11 mm, similar to previous examinations. No intraductal calculi identified. Liver: No focal lesion identified. Within normal limits in parenchymal echogenicity. Portal vein is patent on color Doppler imaging with normal direction of blood flow towards the liver. IMPRESSION: Extrahepatic biliary dilatation, similar to previous studies. Correlation with pending liver function studies recommended. The gallbladder and liver appear unremarkable. Electronically Signed   By: Richardean Sale M.D.   On: 09/25/2017 12:49    Procedures Procedures (including critical care time)  Angiocath insertion Performed by: Wandra Arthurs  Consent: Verbal consent obtained. Risks and benefits: risks, benefits and alternatives were discussed Time out: Immediately prior to procedure a "time out" was called to verify the correct patient, procedure, equipment, support staff and site/side marked as required.  Preparation: Patient was prepped and draped in the usual sterile fashion.  Vein Location: L antecube  Ultrasound  Guided  Gauge: 20 long   Normal blood return and flush without difficulty Patient tolerance: Patient tolerated the procedure well with no immediate complications.     Medications Ordered in ED Medications  iopamidol (ISOVUE-300) 61 % injection (has no administration in time range)  sodium chloride 0.9 % bolus 1,000 mL (0 mLs Intravenous Stopped 09/25/17 1506)  ondansetron (ZOFRAN) injection 4 mg (4 mg Intravenous Given 09/25/17 1222)  famotidine (PEPCID) IVPB 20 mg premix (0 mg Intravenous Stopped 09/25/17 1416)  gi cocktail (Maalox,Lidocaine,Donnatal) (30 mLs Oral Given 09/25/17 1346)  iopamidol (ISOVUE-300) 61 % injection 100 mL (100 mLs Intravenous Contrast Given 09/25/17 1517)     Initial Impression / Assessment and Plan / ED Course  I have reviewed the triage vital signs and the nursing notes.  Pertinent labs & imaging results that were available during my care of the patient were reviewed by me and considered in my medical decision making (see chart for details).  Lyana Asbill is a 82 y.o. female here with abdominal pain, chest pain, nausea. Consider gastritis vs biliary colic vs ACS. I doubt PE or dissection. Will get labs, trop x 2, lipase, RUQ [place], CXR.   2 pm US showed extra biliary dilation. LFTs nl. Trop neg. Still nauseated and has RUQ pain. Will get CT ab/pel to further workup abdominal pain.   3:55 PM UA nl, CT ab/pel hepatic steatosis. Pain improved after pepcid, GI cocktail. I suspect some gastritis vs gas. Will start on nexium, pepcid prn. Will refer to GI outpatient. Trop neg x 2. Chest pain free currently.    Final Clinical Impressions(s) / ED Diagnoses   Final diagnoses:  RUQ pain    ED Discharge Orders    None       Drenda Freeze, MD 09/25/17 1557

## 2017-09-25 NOTE — ED Notes (Addendum)
Lab called to notify staff that blood collected had hemolyzed and needs recollection

## 2017-09-25 NOTE — ED Notes (Signed)
Pt returned from CT at this time.  

## 2017-10-30 ENCOUNTER — Ambulatory Visit: Payer: Medicare Other | Admitting: Internal Medicine

## 2018-03-21 ENCOUNTER — Other Ambulatory Visit: Payer: Self-pay | Admitting: Internal Medicine

## 2018-09-12 ENCOUNTER — Other Ambulatory Visit: Payer: Self-pay | Admitting: Internal Medicine

## 2018-12-10 ENCOUNTER — Other Ambulatory Visit: Payer: Self-pay | Admitting: Internal Medicine

## 2019-05-18 ENCOUNTER — Other Ambulatory Visit: Payer: Self-pay

## 2019-05-18 ENCOUNTER — Emergency Department (HOSPITAL_COMMUNITY)
Admission: EM | Admit: 2019-05-18 | Discharge: 2019-05-18 | Disposition: A | Discharge status: Home / Self Care | Payer: Medicare Other | Attending: Emergency Medicine | Admitting: Emergency Medicine

## 2019-05-18 DIAGNOSIS — I10 Essential (primary) hypertension: Secondary | ICD-10-CM | POA: Insufficient documentation

## 2019-05-18 DIAGNOSIS — Z794 Long term (current) use of insulin: Secondary | ICD-10-CM | POA: Diagnosis not present

## 2019-05-18 DIAGNOSIS — E119 Type 2 diabetes mellitus without complications: Secondary | ICD-10-CM | POA: Diagnosis not present

## 2019-05-18 DIAGNOSIS — R252 Cramp and spasm: Secondary | ICD-10-CM | POA: Insufficient documentation

## 2019-05-18 LAB — COMPREHENSIVE METABOLIC PANEL
ALT: 15 U/L (ref 0–44)
AST: 23 U/L (ref 15–41)
Albumin: 3.9 g/dL (ref 3.5–5.0)
Alkaline Phosphatase: 59 U/L (ref 38–126)
Anion gap: 8 (ref 5–15)
BUN: 25 mg/dL — ABNORMAL HIGH (ref 8–23)
CO2: 31 mmol/L (ref 22–32)
Calcium: 9.5 mg/dL (ref 8.9–10.3)
Chloride: 100 mmol/L (ref 98–111)
Creatinine, Ser: 1.01 mg/dL — ABNORMAL HIGH (ref 0.44–1.00)
GFR calc Af Amer: >60 mL/min (ref >60)
GFR calc non Af Amer: 52 mL/min — ABNORMAL LOW (ref >60)
Glucose, Bld: 222 mg/dL — ABNORMAL HIGH (ref 70–99)
Potassium: 4.7 mmol/L (ref 3.5–5.1)
Sodium: 139 mmol/L (ref 135–145)
Total Bilirubin: 0.6 mg/dL (ref 0.3–1.2)
Total Protein: 7.3 g/dL (ref 6.5–8.1)

## 2019-05-18 LAB — CBC
HCT: 39.9 % (ref 36.0–46.0)
Hemoglobin: 12.4 g/dL (ref 12.0–15.0)
MCH: 31 pg (ref 26.0–34.0)
MCHC: 31.1 g/dL (ref 30.0–36.0)
MCV: 99.8 fL (ref 80.0–100.0)
Platelets: 150 10*3/uL (ref 150–400)
RBC: 4 MIL/uL (ref 3.87–5.11)
RDW: 12.2 % (ref 11.5–15.5)
WBC: 7.4 10*3/uL (ref 4.0–10.5)
nRBC: 0 % (ref 0.0–0.2)

## 2019-05-18 LAB — LIPASE, BLOOD: Lipase: 17 U/L (ref 11–51)

## 2019-05-18 MED ORDER — AMLODIPINE BESYLATE 5 MG PO TABS
5.0000 mg | ORAL_TABLET | Freq: Once | ORAL | Status: AC
  Administered 2019-05-18: 5 mg via ORAL
  Filled 2019-05-18: qty 1

## 2019-05-18 MED ORDER — AMLODIPINE BESYLATE 5 MG PO TABS: 5.0000 mg | ORAL_TABLET | Freq: Every day | ORAL | 0 refills | Status: DC

## 2019-05-18 MED ORDER — IBUPROFEN 600 MG PO TABS
600.0000 mg | ORAL_TABLET | Freq: Four times a day (QID) | ORAL | 0 refills | Status: DC | PRN
Start: 2019-05-18 — End: 2020-11-14

## 2019-05-18 NOTE — Discharge Instructions (Addendum)
You were seen in the emergency department today with concerns for high blood pressure.  As we discussed, there are many potential causes for swings in your blood pressure, including pain.  This may be why your blood pressure has been high today when you checked it.  High blood pressure should be managed by your primary care provider.  I felt it was reasonable to start you on a low-dose of blood pressure medicine called amlodipine.  You should take this once a day in the morning.  Prior to taking it, you should check your blood pressure.   - If your systolic blood pressure (top number) is over 150 mm hg, take 10 mg amlodipine (2 tablets) - If your systolic blood pressure (top number) is between 130-150 mm hg, take 5 mg amlodipine (1 tablet) - If your systolic blood pressure is below 130 mmhg, do NOT take the amlodipine that day  Keep a daily diary of your blood pressures to take to your doctor.  It is more helpful to have a record of many days than a single day.  Should also follow-up with your primary care provider about your ER visit today.  Your doctor may wish to switch you to a different class of medication.  Be aware that salty food and salt can drive up your blood pressure.

## 2019-05-18 NOTE — ED Provider Notes (Signed)
[facility] DEPT Provider Note   CSN: [id number] Arrival date & time: 05/18/19  1344     History Chief Complaint  Patient presents with  . Hypertension    Stacey Fuentes is a 84 y.o. female history hypertension presenting to the emergency department with high blood pressure.  Her daughter is her caretaker reports that the patient's blood pressure has been very labile today.  The patient is complaining of cramping pain in her arms and legs today, which she has eventually but seemed more severe.  Her daughter took her blood pressure at home and it was 169/75, then repeated it and it was 175/75.  She tells me that the patient been taken off of her blood pressure medicine about a year ago by her cardiologist because her blood pressure became too low at home when she was having syncopal or near syncopal episodes.  Daughter is not habitually been checking the pressure but a few weeks ago said it was 125/60.  They have not had any issues with hypertension they are aware of until today.  Currently in the ED the patient has no acute complaints.  She denies a headache or any chest pain. She denies blurred vision. She has some earlier cramping in her hands and feet, this is intermittent.  Her daughter says she gives [place] some "salt" a teaspoon when her hands begin to hurt, but not every day    HPI     Past Medical History:  Diagnosis Date  . Benign essential tremor   . Cataract   . Diabetes mellitus ([place])   . Full dentures   . Hyperlipidemia   . Uncontrolled diabetes mellitus type 2 without complications ([place]) 0000000  . Wears glasses     Patient Active Problem List   Diagnosis Date Noted  . Essential hypertension 07/12/2017  . Orthostatic syncope 06/11/2017  . Aortic ejection murmur 06/11/2017  . Uncontrolled type 2 diabetes mellitus with nephropathy ([facility]) 03/24/2014  . Upper abdominal pain 03/23/2014  . Nausea 03/23/2014  . Abnormal findings on  radiological examination of gastrointestinal tract 03/23/2014  . Infected sebaceous cyst 04/15/2013    Past Surgical History:  Procedure Laterality Date  . APPENDECTOMY     childhood  . COLONOSCOPY     in [place] in 2000  . INCISION AND DRAINAGE ABSCESS N/A 05/18/2013   Procedure: EXCISION OF SEBACEOUS CYST BACK;  Surgeon: Merrie Roof, MD;  Location: [facility];  Service: General;  Laterality: N/A;  . surgery to remove cyst on back  2010  . TRANSTHORACIC ECHOCARDIOGRAM  06/17/2017    EF 60-65%.  Normal wall motion.  GR 1 DD.  No obvious valvular lesions.     OB History   No obstetric history on file.     Family History  Problem Relation Age of Onset  . Colon cancer Neg Hx   . Esophageal cancer Neg Hx   . Rectal cancer Neg Hx   . Stomach cancer Neg Hx     Social History   Tobacco Use  . Smoking status: Never Smoker  . Smokeless tobacco: Never Used  Substance Use Topics  . Alcohol use: No  . Drug use: No    Home Medications Prior to Admission medications   Medication Sig Start Date End Date Taking? Authorizing Provider  ACCU-CHEK SOFTCLIX LANCETS lancets Use to check blood sugars twice daily 04/28/17   Philemon Kingdom, MD  amLODipine (NORVASC) 5 MG tablet Take 1 tablet (5 mg  total) by mouth daily. 05/18/19 06/17/19  Wyvonnia Dusky, MD  aspirin 81 MG tablet Take 81 mg by mouth daily.    [provider]  calcium carbonate 200 MG capsule Take 1 capsule by mouth as directed    [provider]  esomeprazole (NEXIUM) 40 MG capsule Take 1 capsule (40 mg total) by mouth daily. 09/25/17   Drenda Freeze, MD  famotidine (PEPCID) 20 MG tablet Take 1 tablet (20 mg total) by mouth 2 (two) times daily as needed for heartburn or indigestion. 09/25/17   Drenda Freeze, MD  glucose blood (ACCU-CHEK AVIVA) test strip Use to check blood sugars twice daily 04/28/17   Philemon Kingdom, MD  ibuprofen (ADVIL) 600 MG tablet Take 1 tablet (600 mg total)  by mouth every 6 (six) hours as needed for mild pain or moderate pain. 05/18/19   Wyvonnia Dusky, MD  Insulin Pen Needle (CAREFINE PEN NEEDLES) 32G X 4 MM MISC Use 4x a day 03/24/14   Philemon Kingdom, MD  LANTUS SOLOSTAR 100 UNIT/ML Solostar Pen INJECT SUBCUTANEOUSLY 30  UNITS ONCE DAILY AT BEDTIME 12/10/18   Philemon Kingdom, MD  latanoprost (XALATAN) 0.005 % ophthalmic solution Place 1 drop into both eyes every evening. 07/11/17   [provider]  Omega-3 Fatty Acids (FISH OIL) 1000 MG CAPS Take 1 capsule by mouth daily.    [provider]    Allergies    Metformin and related  Review of Systems   Review of Systems  Constitutional: Negative for chills and fever.  HENT: Negative for sinus pressure and sinus pain.   Eyes: Negative for photophobia and visual disturbance.  Respiratory: Negative for cough and shortness of breath.   Cardiovascular: Negative for chest pain and palpitations.  Gastrointestinal: Negative for abdominal pain and vomiting.  Musculoskeletal: Positive for arthralgias and myalgias.  Skin: Negative for color change and rash.  Neurological: Negative for syncope, facial asymmetry, speech difficulty, light-headedness and headaches.  Psychiatric/Behavioral: Positive for agitation. Negative for confusion.  All other systems reviewed and are negative.   Physical Exam Updated Vital Signs BP 138/76 (BP Location: Right Arm)   Pulse 90   Temp 97.9 F (36.6 C) (Oral)   Resp (!) 23   Ht 5\' 4"  (1.626 m)   Wt 73.5 kg   SpO2 100%   BMI 27.81 kg/m   Physical Exam Vitals and nursing note reviewed.  Constitutional:      General: She is not in acute distress.    Appearance: She is well-developed.  HENT:     Head: Normocephalic and atraumatic.  Eyes:     Conjunctiva/sclera: Conjunctivae normal.     Pupils: Pupils are equal, round, and reactive to light.  Cardiovascular:     Rate and Rhythm: Normal rate and regular rhythm.  Pulmonary:     Effort:  Pulmonary effort is normal. No respiratory distress.  Musculoskeletal:        General: No swelling or deformity.     Cervical back: Neck supple.     Comments: No reproducible pain on palpation of hands  Skin:    General: Skin is warm and dry.  Neurological:     General: No focal deficit present.     Mental Status: She is alert and oriented to person, place, and time.  Psychiatric:        Mood and Affect: Mood normal.        Behavior: Behavior normal.     ED Results / Procedures /  Treatments   Labs (all labs ordered are listed, but only abnormal results are displayed) Labs Reviewed  COMPREHENSIVE METABOLIC PANEL - Abnormal; Notable for the following components:      Result Value   Glucose, Bld 222 (*)    BUN 25 (*)    Creatinine, Ser 1.01 (*)    GFR calc non Af Amer 52 (*)    All other components within normal limits  LIPASE, BLOOD  CBC    EKG EKG Interpretation  Date/Time:  Wednesday May 18 2019 16:32:44 EDT Ventricular Rate:  92 PR Interval:    QRS Duration: 74 QT Interval:  343 QTC Calculation: 425 R Axis:   52 Text Interpretation: Sinus rhythm NO STEMI, no significant change from prior ecg Confirmed by Octaviano Glow [phone]) on 05/18/2019 4:39:38 PM   Radiology No results found.  Procedures Procedures (including critical care time)  Medications Ordered in ED Medications  amLODipine (NORVASC) tablet 5 mg (5 mg Oral Given 05/18/19 1736)    ED Course  I have reviewed the triage vital signs and the nursing notes.  Pertinent labs & imaging results that were available during my care of the patient were reviewed by me and considered in my medical decision making (see chart for details).  This is a well appearing 84 yo female here with family concerns for hypertension as well as cramping in her hands.  Her daughter at bedside expresses concern that her mother is having worsening cramping than usual in her hands, and that her blood pressure has been high today.   The patient was taken off BP meds in the past because of LOW blood pressure and concern for syncope.  They have not tracked recent BP's and our last record is from 2019.  It's not clear what her average blood pressure has been over the past few weeks.   Her workup here is unremarkable.  Specifically her ECG is benign, no AKI.  I do not believe this is hypertensive emergency.  She has no signs or symptoms of a stroke, and no headache currently.  I had a long discussion with her daughter about the emergency department's role in managing hypertension.  I explained that blood pressure is quite labile and can be affected by things like pain (ie hand cramping) and other illnesses, as well as agitation and commotion (like visiting he ER).   I do not think her mother is having malignant hypertension, or that her blood pressure is related to her hand cramping pain.  I am willing to initiate BP meds here under the condition that she checks her mother's BP daily before giving the meds, and that we can titrate the meds at home.  I dictated this in her discharge instructions.  I told her to call Dr Orest Dikes office for an appointment this week and to keep a diary of her mother's blood pressures.  I also think 1 week of NSAIDS may be helpful for her mother's hand pain - I would not advice prolonged NSAIDS given her age.  Clinical Course as of May 18 21  Wed May 18, 2019  1848 Patient's daughter expressed concerns about the patient's continued elevated blood pressure.  I explained to her that based on ACEP society guidelines, we do not treat or manage asymptomatic hypertension in the ED. We start established plan to discharge her on amlodipine 5 mg   [MT]  1852 Manual BP 138/76   [MT]    Clinical Course User Index [MT] Wyvonnia Dusky,  MD    Final Clinical Impression(s) / ED Diagnoses Final diagnoses:  Hypertension, unspecified type    Rx / DC Orders ED Discharge Orders         Ordered    amLODipine  (NORVASC) 5 MG tablet  Daily     05/18/19 1700    ibuprofen (ADVIL) 600 MG tablet  Every 6 hours PRN     05/18/19 1855           Wyvonnia Dusky, MD 05/19/19 [id number]

## 2019-05-18 NOTE — ED Notes (Signed)
Delay in discharge due to pts family member requesting medication to lower the pts BP. MD made aware.

## 2019-05-18 NOTE — ED Triage Notes (Addendum)
Pt reports that she experienced cramping in hands and face. Pt was rx'd BP meds but doesn't take it. Pt daughter monitors BP weekly and cardiologist advised to stop meds d/t syncope episodes r/t hypotension. Pt B/P at home was 145/75 30 mins later 175/75

## 2019-08-09 ENCOUNTER — Ambulatory Visit: Payer: Medicare Other | Admitting: Internal Medicine

## 2019-09-05 ENCOUNTER — Encounter: Payer: Self-pay | Admitting: *Deleted

## 2019-09-06 ENCOUNTER — Ambulatory Visit (INDEPENDENT_AMBULATORY_CARE_PROVIDER_SITE_OTHER): Payer: Medicare Other | Admitting: Diagnostic Neuroimaging

## 2019-09-06 ENCOUNTER — Encounter: Payer: Self-pay | Admitting: Diagnostic Neuroimaging

## 2019-09-06 VITALS — BP 139/78 | HR 85 | Ht 63.0 in | Wt 175.0 lb

## 2019-09-06 DIAGNOSIS — G3183 Dementia with Lewy bodies: Secondary | ICD-10-CM | POA: Diagnosis not present

## 2019-09-06 DIAGNOSIS — R413 Other amnesia: Secondary | ICD-10-CM

## 2019-09-06 NOTE — Patient Instructions (Signed)
  Memory loss (possible dementia with lewy bodies) - check MRI brain - consider memantine 10mg  at bedtime; increase to twice a day after 1-2 weeks - safety / supervision issues reviewed - caregiver resources provided

## 2019-09-06 NOTE — Progress Notes (Signed)
[facility]  PATIENT: Stacey Fuentes DOB: Aug 12, 1935  REFERRING CLINICIAN: Harlan Stains, MD HISTORY FROM: patient, daughters REASON FOR VISIT: new consult    HISTORICAL  CHIEF COMPLAINT:  Chief Complaint  Patient presents with  . Memory Loss    rm 7 New Pt dgtr- Lawernce Pitts, dgtr- [name]  MMSE 10  . Tremors  . Gait Problem    HISTORY OF PRESENT ILLNESS:   84 year old female here for evaluation of dementia.  Patient has had gradual onset and progressive short-term memory loss, repeating herself, tangential conversations, visual hallucinations decline in ADLs since 2017.  In 2018 patient moved in with her daughter who was able to help her with some of these functions.  Symptoms have been progressive since that time.  Now patient having increasing tremors and gait difficulty.  Patient has had some postural tremor in her right hand for 10 to 20 years.  Now having more resting tremor.  Also has had gait and balance difficulty.  She is able to get around using a single-point cane.  She has had a few falls.   REVIEW OF SYSTEMS: Full 14 system review of systems performed and negative with exception of: As per HPI.  ALLERGIES: Allergies  Allergen Reactions  . Metformin And Related Nausea Only  . Saxagliptin Nausea Only    HOME MEDICATIONS: Outpatient Medications Prior to Visit  Medication Sig Dispense Refill  . ACCU-CHEK SOFTCLIX LANCETS lancets Use to check blood sugars twice daily 200 each 5  . calcium carbonate 200 MG capsule Take 1 capsule by mouth as directed    . Cholecalciferol (VITAMIN D3 PO) Take 1,000 mg by mouth daily.    Marland Kitchen esomeprazole (NEXIUM) 40 MG capsule Take 1 capsule (40 mg total) by mouth daily. 30 capsule 0  . famotidine (PEPCID) 20 MG tablet Take 1 tablet (20 mg total) by mouth 2 (two) times daily as needed for heartburn or indigestion. 15 tablet 0  . glucose blood (ACCU-CHEK AVIVA) test strip Use to check blood sugars twice daily 100 each  12  . Insulin Pen Needle (CAREFINE PEN NEEDLES) 32G X 4 MM MISC Use 4x a day 200 each 2  . LANTUS SOLOSTAR 100 UNIT/ML Solostar Pen INJECT SUBCUTANEOUSLY 30  UNITS ONCE DAILY AT BEDTIME 15 mL 6  . latanoprost (XALATAN) 0.005 % ophthalmic solution Place 1 drop into both eyes every evening.  5  . Omega-3 Fatty Acids (FISH OIL) 1000 MG CAPS Take 1 capsule by mouth daily.    . vitamin B-12 (CYANOCOBALAMIN) 1000 MCG tablet Take 1,000 mcg by mouth daily.    Marland Kitchen amLODipine (NORVASC) 5 MG tablet Take 1 tablet (5 mg total) by mouth daily. (Patient not taking: Reported on 09/06/2019) 30 tablet 0  . aspirin 81 MG tablet Take 81 mg by mouth daily. (Patient not taking: Reported on 09/06/2019)    . ibuprofen (ADVIL) 600 MG tablet Take 1 tablet (600 mg total) by mouth every 6 (six) hours as needed for mild pain or moderate pain. (Patient not taking: Reported on 09/06/2019) 30 tablet 0   No facility-administered medications prior to visit.    PAST MEDICAL HISTORY: Past Medical History:  Diagnosis Date  . Benign essential tremor   . Cataract   . CKD (chronic kidney disease), stage II   . Diabetes mellitus ([place])    type2  . Full dentures   . Hyperlipidemia   . Memory difficulty   . Osteoporosis   . Uncontrolled diabetes mellitus type 2 without complications  03/24/2014  . Vitamin D deficiency   . Wears glasses     PAST SURGICAL HISTORY: Past Surgical History:  Procedure Laterality Date  . APPENDECTOMY     childhood  . CATARACT EXTRACTION, BILATERAL  2014  . COLONOSCOPY     in [place] in 2000  . INCISION AND DRAINAGE ABSCESS N/A 05/18/2013   Procedure: EXCISION OF SEBACEOUS CYST BACK;  Surgeon: Merrie Roof, MD;  Location: [facility];  Service: General;  Laterality: N/A;  . surgery to remove cyst on back  2010   x 2  . TRANSTHORACIC ECHOCARDIOGRAM  06/17/2017    EF 60-65%.  Normal wall motion.  GR 1 DD.  No obvious valvular lesions.  . TUBAL LIGATION      FAMILY HISTORY: Family  History  Problem Relation Age of Onset  . Tuberculosis Mother   . Other Father        homicide  . Colon cancer Neg Hx   . Esophageal cancer Neg Hx   . Rectal cancer Neg Hx   . Stomach cancer Neg Hx     SOCIAL HISTORY: Social History   Socioeconomic History  . Marital status: Widowed    Spouse name: Not on file  . Number of children: 7  . Years of education: Not on file  . Highest education level: 8th grade  Occupational History  . Occupation: retired    Comment: Social research officer, government  Tobacco Use  . Smoking status: Never Smoker  . Smokeless tobacco: Never Used  Substance and Sexual Activity  . Alcohol use: No  . Drug use: No  . Sexual activity: Not on file  Other Topics Concern  . Not on file  Social History Narrative   09/06/19 lives with dgtr, [name]   Very little caffeine   Social Determinants of Health   Financial Resource Strain:   . Difficulty of Paying Living Expenses:   Food Insecurity:   . Worried About Charity fundraiser in the Last Year:   . Arboriculturist in the Last Year:   Transportation Needs:   . Film/video editor (Medical):   Marland Kitchen Lack of Transportation (Non-Medical):   Physical Activity:   . Days of Exercise per Week:   . Minutes of Exercise per Session:   Stress:   . Feeling of Stress :   Social Connections:   . Frequency of Communication with Friends and Family:   . Frequency of Social Gatherings with Friends and Family:   . Attends Religious Services:   . Active Member of Clubs or Organizations:   . Attends Archivist Meetings:   Marland Kitchen Marital Status:   Intimate Partner Violence:   . Fear of Current or Ex-Partner:   . Emotionally Abused:   Marland Kitchen Physically Abused:   . Sexually Abused:      PHYSICAL EXAM  GENERAL EXAM/CONSTITUTIONAL: Vitals:  Vitals:   09/06/19 1541  BP: (!) 139/78  Pulse: 85  Weight: 175 lb (79.4 kg)  Height: 5\' 3"  (1.6 m)     Body mass index is 31 kg/m. Wt Readings from Last 3 Encounters:   09/06/19 175 lb (79.4 kg)  05/18/19 162 lb (73.5 kg)  09/25/17 175 lb (79.4 kg)     Patient is in no distress; well developed, nourished and groomed; neck is supple  CARDIOVASCULAR:  Examination of carotid arteries is normal; no carotid bruits  Regular rate and rhythm, no murmurs  Examination of peripheral vascular system by  observation and palpation is normal  EYES:  Ophthalmoscopic exam of optic discs and posterior segments is normal; no papilledema or hemorrhages  No exam data present  MUSCULOSKELETAL:  Gait, strength, tone, movements noted in Neurologic exam below  NEUROLOGIC: MENTAL STATUS:  MMSE - Mini Mental State Exam 09/06/2019  Orientation to time 2  Orientation to Place 2  Registration 0  Attention/ Calculation 0  Recall 0  Language- name 2 objects 2  Language- repeat 0  Language- follow 3 step command 3  Language- read & follow direction 1  Write a sentence 0  Copy design 0  Total score 10    awake, alert, oriented to person  decr memory   decr attention and concentration  language fluent, comprehension intact, naming intact  fund of knowledge appropriate  TALKATIVE; TANGENTIAL; PLEASANT  CRANIAL NERVE:   2nd - no papilledema on fundoscopic exam  2nd, 3rd, 4th, 6th - pupils equal and reactive to light, visual fields full to confrontation, extraocular muscles intact, no nystagmus  5th - facial sensation symmetric  7th - facial strength symmetric  8th - hearing intact  9th - palate elevates symmetrically, uvula midline  11th - shoulder shrug symmetric  12th - tongue protrusion midline  MOTOR:   INTERMITTENT RESTING TREMOR IN RUE  COGWHEELING RIGIDITY IN LUE  normal bulk and tone, full strength in the BUE, BLE  SENSORY:   normal and symmetric to light touch, temperature, vibration  COORDINATION:   finger-nose-finger, fine finger movements normal  REFLEXES:   deep tendon reflexes TRACE and symmetric  GAIT/STATION:    narrow based gait; SHORT STEPS; USING CANE     DIAGNOSTIC DATA (LABS, IMAGING, TESTING) - I reviewed patient records, labs, notes, testing and imaging myself where available.  Lab Results  Component Value Date   WBC 7.4 05/18/2019   HGB 12.4 05/18/2019   HCT 39.9 05/18/2019   MCV 99.8 05/18/2019   PLT 150 05/18/2019      Component Value Date/Time   NA 139 05/18/2019 1435   K 4.7 05/18/2019 1435   CL 100 05/18/2019 1435   CO2 31 05/18/2019 1435   GLUCOSE 222 (H) 05/18/2019 1435   BUN 25 (H) 05/18/2019 1435   CREATININE 1.01 (H) 05/18/2019 1435   CALCIUM 9.5 05/18/2019 1435   PROT 7.3 05/18/2019 1435   ALBUMIN 3.9 05/18/2019 1435   AST 23 05/18/2019 1435   ALT 15 05/18/2019 1435   ALKPHOS 59 05/18/2019 1435   BILITOT 0.6 05/18/2019 1435   GFRNONAA 52 (L) 05/18/2019 1435   GFRAA >60 05/18/2019 1435   No results found for: CHOL, HDL, LDLCALC, LDLDIRECT, TRIG, CHOLHDL Lab Results  Component Value Date   HGBA1C 9.6 06/26/2017   No results found for: VITAMINB12 No results found for: TSH    ASSESSMENT AND PLAN  84 y.o. year old female here with:  Dx:  1. Memory loss     PLAN:  Memory loss (suspect dementia with lewy bodies; moderate-severe stage) - check MRI brain (rule out other causes) - consider memantine 10mg  at bedtime; increase to twice a day after 1-2 weeks - safety / supervision issues reviewed - caregiver resources provided  Orders Placed This Encounter  Procedures  . MR BRAIN WO CONTRAST   Return for pending if symptoms worsen or fail to improve.    Penni Bombard, MD 07/30/5091, 2:67 PM Certified in Neurology, Neurophysiology and Neuroimaging  [facility] [address] [place], [place] [address] [phone]

## 2019-09-07 ENCOUNTER — Telehealth: Payer: Self-pay | Admitting: Diagnostic Neuroimaging

## 2019-09-07 NOTE — Telephone Encounter (Signed)
[employer] medicare/medicaid order sent to [facility]. No auth they will reach out to the patient to schedule.  °

## 2019-09-09 ENCOUNTER — Other Ambulatory Visit: Payer: Self-pay

## 2019-09-09 ENCOUNTER — Ambulatory Visit
Admission: RE | Admit: 2019-09-09 | Discharge: 2019-09-09 | Disposition: A | Discharge status: Home / Self Care | Payer: Medicare Other | Source: Ambulatory Visit | Attending: Diagnostic Neuroimaging | Admitting: Diagnostic Neuroimaging

## 2019-09-09 DIAGNOSIS — G3183 Dementia with Lewy bodies: Secondary | ICD-10-CM | POA: Diagnosis not present

## 2019-09-13 ENCOUNTER — Telehealth: Payer: Self-pay

## 2019-09-13 NOTE — Telephone Encounter (Signed)
-----   Message from Penni Bombard, MD sent at 09/12/2019  5:31 PM EDT ----- Unremarkable imaging results. Please call patient. Continue current plan. -VRP

## 2019-09-13 NOTE — Telephone Encounter (Signed)
I called pt to discuss. No answer, VM is full. Will try again later.

## 2019-09-13 NOTE — Telephone Encounter (Signed)
Pt's daughter, [name], per DPR, returned my call. I discussed her MRI results and recommendations. Pt's daughter verbalized understanding and had no further questions or concerns.

## 2019-10-04 ENCOUNTER — Other Ambulatory Visit: Payer: Self-pay | Admitting: Internal Medicine

## 2019-12-13 ENCOUNTER — Other Ambulatory Visit: Payer: Self-pay | Admitting: Internal Medicine

## 2019-12-16 ENCOUNTER — Other Ambulatory Visit: Payer: Self-pay

## 2019-12-16 MED ORDER — LANTUS SOLOSTAR 100 UNIT/ML ~~LOC~~ SOPN: PEN_INJECTOR | SUBCUTANEOUS | 0 refills | Status: DC

## 2019-12-21 ENCOUNTER — Other Ambulatory Visit: Payer: Self-pay | Admitting: Family Medicine

## 2019-12-21 DIAGNOSIS — M810 Age-related osteoporosis without current pathological fracture: Secondary | ICD-10-CM

## 2020-02-14 DIAGNOSIS — N189 Chronic kidney disease, unspecified: Secondary | ICD-10-CM | POA: Diagnosis not present

## 2020-02-14 DIAGNOSIS — E1165 Type 2 diabetes mellitus with hyperglycemia: Secondary | ICD-10-CM | POA: Diagnosis not present

## 2020-02-14 DIAGNOSIS — E7800 Pure hypercholesterolemia, unspecified: Secondary | ICD-10-CM | POA: Diagnosis not present

## 2020-03-02 DIAGNOSIS — G301 Alzheimer's disease with late onset: Secondary | ICD-10-CM | POA: Diagnosis not present

## 2020-03-02 DIAGNOSIS — E1121 Type 2 diabetes mellitus with diabetic nephropathy: Secondary | ICD-10-CM | POA: Diagnosis not present

## 2020-03-02 DIAGNOSIS — E785 Hyperlipidemia, unspecified: Secondary | ICD-10-CM | POA: Diagnosis not present

## 2020-03-02 DIAGNOSIS — I129 Hypertensive chronic kidney disease with stage 1 through stage 4 chronic kidney disease, or unspecified chronic kidney disease: Secondary | ICD-10-CM | POA: Diagnosis not present

## 2020-03-02 DIAGNOSIS — E441 Mild protein-calorie malnutrition: Secondary | ICD-10-CM | POA: Diagnosis not present

## 2020-03-27 ENCOUNTER — Other Ambulatory Visit: Payer: Self-pay | Admitting: Internal Medicine

## 2020-03-27 NOTE — Telephone Encounter (Signed)
Last OV 2019

## 2020-04-06 NOTE — Telephone Encounter (Signed)
Patient was lost for follow-up for me.

## 2020-05-14 DIAGNOSIS — G309 Alzheimer's disease, unspecified: Secondary | ICD-10-CM | POA: Diagnosis not present

## 2020-05-14 DIAGNOSIS — E7800 Pure hypercholesterolemia, unspecified: Secondary | ICD-10-CM | POA: Diagnosis not present

## 2020-05-14 DIAGNOSIS — N189 Chronic kidney disease, unspecified: Secondary | ICD-10-CM | POA: Diagnosis not present

## 2020-05-14 DIAGNOSIS — E1165 Type 2 diabetes mellitus with hyperglycemia: Secondary | ICD-10-CM | POA: Diagnosis not present

## 2020-06-22 DIAGNOSIS — Z794 Long term (current) use of insulin: Secondary | ICD-10-CM | POA: Diagnosis not present

## 2020-06-22 DIAGNOSIS — E1165 Type 2 diabetes mellitus with hyperglycemia: Secondary | ICD-10-CM | POA: Diagnosis not present

## 2020-06-27 ENCOUNTER — Other Ambulatory Visit: Payer: Self-pay | Admitting: Internal Medicine

## 2020-08-27 DIAGNOSIS — E1165 Type 2 diabetes mellitus with hyperglycemia: Secondary | ICD-10-CM | POA: Diagnosis not present

## 2020-09-25 DIAGNOSIS — I129 Hypertensive chronic kidney disease with stage 1 through stage 4 chronic kidney disease, or unspecified chronic kidney disease: Secondary | ICD-10-CM | POA: Diagnosis not present

## 2020-09-25 DIAGNOSIS — N182 Chronic kidney disease, stage 2 (mild): Secondary | ICD-10-CM | POA: Diagnosis not present

## 2020-09-25 DIAGNOSIS — E785 Hyperlipidemia, unspecified: Secondary | ICD-10-CM | POA: Diagnosis not present

## 2020-10-29 ENCOUNTER — Other Ambulatory Visit: Payer: Self-pay

## 2020-10-29 ENCOUNTER — Ambulatory Visit
Admission: RE | Admit: 2020-10-29 | Discharge: 2020-10-29 | Disposition: A | Discharge status: Home / Self Care | Payer: Medicare Other | Source: Ambulatory Visit | Attending: Family Medicine | Admitting: Family Medicine

## 2020-10-29 ENCOUNTER — Other Ambulatory Visit: Payer: Self-pay | Admitting: Family Medicine

## 2020-10-29 DIAGNOSIS — I129 Hypertensive chronic kidney disease with stage 1 through stage 4 chronic kidney disease, or unspecified chronic kidney disease: Secondary | ICD-10-CM | POA: Diagnosis not present

## 2020-10-29 DIAGNOSIS — K5900 Constipation, unspecified: Secondary | ICD-10-CM | POA: Diagnosis not present

## 2020-10-29 DIAGNOSIS — N182 Chronic kidney disease, stage 2 (mild): Secondary | ICD-10-CM | POA: Diagnosis not present

## 2020-10-29 DIAGNOSIS — R609 Edema, unspecified: Secondary | ICD-10-CM | POA: Diagnosis not present

## 2020-11-03 DIAGNOSIS — N183 Chronic kidney disease, stage 3 unspecified: Secondary | ICD-10-CM | POA: Diagnosis not present

## 2020-11-03 DIAGNOSIS — K5900 Constipation, unspecified: Secondary | ICD-10-CM | POA: Diagnosis not present

## 2020-11-03 DIAGNOSIS — Z86010 Personal history of colonic polyps: Secondary | ICD-10-CM | POA: Diagnosis not present

## 2020-11-03 DIAGNOSIS — Z9842 Cataract extraction status, left eye: Secondary | ICD-10-CM | POA: Diagnosis not present

## 2020-11-03 DIAGNOSIS — Z794 Long term (current) use of insulin: Secondary | ICD-10-CM | POA: Diagnosis not present

## 2020-11-03 DIAGNOSIS — E1122 Type 2 diabetes mellitus with diabetic chronic kidney disease: Secondary | ICD-10-CM | POA: Diagnosis not present

## 2020-11-03 DIAGNOSIS — G250 Essential tremor: Secondary | ICD-10-CM | POA: Diagnosis not present

## 2020-11-03 DIAGNOSIS — Z9181 History of falling: Secondary | ICD-10-CM | POA: Diagnosis not present

## 2020-11-03 DIAGNOSIS — I129 Hypertensive chronic kidney disease with stage 1 through stage 4 chronic kidney disease, or unspecified chronic kidney disease: Secondary | ICD-10-CM | POA: Diagnosis not present

## 2020-11-03 DIAGNOSIS — E440 Moderate protein-calorie malnutrition: Secondary | ICD-10-CM | POA: Diagnosis not present

## 2020-11-03 DIAGNOSIS — R609 Edema, unspecified: Secondary | ICD-10-CM | POA: Diagnosis not present

## 2020-11-03 DIAGNOSIS — Z9841 Cataract extraction status, right eye: Secondary | ICD-10-CM | POA: Diagnosis not present

## 2020-11-03 DIAGNOSIS — M810 Age-related osteoporosis without current pathological fracture: Secondary | ICD-10-CM | POA: Diagnosis not present

## 2020-11-03 DIAGNOSIS — E7800 Pure hypercholesterolemia, unspecified: Secondary | ICD-10-CM | POA: Diagnosis not present

## 2020-11-03 DIAGNOSIS — Z8619 Personal history of other infectious and parasitic diseases: Secondary | ICD-10-CM | POA: Diagnosis not present

## 2020-11-05 ENCOUNTER — Encounter (HOSPITAL_COMMUNITY): Payer: Self-pay

## 2020-11-05 ENCOUNTER — Emergency Department (HOSPITAL_COMMUNITY): Payer: Medicare Other

## 2020-11-05 ENCOUNTER — Emergency Department (HOSPITAL_COMMUNITY)
Admission: EM | Admit: 2020-11-05 | Discharge: 2020-11-05 | Disposition: A | Discharge status: Home / Self Care | Payer: Medicare Other | Source: Home / Self Care | Attending: Emergency Medicine | Admitting: Emergency Medicine

## 2020-11-05 ENCOUNTER — Other Ambulatory Visit: Payer: Self-pay

## 2020-11-05 DIAGNOSIS — R29704 NIHSS score 4: Secondary | ICD-10-CM | POA: Diagnosis not present

## 2020-11-05 DIAGNOSIS — G319 Degenerative disease of nervous system, unspecified: Secondary | ICD-10-CM | POA: Diagnosis not present

## 2020-11-05 DIAGNOSIS — Z20822 Contact with and (suspected) exposure to covid-19: Secondary | ICD-10-CM | POA: Insufficient documentation

## 2020-11-05 DIAGNOSIS — R29708 NIHSS score 8: Secondary | ICD-10-CM | POA: Diagnosis not present

## 2020-11-05 DIAGNOSIS — R42 Dizziness and giddiness: Secondary | ICD-10-CM | POA: Insufficient documentation

## 2020-11-05 DIAGNOSIS — E538 Deficiency of other specified B group vitamins: Secondary | ICD-10-CM | POA: Diagnosis not present

## 2020-11-05 DIAGNOSIS — Z5321 Procedure and treatment not carried out due to patient leaving prior to being seen by health care provider: Secondary | ICD-10-CM | POA: Insufficient documentation

## 2020-11-05 DIAGNOSIS — E1169 Type 2 diabetes mellitus with other specified complication: Secondary | ICD-10-CM | POA: Diagnosis not present

## 2020-11-05 DIAGNOSIS — R29709 NIHSS score 9: Secondary | ICD-10-CM | POA: Diagnosis not present

## 2020-11-05 DIAGNOSIS — F0282 Dementia in other diseases classified elsewhere, unspecified severity, with psychotic disturbance: Secondary | ICD-10-CM | POA: Diagnosis not present

## 2020-11-05 DIAGNOSIS — R29707 NIHSS score 7: Secondary | ICD-10-CM | POA: Diagnosis not present

## 2020-11-05 DIAGNOSIS — R29706 NIHSS score 6: Secondary | ICD-10-CM | POA: Diagnosis not present

## 2020-11-05 DIAGNOSIS — R29705 NIHSS score 5: Secondary | ICD-10-CM | POA: Diagnosis not present

## 2020-11-05 DIAGNOSIS — F0390 Unspecified dementia without behavioral disturbance: Secondary | ICD-10-CM | POA: Insufficient documentation

## 2020-11-05 DIAGNOSIS — Z23 Encounter for immunization: Secondary | ICD-10-CM | POA: Diagnosis not present

## 2020-11-05 DIAGNOSIS — Z961 Presence of intraocular lens: Secondary | ICD-10-CM | POA: Diagnosis not present

## 2020-11-05 DIAGNOSIS — Z9841 Cataract extraction status, right eye: Secondary | ICD-10-CM | POA: Diagnosis not present

## 2020-11-05 DIAGNOSIS — W19XXXA Unspecified fall, initial encounter: Secondary | ICD-10-CM | POA: Insufficient documentation

## 2020-11-05 DIAGNOSIS — E785 Hyperlipidemia, unspecified: Secondary | ICD-10-CM | POA: Diagnosis not present

## 2020-11-05 DIAGNOSIS — M810 Age-related osteoporosis without current pathological fracture: Secondary | ICD-10-CM | POA: Diagnosis not present

## 2020-11-05 DIAGNOSIS — I63512 Cerebral infarction due to unspecified occlusion or stenosis of left middle cerebral artery: Secondary | ICD-10-CM | POA: Diagnosis not present

## 2020-11-05 DIAGNOSIS — E1165 Type 2 diabetes mellitus with hyperglycemia: Secondary | ICD-10-CM | POA: Diagnosis not present

## 2020-11-05 DIAGNOSIS — I129 Hypertensive chronic kidney disease with stage 1 through stage 4 chronic kidney disease, or unspecified chronic kidney disease: Secondary | ICD-10-CM | POA: Diagnosis not present

## 2020-11-05 DIAGNOSIS — N1831 Chronic kidney disease, stage 3a: Secondary | ICD-10-CM | POA: Diagnosis not present

## 2020-11-05 DIAGNOSIS — E1122 Type 2 diabetes mellitus with diabetic chronic kidney disease: Secondary | ICD-10-CM | POA: Diagnosis not present

## 2020-11-05 DIAGNOSIS — R531 Weakness: Secondary | ICD-10-CM | POA: Diagnosis not present

## 2020-11-05 DIAGNOSIS — K219 Gastro-esophageal reflux disease without esophagitis: Secondary | ICD-10-CM | POA: Diagnosis not present

## 2020-11-05 DIAGNOSIS — I10 Essential (primary) hypertension: Secondary | ICD-10-CM | POA: Diagnosis not present

## 2020-11-05 DIAGNOSIS — E559 Vitamin D deficiency, unspecified: Secondary | ICD-10-CM | POA: Diagnosis not present

## 2020-11-05 DIAGNOSIS — I639 Cerebral infarction, unspecified: Secondary | ICD-10-CM | POA: Diagnosis not present

## 2020-11-05 DIAGNOSIS — G250 Essential tremor: Secondary | ICD-10-CM | POA: Diagnosis not present

## 2020-11-05 LAB — CBC WITH DIFFERENTIAL/PLATELET
Abs Immature Granulocytes: 0.01 10*3/uL (ref 0.00–0.07)
Basophils Absolute: 0 10*3/uL (ref 0.0–0.1)
Basophils Relative: 1 %
Eosinophils Absolute: 0 10*3/uL (ref 0.0–0.5)
Eosinophils Relative: 0 %
HCT: 40.9 % (ref 36.0–46.0)
Hemoglobin: 13.3 g/dL (ref 12.0–15.0)
Immature Granulocytes: 0 %
Lymphocytes Relative: 15 %
Lymphs Abs: 1.1 10*3/uL (ref 0.7–4.0)
MCH: 31.7 pg (ref 26.0–34.0)
MCHC: 32.5 g/dL (ref 30.0–36.0)
MCV: 97.4 fL (ref 80.0–100.0)
Monocytes Absolute: 0.3 10*3/uL (ref 0.1–1.0)
Monocytes Relative: 4 %
Neutro Abs: 5.9 10*3/uL (ref 1.7–7.7)
Neutrophils Relative %: 80 %
Platelets: 159 10*3/uL (ref 150–400)
RBC: 4.2 MIL/uL (ref 3.87–5.11)
RDW: 11.9 % (ref 11.5–15.5)
WBC: 7.3 10*3/uL (ref 4.0–10.5)
nRBC: 0 % (ref 0.0–0.2)

## 2020-11-05 LAB — COMPREHENSIVE METABOLIC PANEL
ALT: 14 U/L (ref 0–44)
AST: 21 U/L (ref 15–41)
Albumin: 3.9 g/dL (ref 3.5–5.0)
Alkaline Phosphatase: 58 U/L (ref 38–126)
Anion gap: 13 (ref 5–15)
BUN: 27 mg/dL — ABNORMAL HIGH (ref 8–23)
CO2: 26 mmol/L (ref 22–32)
Calcium: 9.6 mg/dL (ref 8.9–10.3)
Chloride: 96 mmol/L — ABNORMAL LOW (ref 98–111)
Creatinine, Ser: 0.94 mg/dL (ref 0.44–1.00)
GFR, Estimated: >60 mL/min (ref >60)
Glucose, Bld: 369 mg/dL — ABNORMAL HIGH (ref 70–99)
Potassium: 4.9 mmol/L (ref 3.5–5.1)
Sodium: 135 mmol/L (ref 135–145)
Total Bilirubin: 0.8 mg/dL (ref 0.3–1.2)
Total Protein: 7.3 g/dL (ref 6.5–8.1)

## 2020-11-05 LAB — URINALYSIS, ROUTINE W REFLEX MICROSCOPIC
Bilirubin Urine: NEGATIVE
Glucose, UA: 250 mg/dL — AB
Hgb urine dipstick: NEGATIVE
Ketones, ur: NEGATIVE mg/dL
Leukocytes,Ua: NEGATIVE
Nitrite: NEGATIVE
Protein, ur: NEGATIVE mg/dL
Specific Gravity, Urine: 1.02 (ref 1.005–1.030)
pH: 6.5 (ref 5.0–8.0)

## 2020-11-05 LAB — RESP PANEL BY RT-PCR (FLU A&B, COVID) ARPGX2
Influenza A by PCR: NEGATIVE
Influenza B by PCR: NEGATIVE
SARS Coronavirus 2 by RT PCR: NEGATIVE

## 2020-11-05 NOTE — ED Provider Notes (Signed)
Emergency Medicine Provider Triage Evaluation Note  Stacey Fuentes , a 85 y.o. female  was evaluated in triage.  Pt complains of weakness.  Daughter states patient has been more than normal today, unable to walk without assistance which is not her baseline.  Additionally, her blood pressure has been elevated from baseline in the 784O systolic.  No known fever.  Patient has had issues with her stomach and constipation, although has been having normal bowel movements recently.  No urinary symptoms or cough.  No vomiting.  Patient fell from standing into her chair, did not hit her head.  [place] [address] when walking with daughter but daughter was able to catch her against the wall, did not hit head.  Review of Systems  Positive: weakness Negative: fall  Physical Exam  BP 140/89 (BP Location: Left Arm)   Pulse (!) 109   Temp 97.6 F (36.4 C) (Oral)   Resp 16   Ht 5\' 2"  (1.575 m)   Wt 72.6 kg   SpO2 95%   BMI 29.26 kg/m  Gen:   Awake, no distress   Resp:  Normal effort  MSK:   Moves extremities without difficulty  Other:  Neuro exam difficult due to patient's dementia.  No obvious weakness or decrease in station of upper or lower extremities.  No facial asymmetry.  Medical Decision Making  Medically screening exam initiated at 6:33 PM.  Appropriate orders placed.  Tiney Zipper was informed that the remainder of the evaluation will be completed by another provider, this initial triage assessment does not replace that evaluation, and the importance of remaining in the ED until their evaluation is complete.  Labs, ua, cxr, ct head   Franchot Heidelberg, PA-C 11/05/20 1835    Valarie Merino, MD 11/05/20 2352

## 2020-11-05 NOTE — ED Notes (Signed)
Pt upset due to long wait time.request to speak with charge rn. Charge rn made aware

## 2020-11-05 NOTE — ED Triage Notes (Signed)
Patient's daughter reports that the patient's BP was 165/95 and she checked it 3 times. Patient's daughter reports that the patient fell 3 times today and has been dizzy. Patient's daughter reports poor po intake.  Patient has dementia.

## 2020-11-06 ENCOUNTER — Emergency Department (HOSPITAL_COMMUNITY): Payer: Medicare Other

## 2020-11-06 ENCOUNTER — Encounter (HOSPITAL_COMMUNITY): Payer: Self-pay | Admitting: Emergency Medicine

## 2020-11-06 ENCOUNTER — Inpatient Hospital Stay (HOSPITAL_COMMUNITY)
Admission: EM | Admit: 2020-11-06 | Discharge: 2020-11-14 | DRG: 065 | Disposition: A | Discharge status: Skilled Nursing Facility | Payer: Medicare Other | Attending: Family Medicine | Admitting: Family Medicine

## 2020-11-06 ENCOUNTER — Inpatient Hospital Stay (HOSPITAL_COMMUNITY): Payer: Medicare Other

## 2020-11-06 ENCOUNTER — Other Ambulatory Visit: Payer: Self-pay

## 2020-11-06 DIAGNOSIS — E559 Vitamin D deficiency, unspecified: Secondary | ICD-10-CM | POA: Diagnosis not present

## 2020-11-06 DIAGNOSIS — F0282 Dementia in other diseases classified elsewhere, unspecified severity, with psychotic disturbance: Secondary | ICD-10-CM | POA: Diagnosis present

## 2020-11-06 DIAGNOSIS — Z1159 Encounter for screening for other viral diseases: Secondary | ICD-10-CM | POA: Diagnosis not present

## 2020-11-06 DIAGNOSIS — I63512 Cerebral infarction due to unspecified occlusion or stenosis of left middle cerebral artery: Principal | ICD-10-CM | POA: Diagnosis present

## 2020-11-06 DIAGNOSIS — I129 Hypertensive chronic kidney disease with stage 1 through stage 4 chronic kidney disease, or unspecified chronic kidney disease: Secondary | ICD-10-CM | POA: Diagnosis present

## 2020-11-06 DIAGNOSIS — E7800 Pure hypercholesterolemia, unspecified: Secondary | ICD-10-CM | POA: Diagnosis not present

## 2020-11-06 DIAGNOSIS — R451 Restlessness and agitation: Secondary | ICD-10-CM | POA: Diagnosis not present

## 2020-11-06 DIAGNOSIS — R6889 Other general symptoms and signs: Secondary | ICD-10-CM | POA: Diagnosis not present

## 2020-11-06 DIAGNOSIS — N183 Chronic kidney disease, stage 3 unspecified: Secondary | ICD-10-CM

## 2020-11-06 DIAGNOSIS — I639 Cerebral infarction, unspecified: Secondary | ICD-10-CM | POA: Diagnosis not present

## 2020-11-06 DIAGNOSIS — Z9842 Cataract extraction status, left eye: Secondary | ICD-10-CM

## 2020-11-06 DIAGNOSIS — R5383 Other fatigue: Secondary | ICD-10-CM | POA: Diagnosis not present

## 2020-11-06 DIAGNOSIS — G3183 Dementia with Lewy bodies: Secondary | ICD-10-CM | POA: Diagnosis present

## 2020-11-06 DIAGNOSIS — N182 Chronic kidney disease, stage 2 (mild): Secondary | ICD-10-CM | POA: Diagnosis not present

## 2020-11-06 DIAGNOSIS — I63549 Cerebral infarction due to unspecified occlusion or stenosis of unspecified cerebellar artery: Secondary | ICD-10-CM

## 2020-11-06 DIAGNOSIS — N1831 Chronic kidney disease, stage 3a: Secondary | ICD-10-CM

## 2020-11-06 DIAGNOSIS — R29704 NIHSS score 4: Secondary | ICD-10-CM | POA: Diagnosis not present

## 2020-11-06 DIAGNOSIS — E1121 Type 2 diabetes mellitus with diabetic nephropathy: Secondary | ICD-10-CM | POA: Diagnosis not present

## 2020-11-06 DIAGNOSIS — E1165 Type 2 diabetes mellitus with hyperglycemia: Secondary | ICD-10-CM | POA: Diagnosis not present

## 2020-11-06 DIAGNOSIS — R29706 NIHSS score 6: Secondary | ICD-10-CM | POA: Diagnosis present

## 2020-11-06 DIAGNOSIS — I6621 Occlusion and stenosis of right posterior cerebral artery: Secondary | ICD-10-CM | POA: Diagnosis not present

## 2020-11-06 DIAGNOSIS — R29708 NIHSS score 8: Secondary | ICD-10-CM | POA: Diagnosis not present

## 2020-11-06 DIAGNOSIS — I6389 Other cerebral infarction: Secondary | ICD-10-CM | POA: Diagnosis not present

## 2020-11-06 DIAGNOSIS — Z20822 Contact with and (suspected) exposure to covid-19: Secondary | ICD-10-CM | POA: Diagnosis present

## 2020-11-06 DIAGNOSIS — E1122 Type 2 diabetes mellitus with diabetic chronic kidney disease: Secondary | ICD-10-CM | POA: Diagnosis present

## 2020-11-06 DIAGNOSIS — R29707 NIHSS score 7: Secondary | ICD-10-CM | POA: Diagnosis not present

## 2020-11-06 DIAGNOSIS — Z794 Long term (current) use of insulin: Secondary | ICD-10-CM

## 2020-11-06 DIAGNOSIS — E785 Hyperlipidemia, unspecified: Secondary | ICD-10-CM | POA: Diagnosis not present

## 2020-11-06 DIAGNOSIS — R739 Hyperglycemia, unspecified: Secondary | ICD-10-CM | POA: Diagnosis not present

## 2020-11-06 DIAGNOSIS — Z23 Encounter for immunization: Secondary | ICD-10-CM | POA: Diagnosis not present

## 2020-11-06 DIAGNOSIS — R000 Tachycardia, unspecified: Secondary | ICD-10-CM | POA: Diagnosis present

## 2020-11-06 DIAGNOSIS — I6523 Occlusion and stenosis of bilateral carotid arteries: Secondary | ICD-10-CM | POA: Diagnosis not present

## 2020-11-06 DIAGNOSIS — K219 Gastro-esophageal reflux disease without esophagitis: Secondary | ICD-10-CM | POA: Diagnosis not present

## 2020-11-06 DIAGNOSIS — M6281 Muscle weakness (generalized): Secondary | ICD-10-CM | POA: Diagnosis not present

## 2020-11-06 DIAGNOSIS — R29709 NIHSS score 9: Secondary | ICD-10-CM | POA: Diagnosis not present

## 2020-11-06 DIAGNOSIS — R404 Transient alteration of awareness: Secondary | ICD-10-CM | POA: Diagnosis not present

## 2020-11-06 DIAGNOSIS — E538 Deficiency of other specified B group vitamins: Secondary | ICD-10-CM | POA: Diagnosis present

## 2020-11-06 DIAGNOSIS — G319 Degenerative disease of nervous system, unspecified: Secondary | ICD-10-CM | POA: Diagnosis not present

## 2020-11-06 DIAGNOSIS — M810 Age-related osteoporosis without current pathological fracture: Secondary | ICD-10-CM | POA: Diagnosis present

## 2020-11-06 DIAGNOSIS — R770 Abnormality of albumin: Secondary | ICD-10-CM | POA: Diagnosis not present

## 2020-11-06 DIAGNOSIS — Z961 Presence of intraocular lens: Secondary | ICD-10-CM | POA: Diagnosis present

## 2020-11-06 DIAGNOSIS — E1169 Type 2 diabetes mellitus with other specified complication: Secondary | ICD-10-CM

## 2020-11-06 DIAGNOSIS — F0280 Dementia in other diseases classified elsewhere without behavioral disturbance: Secondary | ICD-10-CM

## 2020-11-06 DIAGNOSIS — I10 Essential (primary) hypertension: Secondary | ICD-10-CM | POA: Diagnosis not present

## 2020-11-06 DIAGNOSIS — Z7401 Bed confinement status: Secondary | ICD-10-CM | POA: Diagnosis not present

## 2020-11-06 DIAGNOSIS — Z79899 Other long term (current) drug therapy: Secondary | ICD-10-CM

## 2020-11-06 DIAGNOSIS — R531 Weakness: Secondary | ICD-10-CM | POA: Diagnosis present

## 2020-11-06 DIAGNOSIS — R4182 Altered mental status, unspecified: Secondary | ICD-10-CM | POA: Diagnosis not present

## 2020-11-06 DIAGNOSIS — F0390 Unspecified dementia without behavioral disturbance: Secondary | ICD-10-CM

## 2020-11-06 DIAGNOSIS — G301 Alzheimer's disease with late onset: Secondary | ICD-10-CM

## 2020-11-06 DIAGNOSIS — I69820 Aphasia following other cerebrovascular disease: Secondary | ICD-10-CM | POA: Diagnosis not present

## 2020-11-06 DIAGNOSIS — Z9181 History of falling: Secondary | ICD-10-CM

## 2020-11-06 DIAGNOSIS — R29705 NIHSS score 5: Secondary | ICD-10-CM | POA: Diagnosis not present

## 2020-11-06 DIAGNOSIS — Z888 Allergy status to other drugs, medicaments and biological substances status: Secondary | ICD-10-CM

## 2020-11-06 DIAGNOSIS — G250 Essential tremor: Secondary | ICD-10-CM | POA: Diagnosis not present

## 2020-11-06 DIAGNOSIS — G459 Transient cerebral ischemic attack, unspecified: Secondary | ICD-10-CM | POA: Diagnosis not present

## 2020-11-06 DIAGNOSIS — R270 Ataxia, unspecified: Secondary | ICD-10-CM | POA: Diagnosis present

## 2020-11-06 DIAGNOSIS — R1111 Vomiting without nausea: Secondary | ICD-10-CM | POA: Diagnosis not present

## 2020-11-06 DIAGNOSIS — Z9841 Cataract extraction status, right eye: Secondary | ICD-10-CM

## 2020-11-06 DIAGNOSIS — I651 Occlusion and stenosis of basilar artery: Secondary | ICD-10-CM | POA: Diagnosis not present

## 2020-11-06 DIAGNOSIS — R0902 Hypoxemia: Secondary | ICD-10-CM | POA: Diagnosis not present

## 2020-11-06 DIAGNOSIS — I69393 Ataxia following cerebral infarction: Secondary | ICD-10-CM | POA: Diagnosis not present

## 2020-11-06 DIAGNOSIS — E46 Unspecified protein-calorie malnutrition: Secondary | ICD-10-CM | POA: Diagnosis not present

## 2020-11-06 DIAGNOSIS — I672 Cerebral atherosclerosis: Secondary | ICD-10-CM | POA: Diagnosis not present

## 2020-11-06 DIAGNOSIS — I951 Orthostatic hypotension: Secondary | ICD-10-CM | POA: Diagnosis not present

## 2020-11-06 DIAGNOSIS — E1140 Type 2 diabetes mellitus with diabetic neuropathy, unspecified: Secondary | ICD-10-CM | POA: Diagnosis not present

## 2020-11-06 DIAGNOSIS — Z743 Need for continuous supervision: Secondary | ICD-10-CM | POA: Diagnosis not present

## 2020-11-06 LAB — CBC WITH DIFFERENTIAL/PLATELET
Abs Immature Granulocytes: 0.06 10*3/uL (ref 0.00–0.07)
Basophils Absolute: 0 10*3/uL (ref 0.0–0.1)
Basophils Relative: 0 %
Eosinophils Absolute: 0 10*3/uL (ref 0.0–0.5)
Eosinophils Relative: 0 %
HCT: 39.7 % (ref 36.0–46.0)
Hemoglobin: 12.8 g/dL (ref 12.0–15.0)
Immature Granulocytes: 1 %
Lymphocytes Relative: 9 %
Lymphs Abs: 1 10*3/uL (ref 0.7–4.0)
MCH: 31.6 pg (ref 26.0–34.0)
MCHC: 32.2 g/dL (ref 30.0–36.0)
MCV: 98 fL (ref 80.0–100.0)
Monocytes Absolute: 0.6 10*3/uL (ref 0.1–1.0)
Monocytes Relative: 6 %
Neutro Abs: 8.9 10*3/uL — ABNORMAL HIGH (ref 1.7–7.7)
Neutrophils Relative %: 84 %
Platelets: 151 10*3/uL (ref 150–400)
RBC: 4.05 MIL/uL (ref 3.87–5.11)
RDW: 11.9 % (ref 11.5–15.5)
WBC: 10.7 10*3/uL — ABNORMAL HIGH (ref 4.0–10.5)
nRBC: 0 % (ref 0.0–0.2)

## 2020-11-06 LAB — COMPREHENSIVE METABOLIC PANEL
ALT: 14 U/L (ref 0–44)
AST: 18 U/L (ref 15–41)
Albumin: 3.3 g/dL — ABNORMAL LOW (ref 3.5–5.0)
Alkaline Phosphatase: 49 U/L (ref 38–126)
Anion gap: 9 (ref 5–15)
BUN: 26 mg/dL — ABNORMAL HIGH (ref 8–23)
CO2: 25 mmol/L (ref 22–32)
Calcium: 9.3 mg/dL (ref 8.9–10.3)
Chloride: 102 mmol/L (ref 98–111)
Creatinine, Ser: 1.06 mg/dL — ABNORMAL HIGH (ref 0.44–1.00)
GFR, Estimated: 52 mL/min — ABNORMAL LOW (ref >60)
Glucose, Bld: 282 mg/dL — ABNORMAL HIGH (ref 70–99)
Potassium: 4.3 mmol/L (ref 3.5–5.1)
Sodium: 136 mmol/L (ref 135–145)
Total Bilirubin: 0.7 mg/dL (ref 0.3–1.2)
Total Protein: 6.5 g/dL (ref 6.5–8.1)

## 2020-11-06 LAB — CBC
HCT: 36.9 % (ref 36.0–46.0)
Hemoglobin: 11.6 g/dL — ABNORMAL LOW (ref 12.0–15.0)
MCH: 31.4 pg (ref 26.0–34.0)
MCHC: 31.4 g/dL (ref 30.0–36.0)
MCV: 99.7 fL (ref 80.0–100.0)
Platelets: 148 10*3/uL — ABNORMAL LOW (ref 150–400)
RBC: 3.7 MIL/uL — ABNORMAL LOW (ref 3.87–5.11)
RDW: 12 % (ref 11.5–15.5)
WBC: 9.1 10*3/uL (ref 4.0–10.5)
nRBC: 0 % (ref 0.0–0.2)

## 2020-11-06 LAB — PROTIME-INR
INR: 1.1 (ref 0.8–1.2)
Prothrombin Time: 13.8 seconds (ref 11.4–15.2)

## 2020-11-06 LAB — APTT: aPTT: 28 seconds (ref 24–36)

## 2020-11-06 LAB — TSH: TSH: 0.478 u[IU]/mL (ref 0.350–4.500)

## 2020-11-06 LAB — VITAMIN B12: Vitamin B-12: 1004 pg/mL — ABNORMAL HIGH (ref 180–914)

## 2020-11-06 LAB — CREATININE, SERUM
Creatinine, Ser: 0.97 mg/dL (ref 0.44–1.00)
GFR, Estimated: 58 mL/min — ABNORMAL LOW (ref >60)

## 2020-11-06 LAB — CBG MONITORING, ED
Glucose-Capillary: 256 mg/dL — ABNORMAL HIGH (ref 70–99)
Glucose-Capillary: 170 mg/dL — ABNORMAL HIGH (ref 70–99)
Glucose-Capillary: 62 mg/dL — ABNORMAL LOW (ref 70–99)
Glucose-Capillary: 130 mg/dL — ABNORMAL HIGH (ref 70–99)
Glucose-Capillary: 113 mg/dL — ABNORMAL HIGH (ref 70–99)

## 2020-11-06 MED ORDER — MELATONIN 5 MG PO TABS
10.0000 mg | ORAL_TABLET | Freq: Every evening | ORAL | Status: DC | PRN
  Administered 2020-11-06 – 2020-11-07 (×2): 10 mg via ORAL
  Filled 2020-11-06 (×3): qty 2

## 2020-11-06 MED ORDER — INSULIN ASPART 100 UNIT/ML IJ SOLN
0.0000 [IU] | Freq: Every day | INTRAMUSCULAR | Status: DC
  Administered 2020-11-09 – 2020-11-11 (×3): 2 [IU] via SUBCUTANEOUS
  Administered 2020-11-13: 3 [IU] via SUBCUTANEOUS

## 2020-11-06 MED ORDER — ATORVASTATIN CALCIUM 40 MG PO TABS
40.0000 mg | ORAL_TABLET | Freq: Every day | ORAL | Status: DC
  Administered 2020-11-06 – 2020-11-13 (×7): 40 mg via ORAL
  Filled 2020-11-06 (×9): qty 1

## 2020-11-06 MED ORDER — ENOXAPARIN SODIUM 40 MG/0.4ML IJ SOSY
40.0000 mg | PREFILLED_SYRINGE | INTRAMUSCULAR | Status: DC
  Administered 2020-11-06 – 2020-11-08 (×3): 40 mg via SUBCUTANEOUS
  Filled 2020-11-06 (×3): qty 0.4

## 2020-11-06 MED ORDER — IOHEXOL 350 MG/ML SOLN
50.0000 mL | Freq: Once | INTRAVENOUS | Status: AC | PRN
  Administered 2020-11-06: 50 mL via INTRAVENOUS

## 2020-11-06 MED ORDER — LATANOPROST 0.005 % OP SOLN
1.0000 [drp] | Freq: Every evening | OPHTHALMIC | Status: DC
  Administered 2020-11-08 – 2020-11-12 (×5): 1 [drp] via OPHTHALMIC
  Filled 2020-11-06: qty 2.5

## 2020-11-06 MED ORDER — SENNOSIDES-DOCUSATE SODIUM 8.6-50 MG PO TABS: 1.0000 | ORAL_TABLET | Freq: Every evening | ORAL | Status: DC | PRN

## 2020-11-06 MED ORDER — ASPIRIN EC 81 MG PO TBEC
81.0000 mg | DELAYED_RELEASE_TABLET | Freq: Every day | ORAL | Status: DC
  Administered 2020-11-07 – 2020-11-13 (×7): 81 mg via ORAL
  Filled 2020-11-06 (×8): qty 1

## 2020-11-06 MED ORDER — VITAMIN D 25 MCG (1000 UNIT) PO TABS
1000.0000 [IU] | ORAL_TABLET | Freq: Every day | ORAL | Status: DC
  Administered 2020-11-06 – 2020-11-11 (×6): 1000 [IU] via ORAL
  Filled 2020-11-06 (×12): qty 1

## 2020-11-06 MED ORDER — SODIUM CHLORIDE 0.9 % IV BOLUS
1000.0000 mL | Freq: Once | INTRAVENOUS | Status: AC
  Administered 2020-11-06: 1000 mL via INTRAVENOUS

## 2020-11-06 MED ORDER — FAMOTIDINE 20 MG PO TABS: 20.0000 mg | ORAL_TABLET | Freq: Two times a day (BID) | ORAL | Status: DC | PRN

## 2020-11-06 MED ORDER — INSULIN GLARGINE-YFGN 100 UNIT/ML ~~LOC~~ SOLN
20.0000 [IU] | Freq: Every day | SUBCUTANEOUS | Status: DC
  Administered 2020-11-06: 20 [IU] via SUBCUTANEOUS
  Filled 2020-11-06 (×2): qty 0.2

## 2020-11-06 MED ORDER — CALCIUM CARBONATE ANTACID 500 MG PO CHEW
1.0000 | CHEWABLE_TABLET | Freq: Two times a day (BID) | ORAL | Status: DC
  Administered 2020-11-06 – 2020-11-13 (×12): 200 mg via ORAL
  Filled 2020-11-06 (×13): qty 1

## 2020-11-06 MED ORDER — STROKE: EARLY STAGES OF RECOVERY BOOK
Freq: Once | Status: DC
  Filled 2020-11-06: qty 1

## 2020-11-06 MED ORDER — ACETAMINOPHEN 160 MG/5ML PO SOLN: 650.0000 mg | ORAL | Status: DC | PRN

## 2020-11-06 MED ORDER — PANTOPRAZOLE SODIUM 40 MG PO TBEC
40.0000 mg | DELAYED_RELEASE_TABLET | Freq: Every day | ORAL | Status: DC
  Administered 2020-11-06 – 2020-11-13 (×7): 40 mg via ORAL
  Filled 2020-11-06 (×9): qty 1

## 2020-11-06 MED ORDER — ASPIRIN 81 MG PO CHEW
324.0000 mg | CHEWABLE_TABLET | Freq: Once | ORAL | Status: AC
  Administered 2020-11-06: 324 mg via ORAL
  Filled 2020-11-06: qty 4

## 2020-11-06 MED ORDER — VITAMIN B-12 1000 MCG PO TABS
1000.0000 ug | ORAL_TABLET | Freq: Every day | ORAL | Status: DC
  Administered 2020-11-06 – 2020-11-13 (×7): 1000 ug via ORAL
  Filled 2020-11-06 (×9): qty 1

## 2020-11-06 MED ORDER — ACETAMINOPHEN 325 MG PO TABS
650.0000 mg | ORAL_TABLET | ORAL | Status: DC | PRN
  Administered 2020-11-07: 650 mg via ORAL
  Filled 2020-11-06: qty 2

## 2020-11-06 MED ORDER — ACETAMINOPHEN 650 MG RE SUPP: 650.0000 mg | RECTAL | Status: DC | PRN

## 2020-11-06 MED ORDER — OMEGA-3-ACID ETHYL ESTERS 1 G PO CAPS
1.0000 g | ORAL_CAPSULE | Freq: Every day | ORAL | Status: DC
  Administered 2020-11-06 – 2020-11-11 (×6): 1 g via ORAL
  Filled 2020-11-06 (×11): qty 1

## 2020-11-06 MED ORDER — CLOPIDOGREL BISULFATE 75 MG PO TABS
75.0000 mg | ORAL_TABLET | Freq: Every day | ORAL | Status: DC
  Administered 2020-11-06 – 2020-11-13 (×8): 75 mg via ORAL
  Filled 2020-11-06 (×9): qty 1

## 2020-11-06 MED ORDER — INSULIN ASPART 100 UNIT/ML IJ SOLN
0.0000 [IU] | Freq: Three times a day (TID) | INTRAMUSCULAR | Status: DC
  Administered 2020-11-06 – 2020-11-08 (×3): 2 [IU] via SUBCUTANEOUS
  Administered 2020-11-08: 1 [IU] via SUBCUTANEOUS
  Administered 2020-11-08: 2 [IU] via SUBCUTANEOUS
  Administered 2020-11-09: 3 [IU] via SUBCUTANEOUS
  Administered 2020-11-09: 2 [IU] via SUBCUTANEOUS
  Administered 2020-11-09: 1 [IU] via SUBCUTANEOUS
  Administered 2020-11-10 (×2): 3 [IU] via SUBCUTANEOUS
  Administered 2020-11-10 – 2020-11-11 (×4): 2 [IU] via SUBCUTANEOUS
  Administered 2020-11-12: 9 [IU] via SUBCUTANEOUS
  Administered 2020-11-12: 2 [IU] via SUBCUTANEOUS
  Administered 2020-11-13: 1 [IU] via SUBCUTANEOUS
  Administered 2020-11-13: 2 [IU] via SUBCUTANEOUS
  Administered 2020-11-13: 1 [IU] via SUBCUTANEOUS

## 2020-11-06 MED ORDER — SODIUM CHLORIDE 0.9 % IV SOLN: INTRAVENOUS | Status: DC

## 2020-11-06 NOTE — Progress Notes (Signed)
SLP note:  Orders received for cognitive/language assessment. Per chart review, pt passed [facility] Swallow screen at 11:18 this am, so diet orders may be written unless she remains NPO for procedures or other medical reasons.  Charvez Voorhies L. Tivis Ringer, [place] Office number [phone] Pager [phone]

## 2020-11-06 NOTE — Consult Note (Addendum)
Neurology Consultation  Reason for Consult: finding of stroke on MRI b. Referring Physician: Jabier Mutton, MD.   CC: dizzy, falls yesterday, AMS, and lethargy.   History is obtained from: chart, daughter at bedside.   HPI: Stacey Fuentes is a 85 y.o. female with a PMHx of dementia (Lewy body suspected), HTN, tremor, CKD II, IDDM II, HLD, osteoporosis, Vit D deficiency, and B12 deficiency. Patient presented yesterday to the ED per family for recent falls, AMS, and increased lethargy. No personal of FMHx of stroke.   Per daughter, she noticed a decline in her mother's ability to care for herself for 5 years or so. She moved patient in with her 4 years ago. Daughter states 5 years ago, patient was completely independent and lived alone. Since 2018, patient has lived with her daughter. Daughter has noticed an acute decline in her mother x 1-2 days. Daughter states before a week ago or so, patient could walk with out assistance, feed herself, initiate conversations, toilet independently, bathe herself after daughter ran the water, and could help dress herself. In past 1-2 days, daughter states patient could not walk and her speech has been slowed with trouble finding words. Her comprehension has been slowed as well. Daughter said she noticed patient sitting and just starring for awhile. During these periods, daughter could not get her attention although patient was awake.   Yesterday, patient fell back in a chair when standing. No injury. On advice of PCP, daughter took patient to UC who told her to go to ED. The wait was over 8 hours, so daughter took patient home. During the night, patient had n/v with inability to walk with same speech problems. Patient fell into the wall and sat down. Daughter was unable to move patient, stating she was "dead weight". Daughter called 32 and patient was brought here.   Work up thus far: Negative CTH, MRI brain with small early subacute infarcts in the cerebellar vermis  and left cerebellar hemisphere in the SCA distribution. She was given ASA 325mg  once in ED. WBCC 10.7, creatinine 1.06, CXR negative, glucose 256, and UA negative.   In review of chart, last out patient neurology note on 2021. Note states that patient has had decreased ability to care for herself since 2017. Patient moved in with daghter in 2018. Chronic essential tremor, now felt to be resting as well. Gait and balance issues mentioned then. MMSE 10.   Due to finding of stroke on MRI brain, neurology was asked to consult.   ROS: Unable to obtain due to altered mental status.   Past Medical History:  Diagnosis Date   Benign essential tremor    Cataract    CKD (chronic kidney disease), stage II    Diabetes mellitus ([place])    type2   Full dentures    Hyperlipidemia    Memory difficulty    Osteoporosis    Uncontrolled diabetes mellitus type 2 without complications 4/65/0354   Vitamin D deficiency    Wears glasses   Dementia, likely Lewy body.   Family History  Problem Relation Age of Onset   Tuberculosis Mother    Other Father        homicide   Colon cancer Neg Hx    Esophageal cancer Neg Hx    Rectal cancer Neg Hx    Stomach cancer Neg Hx    Social History:   reports that she has never smoked. She has never used smokeless tobacco. She reports that she does not drink  alcohol and does not use drugs. Lives with daughter.   Medications  Current Facility-Administered Medications:     stroke: mapping our early stages of recovery book, , Does not apply, Once, Kamineni, Neelima, MD   0.9 %  sodium chloride infusion, , Intravenous, Continuous, Kamineni, Neelima, MD   acetaminophen (TYLENOL) tablet 650 mg, 650 mg, Oral, Q4H PRN **OR** acetaminophen (TYLENOL) 160 MG/5ML solution 650 mg, 650 mg, Per Tube, Q4H PRN **OR** acetaminophen (TYLENOL) suppository 650 mg, 650 mg, Rectal, Q4H PRN, Kamineni, Neelima, MD   aspirin tablet 81 mg, 81 mg, Oral, Daily, Kamineni, Neelima, MD   calcium  carbonate capsule 200 mg, 200 mg, Oral, BID WC, Kamineni, Neelima, MD   cholecalciferol (VITAMIN D) tablet 1,000 Units, 1,000 Units, Oral, Daily, Kamineni, Neelima, MD   enoxaparin (LOVENOX) injection 40 mg, 40 mg, Subcutaneous, Q24H, Kamineni, Neelima, MD   famotidine (PEPCID) tablet 20 mg, 20 mg, Oral, BID PRN, Kamineni, Neelima, MD   insulin aspart (novoLOG) injection 0-5 Units, 0-5 Units, Subcutaneous, QHS, Kamineni, Neelima, MD   insulin aspart (novoLOG) injection 0-9 Units, 0-9 Units, Subcutaneous, TID WC, Kamineni, Neelima, MD   latanoprost (XALATAN) 0.005 % ophthalmic solution 1 drop, 1 drop, Both Eyes, QPM, Kamineni, Neelima, MD   omega-3 acid ethyl esters (LOVAZA) capsule 1 g, 1 g, Oral, Daily, Kamineni, Neelima, MD   pantoprazole (PROTONIX) EC tablet 40 mg, 40 mg, Oral, Daily, Kamineni, Neelima, MD   senna-docusate (Senokot-S) tablet 1 tablet, 1 tablet, Oral, QHS PRN, Earnest Conroy, Neelima, MD   vitamin B-12 (CYANOCOBALAMIN) tablet 1,000 mcg, 1,000 mcg, Oral, Daily, Kamineni, Neelima, MD  Current Outpatient Medications:    ACCU-CHEK SOFTCLIX LANCETS lancets, Use to check blood sugars twice daily, Disp: 200 each, Rfl: 5   amLODipine (NORVASC) 5 MG tablet, Take 1 tablet (5 mg total) by mouth daily. (Patient not taking: Reported on 09/06/2019), Disp: 30 tablet, Rfl: 0   aspirin 81 MG tablet, Take 81 mg by mouth daily. (Patient not taking: Reported on 09/06/2019), Disp: , Rfl:    calcium carbonate 200 MG capsule, Take 1 capsule by mouth as directed, Disp: , Rfl:    Cholecalciferol (VITAMIN D3 PO), Take 1,000 mg by mouth daily., Disp: , Rfl:    esomeprazole (NEXIUM) 40 MG capsule, Take 1 capsule (40 mg total) by mouth daily., Disp: 30 capsule, Rfl: 0   famotidine (PEPCID) 20 MG tablet, Take 1 tablet (20 mg total) by mouth 2 (two) times daily as needed for heartburn or indigestion., Disp: 15 tablet, Rfl: 0   glucose blood (ACCU-CHEK AVIVA) test strip, Use to check blood sugars twice daily, Disp:  100 each, Rfl: 12   ibuprofen (ADVIL) 600 MG tablet, Take 1 tablet (600 mg total) by mouth every 6 (six) hours as needed for mild pain or moderate pain. (Patient not taking: Reported on 09/06/2019), Disp: 30 tablet, Rfl: 0   insulin glargine (LANTUS SOLOSTAR) 100 UNIT/ML Solostar Pen, Inject 30 units once daily at bedtime- please make follow up appointment for further refills., Disp: 15 mL, Rfl: 0   Insulin Pen Needle (CAREFINE PEN NEEDLES) 32G X 4 MM MISC, Use 4x a day, Disp: 200 each, Rfl: 2   latanoprost (XALATAN) 0.005 % ophthalmic solution, Place 1 drop into both eyes every evening., Disp: , Rfl: 5   Omega-3 Fatty Acids (FISH OIL) 1000 MG CAPS, Take 1 capsule by mouth daily., Disp: , Rfl:    vitamin B-12 (CYANOCOBALAMIN) 1000 MCG tablet, Take 1,000 mcg by mouth daily., Disp: , Rfl:  Exam: Current vital signs: BP (!) 155/79 (BP Location: Right Arm)   Pulse 88   Temp 98 F (36.7 C) (Oral)   Resp 16   SpO2 99%  Vital signs in last 24 hours: Temp:  [97.6 F (36.4 C)-98 F (36.7 C)] 98 F (36.7 C) (09/27 0755) Pulse Rate:  [82-109] 88 (09/27 1010) Resp:  [16-21] 16 (09/27 1010) BP: (130-165)/(67-89) 155/79 (09/27 1010) SpO2:  [95 %-99 %] 99 % (09/27 1010) Weight:  [72.6 kg] 72.6 kg (09/26 1818)  PE: GENERAL: Chronically appearing female who is fairly well appearing. Awake, alert in NAD. She is restless, moving covers constantly, and taking her gown off, but can be redirected for a minute or so.  HEENT: normocephalic and atraumatic. LUNGS - Normal respiratory effort.  CV - RRR. ABDOMEN - Soft, nontender. Ext: warm, well perfused. Psych: affect light.   NEURO:  Mental Status: Alert and oriented to name, birthday. Replies "I forgot" to other orientation questions. She follows some commands with redirection and coaching. She is seeing ants and spiders in her bed and comments randomly about noises.   Speech/Language: speech is without dysarthria or aphasia. She named pen as pencil  and stated it was for writing. She named thumb and pinky finger. Unable to name mask. Able to repeat Today is a sunny day, but not no ifs, ands, or buts.   Cranial Nerves:  II: PERRL Visual fields full.  III, IV, VI: EOMI. Lid elevation symmetric and full.  V: sensation is intact and symmetrical to face.  VII: Smile is symmetrical.   VIII:hearing intact to voice. IX, X: palate elevation is symmetric. Phonation normal.  XI: normal sternocleidomastoid and trapezius muscle strength. ALP:FXTKWI is symmetrical without fasciculations.   Motor:  RUE: grips  4/5       LUE: grips  4/5      Unable to participate in other muscle group testing even with coaching and redirecton. Moves all extremities purposefully. Abl to hold arms and legs in air without drift.  Tone is normal. Bulk is normal.  Sensation- Intact to light touch bilaterally in all four extremities.  Coordination: No pronator drift.  Gait- deferred.  NIHSS:  1a Level of Conscious.: 0 1b LOC Questions: 2 1c LOC Commands: 0 2 Best Gaze: 0 3 Visual: 0 4 Facial Palsy: 0 5a Motor Arm - left: 0 5b Motor Arm - Right: 0 6a Motor Leg - Left: 0 6b Motor Leg - Right: 0 7 Limb Ataxia: 0 8 Sensory: 0 9 Best Language: 0 10 Dysarthria: 0 11 Extinct. and Inatten.: 0 TOTAL: 2  MRS 2 prior to admission. Likely, 3 now.   Labs I have reviewed labs in epic and the results pertinent to this consultation are: INR 1.1. aPTT 28. Creat .97. UA negative. CXR negative.   CBC    Component Value Date/Time   WBC 10.7 (H) 11/06/2020 0524   RBC 4.05 11/06/2020 0524   HGB 12.8 11/06/2020 0524   HCT 39.7 11/06/2020 0524   PLT 151 11/06/2020 0524   MCV 98.0 11/06/2020 0524   MCH 31.6 11/06/2020 0524   MCHC 32.2 11/06/2020 0524   RDW 11.9 11/06/2020 0524   LYMPHSABS 1.0 11/06/2020 0524   MONOABS 0.6 11/06/2020 0524   EOSABS 0.0 11/06/2020 0524   BASOSABS 0.0 11/06/2020 0524    CMP     Component Value Date/Time   NA 136 11/06/2020 0524    K 4.3 11/06/2020 0524   CL 102 11/06/2020 0524  CO2 25 11/06/2020 0524   GLUCOSE 282 (H) 11/06/2020 0524   BUN 26 (H) 11/06/2020 0524   CREATININE 1.06 (H) 11/06/2020 0524   CALCIUM 9.3 11/06/2020 0524   PROT 6.5 11/06/2020 0524   ALBUMIN 3.3 (L) 11/06/2020 0524   AST 18 11/06/2020 0524   ALT 14 11/06/2020 0524   ALKPHOS 49 11/06/2020 0524   BILITOT 0.7 11/06/2020 0524   GFRNONAA 52 (L) 11/06/2020 0524   GFRAA >60 05/18/2019 1435   Imaging MD reviewed the images obtained.  CT head -No acute intracranial findings. -Chronic atrophy and periventricular white matter microvascular disease.   CT head and neck  Ordered.   MRI brain -Small early subacute infarcts in the cerebellar vermis and left cerebellar hemisphere in the SCA distribution. -Global parenchymal volume loss with disproportionate severe right hippocampal atrophy. -Advanced chronic white matter microangiopathy.  Assessment: 85 yo female with stroke risk factors of advanced age, HLD, HTN, and IDDM II. Out of window for intervention when presented to ED. Cerebellar infarct could explain her gait disturbance. She shows evidence of delirium today with visual hallucinations, but this is not new. Her dementia is advanced as evidenced by last MMSE 10.   Impression: -early subacute infarcts in cerebellar vermis and left cerebellar hemisphere in the SCA distribution.  -Dementia, severe with MMSE 10, likely Lewy Body.  -Delirium in face of dementia.   Recommendations/Plan:  -Medicine admit.  -CTA head and neck.  -HgbA1c, goal is less than 7%.  -Fasting lipid panel.  -Consider starting high intensity statin if LDL over 70.  - Frequent neuro checks. - follow NIHSS. - Echocardiogram. - ASA 81mg  po qd and ongoing. Had ASA 325mg  po once today.  -If CTA head and neck + significant atherosclerosis/stenosis, will need daily ASA 325mg .  -Plavix 75mg  po qd x 21 days.  -If CTA reveals significant atherosclerosis/stenosis, give  Plavix . 90 days.  - Risk factor modification. - cardiac telemetry monitoring for arrhythmia. - PT consult, OT consult, Speech consult. -Delirium precautions. Avoid medications on the Beer's list for the elderly.  - stroke education. -Outpatient referral placed to her known neurologist for 4 weeks after discharge home.  - Stroke team to follow in a.m.   Addendum: Given staring episodes, will check EEG. Discussed with Dr. Leonel Ramsay.   NP discussed with daughter Plavix/ASA. She did not think it would be difficult to give the patient these medications.   Pt seen by Clance Boll, NP/Neuro and later by MD. Note/plan to be edited by MD as needed.  Pager: [phone]

## 2020-11-06 NOTE — ED Notes (Signed)
Daughter given an update

## 2020-11-06 NOTE — ED Notes (Signed)
Daughter Lawernce Pitts [phone] would like an update

## 2020-11-06 NOTE — ED Notes (Signed)
Wheeled patient to the bathroom patient did well 

## 2020-11-06 NOTE — ED Provider Notes (Signed)
[facility] EMERGENCY DEPARTMENT Provider Note   CSN: [id number] Arrival date & time: 11/06/20  0449  LEVEL 5 CAVEAT - DEMENTIA   History Chief Complaint  Patient presents with   ALOC / Hyperglycemia    Stacey Fuentes is a 85 y.o. female.  HPI 85 year old female presents with altered mental status and inability to walk.  History is from the daughter who lives with her.  The patient has dementia and has had a gradual decline for a few weeks.  However all of a sudden yesterday she was unable to walk when normally she can ambulate to and from the bathroom without difficulty.  She uses no assist devices.  No known fevers or cough.  She did vomit once this morning with breakfast.  The patient seemed to have tried to get out of bed but could not get out of bed this morning and was laying halfway off the bed.  Her speech was very off yesterday though it seems to be better now.  Past Medical History:  Diagnosis Date   Benign essential tremor    Cataract    CKD (chronic kidney disease), stage II    Diabetes mellitus ([place])    type2   Full dentures    Hyperlipidemia    Memory difficulty    Osteoporosis    Uncontrolled diabetes mellitus type 2 without complications 5/78/4696   Vitamin D deficiency    Wears glasses     Patient Active Problem List   Diagnosis Date Noted   CVA (cerebral vascular accident) ([place]) 11/06/2020   Essential hypertension 07/12/2017   Orthostatic syncope 06/11/2017   Aortic ejection murmur 06/11/2017   Uncontrolled type 2 diabetes mellitus with nephropathy ([place]) 03/24/2014   Upper abdominal pain 03/23/2014   Nausea 03/23/2014   Abnormal findings on radiological examination of gastrointestinal tract 03/23/2014   Infected sebaceous cyst 04/15/2013    Past Surgical History:  Procedure Laterality Date   APPENDECTOMY     childhood   CATARACT EXTRACTION, BILATERAL  2014   COLONOSCOPY     in [place] in [place]  N/A 05/18/2013   Procedure: EXCISION OF SEBACEOUS CYST BACK;  Surgeon: Merrie Roof, MD;  Location: [facility];  Service: General;  Laterality: N/A;   surgery to remove cyst on back  2010   x 2   TRANSTHORACIC ECHOCARDIOGRAM  06/17/2017    EF 60-65%.  Normal wall motion.  GR 1 DD.  No obvious valvular lesions.   TUBAL LIGATION       OB History   No obstetric history on file.     Family History  Problem Relation Age of Onset   Tuberculosis Mother    Other Father        homicide   Colon cancer Neg Hx    Esophageal cancer Neg Hx    Rectal cancer Neg Hx    Stomach cancer Neg Hx     Social History   Tobacco Use   Smoking status: Never   Smokeless tobacco: Never  Vaping Use   Vaping Use: Never used  Substance Use Topics   Alcohol use: No   Drug use: No    Home Medications Prior to Admission medications   Medication Sig Start Date End Date Taking? Authorizing Provider  calcium carbonate 200 MG capsule Take 1 capsule by mouth as directed   Yes [provider]  Cholecalciferol (VITAMIN D3 PO) Take 1,000 mg by mouth daily.  Yes [provider]  esomeprazole (NEXIUM) 40 MG capsule Take 1 capsule (40 mg total) by mouth daily. 09/25/17  Yes Drenda Freeze, MD  famotidine (PEPCID) 20 MG tablet Take 1 tablet (20 mg total) by mouth 2 (two) times daily as needed for heartburn or indigestion. 09/25/17  Yes Drenda Freeze, MD  insulin glargine (LANTUS SOLOSTAR) 100 UNIT/ML Solostar Pen Inject 30 units once daily at bedtime- please make follow up appointment for further refills. 12/16/19  Yes Philemon Kingdom, MD  latanoprost (XALATAN) 0.005 % ophthalmic solution Place 1 drop into both eyes every evening. 07/11/17  Yes [provider]  Melatonin 10 MG TABS Take 1 tablet by mouth at bedtime.   Yes [provider]  Multiple Vitamin (MULTIVITAMIN WITH MINERALS) TABS tablet Take 1 tablet by mouth daily.   Yes [provider]   Omega-3 Fatty Acids (FISH OIL) 1000 MG CAPS Take 1 capsule by mouth daily.   Yes [provider]  vitamin B-12 (CYANOCOBALAMIN) 1000 MCG tablet Take 1,000 mcg by mouth daily.   Yes [provider]  ACCU-CHEK SOFTCLIX LANCETS lancets Use to check blood sugars twice daily 04/28/17   Philemon Kingdom, MD  amLODipine (NORVASC) 5 MG tablet Take 1 tablet (5 mg total) by mouth daily. Patient not taking: Reported on 09/06/2019 05/18/19 09/06/19  Wyvonnia Dusky, MD  glucose blood (ACCU-CHEK AVIVA) test strip Use to check blood sugars twice daily 04/28/17   Philemon Kingdom, MD  ibuprofen (ADVIL) 600 MG tablet Take 1 tablet (600 mg total) by mouth every 6 (six) hours as needed for mild pain or moderate pain. Patient not taking: No sig reported 05/18/19   Wyvonnia Dusky, MD  Insulin Pen Needle (CAREFINE PEN NEEDLES) 32G X 4 MM MISC Use 4x a day 03/24/14   Philemon Kingdom, MD    Allergies    Metformin and related and Saxagliptin  Review of Systems   Review of Systems  Unable to perform ROS: Dementia   Physical Exam Updated Vital Signs BP (!) 155/79 (BP Location: Right Arm)   Pulse 88   Temp 98 F (36.7 C) (Oral)   Resp 16   SpO2 99%   Physical Exam Vitals and nursing note reviewed.  Constitutional:      General: She is not in acute distress.    Appearance: She is well-developed. She is not ill-appearing or diaphoretic.  HENT:     Head: Normocephalic and atraumatic.     Right Ear: External ear normal.     Left Ear: External ear normal.     Nose: Nose normal.  Eyes:     General:        Right eye: No discharge.        Left eye: No discharge.  Cardiovascular:     Rate and Rhythm: Normal rate and regular rhythm.     Heart sounds: Normal heart sounds.  Pulmonary:     Effort: Pulmonary effort is normal.     Breath sounds: Normal breath sounds.  Abdominal:     Palpations: Abdomen is soft.     Tenderness: There is no abdominal tenderness.  Skin:    General: Skin is  warm and dry.  Neurological:     Mental Status: She is alert. She is disoriented.     Comments: Awake, alert, disoriented. 5/5 strength in BUE. Difficult to get her to move her lower extremities, whether they are weak or she isn't following commands is unclear.  She does move  the right lower extremity a little more than the left but both she minimally moves to commands.  Psychiatric:        Mood and Affect: Mood is not anxious.    ED Results / Procedures / Treatments   Labs (all labs ordered are listed, but only abnormal results are displayed) Labs Reviewed  CBC WITH DIFFERENTIAL/PLATELET - Abnormal; Notable for the following components:      Result Value   WBC 10.7 (*)    Neutro Abs 8.9 (*)    All other components within normal limits  COMPREHENSIVE METABOLIC PANEL - Abnormal; Notable for the following components:   Glucose, Bld 282 (*)    BUN 26 (*)    Creatinine, Ser 1.06 (*)    Albumin 3.3 (*)    GFR, Estimated 52 (*)    All other components within normal limits  CBC - Abnormal; Notable for the following components:   RBC 3.70 (*)    Hemoglobin 11.6 (*)    Platelets 148 (*)    All other components within normal limits  CBG MONITORING, ED - Abnormal; Notable for the following components:   Glucose-Capillary 256 (*)    All other components within normal limits  CBG MONITORING, ED - Abnormal; Notable for the following components:   Glucose-Capillary 170 (*)    All other components within normal limits  SARS CORONAVIRUS 2 (TAT 6-24 HRS)  URINALYSIS, ROUTINE W REFLEX MICROSCOPIC  CREATININE, SERUM  VITAMIN B12  TSH  PROTIME-INR  APTT    EKG EKG Interpretation  Date/Time:  Tuesday November 06 2020 05:00:05 EDT Ventricular Rate:  95 PR Interval:  150 QRS Duration: 76 QT Interval:  346 QTC Calculation: 434 R Axis:   46 Text Interpretation: Normal sinus rhythm no acute ST/T changes similar to yesterday Confirmed by Sherwood Gambler [phone]) on 11/06/2020 7:00:36  AM  Radiology CT ANGIO HEAD NECK W WO CM  Result Date: 11/06/2020 CLINICAL DATA:  Ataxia, stroke suspected EXAM: CT ANGIOGRAPHY HEAD AND NECK TECHNIQUE: Multidetector CT imaging of the head and neck was performed using the standard protocol during bolus administration of intravenous contrast. Multiplanar CT image reconstructions and MIPs were obtained to evaluate the vascular anatomy. Carotid stenosis measurements (when applicable) are obtained utilizing NASCET criteria, using the distal internal carotid diameter as the denominator. CONTRAST:  68mL OMNIPAQUE IOHEXOL 350 MG/ML SOLN COMPARISON:  CT Head 11/05/2020.  Same day MRI. FINDINGS: CT HEAD FINDINGS Brain: Known acute infarcts in the cerebellar vermis and left cerebellar hemisphere are not well characterized by CT. No acute hemorrhage or mass effect. Redemonstrated moderate to advanced chronic microvascular ischemic disease and atrophy with disproportionate right hippocampal volume loss. No hydrocephalus, midline shift, mass lesion, or extra-axial fluid collection. Vascular: See below. Skull: No acute fracture. Sinuses: Visualized sinuses are clear. Orbits: No acute findings. Review of the MIP images confirms the above findings CTA NECK FINDINGS Aortic arch: Great vessel origins are patent.  Atherosclerosis. Right carotid system: Atherosclerosis of the common carotid artery with approximately 40% stenosis distally. Mixed calcific and noncalcific atherosclerosis at the carotid bifurcation without greater than 50% stenosis relative to the distal vessel. Retropharyngeal course. Left carotid system: Atherosclerosis at the carotid bifurcation without greater than 50% stenosis relative to the distal vessel. Vertebral arteries: Right dominant. The left vertebral artery is small throughout its course. No evidence of significant (greater than 50%) stenosis. Skeleton: Moderate multilevel degenerative disc disease. Other neck: No acute abnormality. Upper chest: Mild  dependent ground-glass opacities, likely atelectasis. Otherwise,  clear visualized lung apices. Review of the MIP images confirms the above findings CTA HEAD FINDINGS Anterior circulation: Bilateral intracranial ICA calcific atherosclerosis with severe greater than left paraclinoid ICA stenosis. Bilateral M1 MCAs are small but patent. No proximal M2 MCA occlusion. Small or absent right A1 ACA, likely congenital. Otherwise, patent ACAs. Posterior circulation: Small/non dominant left vertebral artery appears to largely terminate as PICA. Mild atherosclerotic narrowing of the dominant right intradural vertebral artery. The basilar artery is small with superimposed moderate atherosclerotic narrowing along its midportion. Both superior cerebellar arteries are small with nonvisualization of the mid and distal left superior cerebellar artery, likely stenotic or occluded. Fetal type PCA on the right with severe right P2 PCA stenosis. Venous sinuses: As permitted by contrast timing, patent. Anatomic variants: As detailed above Review of the MIP images confirms the above findings IMPRESSION: CT head: 1. Known acute infarcts in the cerebellar vermis and left cerebellar hemisphere are not well characterized by CT. No acute hemorrhage or mass effect. 2. Redemonstrated moderate to advanced chronic microvascular ischemic disease and atrophy with disproportionate right hippocampal volume loss. CTA head: 1. Severe right greater than left paraclinoid ICA stenosis. 2. The superior cerebellar arteries are difficult to assess due to their small size with nonvisualization of the mid and distal left superior cerebellar artery, likely stenotic or occluded. 3. Small vertebrobasilar system with superimposed moderate narrowing of the mid basilar artery and severe right P2 PCA stenosis. CTA neck: 1. Approximately 40% stenosis of the distal right common carotid artery. 2. Bilateral carotid bifurcation atherosclerosis without greater than 50%  stenosis. Electronically Signed   By: Margaretha Sheffield M.D.   On: 11/06/2020 13:27   DG Chest 2 View  Result Date: 11/05/2020 CLINICAL DATA:  Weakness, fall EXAM: CHEST - 2 VIEW COMPARISON:  09/25/2017 FINDINGS: Lungs are clear.  No pleural effusion or pneumothorax. The heart is normal in size. Mild degenerative changes of the visualized thoracolumbar spine. IMPRESSION: Normal chest radiographs. Electronically Signed   By: Julian Hy M.D.   On: 11/05/2020 19:32   CT HEAD WO CONTRAST (5MM)  Result Date: 11/05/2020 CLINICAL DATA:  Mental status change. EXAM: CT HEAD WITHOUT CONTRAST TECHNIQUE: Contiguous axial images were obtained from the base of the skull through the vertex without intravenous contrast. COMPARISON:  None. FINDINGS: Brain: No acute intracranial hemorrhage. No focal mass lesion. No CT evidence of acute infarction. No midline shift or mass effect. No hydrocephalus. Basilar cisterns are patent. There are periventricular and subcortical white matter hypodensities. Generalized cortical atrophy. Vascular: No hyperdense vessel or unexpected calcification. Skull: Normal. Negative for fracture or focal lesion. Sinuses/Orbits: Paranasal sinuses and mastoid air cells are clear. Orbits are clear. Other: None. IMPRESSION: 1. No acute intracranial findings. 2. Chronic atrophy and periventricular white matter microvascular disease. Electronically Signed   By: Suzy Bouchard M.D.   On: 11/05/2020 19:33   MR BRAIN WO CONTRAST  Result Date: 11/06/2020 CLINICAL DATA:  Increased lethargy, reported fall EXAM: MRI HEAD WITHOUT CONTRAST TECHNIQUE: Multiplanar, multiecho pulse sequences of the brain and surrounding structures were obtained without intravenous contrast. COMPARISON:  Noncontrast CT head obtained 1 day prior FINDINGS: Brain: There are small acute to early subacute infarcts in the cerebellar vermis and left cerebellar hemisphere in the SCA distribution. There is no associated hemorrhage.  There is no other acute infarct. There is no extra-axial fluid collection. There is moderate global parenchymal volume loss with disproportionate severe right hippocampal atrophy. There is moderate left hippocampal atrophy. There is commensurate  ex vacuo dilatation of the ventricular system. There is extensive confluent FLAIR signal abnormality throughout the subcortical and periventricular white matter likely reflecting sequela of advanced chronic white matter microangiopathy. There is no mass lesion.  There is no midline shift. Vascular: Normal flow voids. Skull and upper cervical spine: Normal marrow signal. Sinuses/Orbits: The imaged paranasal sinuses are clear. Bilateral lens implants are in place. The globes and orbits are otherwise unremarkable. Other: None. IMPRESSION: 1. Small early subacute infarcts in the cerebellar vermis and left cerebellar hemisphere in the SCA distribution. 2. Global parenchymal volume loss with disproportionate severe right hippocampal atrophy. 3. Advanced chronic white matter microangiopathy. Electronically Signed   By: Valetta Mole M.D.   On: 11/06/2020 09:34    Procedures Procedures   Medications Ordered in ED Medications  aspirin tablet 81 mg (has no administration in time range)  pantoprazole (PROTONIX) EC tablet 40 mg (has no administration in time range)  famotidine (PEPCID) tablet 20 mg (has no administration in time range)  vitamin B-12 (CYANOCOBALAMIN) tablet 1,000 mcg (has no administration in time range)  calcium carbonate capsule 200 mg (has no administration in time range)  cholecalciferol (VITAMIN D) tablet 1,000 Units (has no administration in time range)  omega-3 acid ethyl esters (LOVAZA) capsule 1 g (has no administration in time range)  latanoprost (XALATAN) 0.005 % ophthalmic solution 1 drop (has no administration in time range)   stroke: mapping our early stages of recovery book (has no administration in time range)  0.9 %  sodium chloride  infusion ( Intravenous New Bag/Given 11/06/20 1234)  acetaminophen (TYLENOL) tablet 650 mg (has no administration in time range)    Or  acetaminophen (TYLENOL) 160 MG/5ML solution 650 mg (has no administration in time range)    Or  acetaminophen (TYLENOL) suppository 650 mg (has no administration in time range)  senna-docusate (Senokot-S) tablet 1 tablet (has no administration in time range)  enoxaparin (LOVENOX) injection 40 mg (40 mg Subcutaneous Given 11/06/20 1242)  insulin aspart (novoLOG) injection 0-9 Units (2 Units Subcutaneous Given 11/06/20 1243)  insulin aspart (novoLOG) injection 0-5 Units (has no administration in time range)  insulin glargine-yfgn (SEMGLEE) injection 20 Units (has no administration in time range)  sodium chloride 0.9 % bolus 1,000 mL (0 mLs Intravenous Stopped 11/06/20 0900)  aspirin chewable tablet 324 mg (324 mg Oral Given 11/06/20 1120)  iohexol (OMNIPAQUE) 350 MG/ML injection 50 mL (50 mLs Intravenous Contrast Given 11/06/20 1257)    ED Course  I have reviewed the triage vital signs and the nursing notes.  Pertinent labs & imaging results that were available during my care of the patient were reviewed by me and considered in my medical decision making (see chart for details).    MDM Rules/Calculators/A&P                           Patient's MRI does reveal subacute/acute infarcts.  This goes along with her difficulty walking.  I have personally reviewed these images.  We will give aspirin and I have consulted neurology as well as hospitalist service for admission. Final Clinical Impression(s) / ED Diagnoses Final diagnoses:  Cerebellar infarct [facility])    Rx / DC Orders ED Discharge Orders     None        Sherwood Gambler, MD 11/06/20 1357

## 2020-11-06 NOTE — H&P (Addendum)
History and Physical    DOA: 11/06/2020  PCP: Harlan Stains, MD  Patient coming from: HOME  Chief Complaint: ataxia, weakness  HPI: Stacey Fuentes is a 85 y.o. female with history h/o dementia, GERD, hypertension, hyperlipidemia, CKD stage II, diabetes mellitus type 2, osteoporosis, benign essential tremors, now brought in by daughter due to worsening weakness and ambulatory function.  Daughter states that at baseline her mother has dementia with mild to moderate cognitive dysfunction where at baseline-she recognizes family members sometimes, cannot keep up with dates or tell who the president is and usually ambulates using a cane, able to feed herself and participate in conversation to some extent.  Daughter also reports occasional hallucinations at baseline but recently was evaluated by primary care provided for worsening confusion with hallucinations, was apparently prescribed 2 medications (?  Antipsychotics, daughter does not know the names) which she did not tolerate due to drowsiness and worsening hallucinations.  Subsequently these medications were discontinued by daughter.  Over the last few days daughter noticed that patient having difficulty ambulating mostly wobbly with balance issues.  She felt patient was weak in both her lower extremities than her baseline as she could not hold herself up and felt backwards into the chair yesterday.  Daughter called PCP who advised her to take patient to urgent care->subsequently sent to [facility] ED where she was seen in the [place]/waiting area and had preliminary work-up including CT head which was unremarkable.  Daughter got frustrated about long wait time for MD evaluation and left.  Patient persisted to have difficulty ambulating overnight and daughter brought in to [facility] ED today.  According to the daughter, patient was having hypotensive episodes early in the mornings and was discontinued on Norvasc.  Patient also not been taking aspirin due to  dyspepsia/confusion for few weeks.  Although daughter reports giving 2 doses of baby aspirin yesterday and concern for possible stroke. ED course: Afebrile, BP 133/87-165/84, pulse 80s, respiratory rate 16, O2 sat 99% on room air.  Patient pleasantly confused, labs showed WBC 9.1, hemoglobin 11.6, sodium 136, K4.3, BUN 26, creatinine 1.0, BG 282, calcium 9.3.  CT head from 9/26:Chronic atrophy and periventricular white matter microvascular disease.  MRI of the head showed:Small early subacute infarcts in the cerebellar vermis and left cerebellar hemisphere in the SCA distribution.Global parenchymal volume loss with disproportionate severe right hippocampal atrophy. Patient requested to be admitted for further evaluation and management of acute cerebellar CVA.  Neurology has been consulted.  Review of Systems: As per HPI, otherwise review of systems negative.    Past Medical History:  Diagnosis Date   Benign essential tremor    Cataract    CKD (chronic kidney disease), stage II    Diabetes mellitus ([place])    type2   Full dentures    Hyperlipidemia    Memory difficulty    Osteoporosis    Uncontrolled diabetes mellitus type 2 without complications 7/82/4235   Vitamin D deficiency    Wears glasses     Past Surgical History:  Procedure Laterality Date   APPENDECTOMY     childhood   CATARACT EXTRACTION, BILATERAL  2014   COLONOSCOPY     in [place] in [place] N/A 05/18/2013   Procedure: EXCISION OF SEBACEOUS CYST BACK;  Surgeon: Merrie Roof, MD;  Location: [facility];  Service: General;  Laterality: N/A;   surgery to remove cyst on back  2010   x 2  TRANSTHORACIC ECHOCARDIOGRAM  06/17/2017    EF 60-65%.  Normal wall motion.  GR 1 DD.  No obvious valvular lesions.   TUBAL LIGATION      Social history:  reports that she has never smoked. She has never used smokeless tobacco. She reports that she does not drink alcohol and does not use  drugs.   Allergies  Allergen Reactions   Metformin And Related Nausea Only   Saxagliptin Nausea Only    Family History  Problem Relation Age of Onset   Tuberculosis Mother    Other Father        homicide   Colon cancer Neg Hx    Esophageal cancer Neg Hx    Rectal cancer Neg Hx    Stomach cancer Neg Hx       Prior to Admission medications   Medication Sig Start Date End Date Taking? Authorizing Provider  calcium carbonate 200 MG capsule Take 1 capsule by mouth as directed   Yes [provider]  Cholecalciferol (VITAMIN D3 PO) Take 1,000 mg by mouth daily.   Yes [provider]  esomeprazole (NEXIUM) 40 MG capsule Take 1 capsule (40 mg total) by mouth daily. 09/25/17  Yes Drenda Freeze, MD  famotidine (PEPCID) 20 MG tablet Take 1 tablet (20 mg total) by mouth 2 (two) times daily as needed for heartburn or indigestion. 09/25/17  Yes Drenda Freeze, MD  insulin glargine (LANTUS SOLOSTAR) 100 UNIT/ML Solostar Pen Inject 30 units once daily at bedtime- please make follow up appointment for further refills. 12/16/19  Yes Philemon Kingdom, MD  latanoprost (XALATAN) 0.005 % ophthalmic solution Place 1 drop into both eyes every evening. 07/11/17  Yes [provider]  Melatonin 10 MG TABS Take 1 tablet by mouth at bedtime.   Yes [provider]  Multiple Vitamin (MULTIVITAMIN WITH MINERALS) TABS tablet Take 1 tablet by mouth daily.   Yes [provider]  Omega-3 Fatty Acids (FISH OIL) 1000 MG CAPS Take 1 capsule by mouth daily.   Yes [provider]  vitamin B-12 (CYANOCOBALAMIN) 1000 MCG tablet Take 1,000 mcg by mouth daily.   Yes [provider]  ACCU-CHEK SOFTCLIX LANCETS lancets Use to check blood sugars twice daily 04/28/17   Philemon Kingdom, MD  amLODipine (NORVASC) 5 MG tablet Take 1 tablet (5 mg total) by mouth daily. Patient not taking: Reported on 09/06/2019 05/18/19 09/06/19  Wyvonnia Dusky, MD  glucose blood  (ACCU-CHEK AVIVA) test strip Use to check blood sugars twice daily 04/28/17   Philemon Kingdom, MD  ibuprofen (ADVIL) 600 MG tablet Take 1 tablet (600 mg total) by mouth every 6 (six) hours as needed for mild pain or moderate pain. Patient not taking: No sig reported 05/18/19   Wyvonnia Dusky, MD  Insulin Pen Needle (CAREFINE PEN NEEDLES) 32G X 4 MM MISC Use 4x a day 03/24/14   Philemon Kingdom, MD    Physical Exam: Vitals:   11/06/20 0456 11/06/20 0755 11/06/20 1010 11/06/20 1458  BP: (!) 165/84 130/67 (!) 155/79 (!) 150/90  Pulse: 95 82 88 93  Resp: 18 18 16 16   Temp:  98 F (36.7 C)    TempSrc:  Oral    SpO2: 96% 96% 99% 98%    Constitutional: In hallway stretcher, pleasantly confused, calm, comfortable Eyes: PERRL, lids and conjunctivae normal ENMT: Mucous membranes are moist. Posterior pharynx clear of any exudate or lesions.Normal dentition.  Neck: normal, supple, no masses, no thyromegaly Respiratory: clear to  auscultation bilaterally, no wheezing, no crackles. Normal respiratory effort. No accessory muscle use.  Cardiovascular: Regular rate and rhythm, no murmurs / rubs / gallops. No extremity edema. 2+ pedal pulses. No carotid bruits.  Abdomen: no tenderness, no masses palpated. No hepatosplenomegaly. Bowel sounds positive.  Musculoskeletal: no clubbing / cyanosis. No joint deformity upper and lower extremities. Good ROM, no contractures. Normal muscle tone.  Neurologic: Pleasantly confused, oriented to person only, no facial droop, unable to follow commands for ideal neurological exam but appears to have good handgrip and strength/muscle tone in upper/lower extremities.  She attempted finger-to-nose test and was noted to be past-pointing on the right side but could not get her to comply with doing the same on left side.  No pronator drift but noted to have mild essential tremors.  Could not test further. Psychiatric: Impaired judgment and insight due to baseline dementia alert  and oriented x 1. Normal mood.  SKIN/catheters: no rashes, lesions, ulcers. No induration  Labs on Admission: I have personally reviewed following labs and imaging studies  CBC: Recent Labs  Lab 11/05/20 1858 11/06/20 0524 11/06/20 1340  WBC 7.3 10.7* 9.1  NEUTROABS 5.9 8.9*  --   HGB 13.3 12.8 11.6*  HCT 40.9 39.7 36.9  MCV 97.4 98.0 99.7  PLT 159 151 233*   Basic Metabolic Panel: Recent Labs  Lab 11/05/20 1858 11/06/20 0524 11/06/20 1340  NA 135 136  --   K 4.9 4.3  --   CL 96* 102  --   CO2 26 25  --   GLUCOSE 369* 282*  --   BUN 27* 26*  --   CREATININE 0.94 1.06* 0.97  CALCIUM 9.6 9.3  --    GFR: Estimated Creatinine Clearance: 41.7 mL/min (by C-G formula based on SCr of 0.97 mg/dL). Recent Labs  Lab 11/05/20 1858 11/06/20 0524 11/06/20 1340  WBC 7.3 10.7* 9.1   Liver Function Tests: Recent Labs  Lab 11/05/20 1858 11/06/20 0524  AST 21 18  ALT 14 14  ALKPHOS 58 49  BILITOT 0.8 0.7  PROT 7.3 6.5  ALBUMIN 3.9 3.3*   No results for input(s): LIPASE, AMYLASE in the last 168 hours. No results for input(s): AMMONIA in the last 168 hours. Coagulation Profile: Recent Labs  Lab 11/06/20 1340  INR 1.1   Cardiac Enzymes: No results for input(s): CKTOTAL, CKMB, CKMBINDEX, TROPONINI in the last 168 hours. BNP (last 3 results) No results for input(s): PROBNP in the last 8760 hours. HbA1C: No results for input(s): HGBA1C in the last 72 hours. CBG: Recent Labs  Lab 11/06/20 0616 11/06/20 1216  GLUCAP 256* 170*   Lipid Profile: No results for input(s): CHOL, HDL, LDLCALC, TRIG, CHOLHDL, LDLDIRECT in the last 72 hours. Thyroid Function Tests: Recent Labs    11/06/20 1341  TSH 0.478   Anemia Panel: Recent Labs    11/06/20 1340  VITAMINB12 1,004*   Urine analysis:    Component Value Date/Time   COLORURINE YELLOW 11/05/2020 2030   APPEARANCEUR CLEAR 11/05/2020 2030   [place] 1.020 11/05/2020 2030   PHURINE 6.5 11/05/2020 2030   GLUCOSEU  250 (A) 11/05/2020 2030   HGBUR NEGATIVE 11/05/2020 2030   [place] NEGATIVE 11/05/2020 2030   [place] 11/05/2020 2030   PROTEINUR NEGATIVE 11/05/2020 2030   NITRITE NEGATIVE 11/05/2020 2030   LEUKOCYTESUR NEGATIVE 11/05/2020 2030    Radiological Exams on Admission: Personally reviewed  CT ANGIO HEAD NECK W WO CM  Result Date: 11/06/2020 CLINICAL DATA:  Ataxia,  stroke suspected EXAM: CT ANGIOGRAPHY HEAD AND NECK TECHNIQUE: Multidetector CT imaging of the head and neck was performed using the standard protocol during bolus administration of intravenous contrast. Multiplanar CT image reconstructions and MIPs were obtained to evaluate the vascular anatomy. Carotid stenosis measurements (when applicable) are obtained utilizing NASCET criteria, using the distal internal carotid diameter as the denominator. CONTRAST:  74mL OMNIPAQUE IOHEXOL 350 MG/ML SOLN COMPARISON:  CT Head 11/05/2020.  Same day MRI. FINDINGS: CT HEAD FINDINGS Brain: Known acute infarcts in the cerebellar vermis and left cerebellar hemisphere are not well characterized by CT. No acute hemorrhage or mass effect. Redemonstrated moderate to advanced chronic microvascular ischemic disease and atrophy with disproportionate right hippocampal volume loss. No hydrocephalus, midline shift, mass lesion, or extra-axial fluid collection. Vascular: See below. Skull: No acute fracture. Sinuses: Visualized sinuses are clear. Orbits: No acute findings. Review of the MIP images confirms the above findings CTA NECK FINDINGS Aortic arch: Great vessel origins are patent.  Atherosclerosis. Right carotid system: Atherosclerosis of the common carotid artery with approximately 40% stenosis distally. Mixed calcific and noncalcific atherosclerosis at the carotid bifurcation without greater than 50% stenosis relative to the distal vessel. Retropharyngeal course. Left carotid system: Atherosclerosis at the carotid bifurcation without greater than 50%  stenosis relative to the distal vessel. Vertebral arteries: Right dominant. The left vertebral artery is small throughout its course. No evidence of significant (greater than 50%) stenosis. Skeleton: Moderate multilevel degenerative disc disease. Other neck: No acute abnormality. Upper chest: Mild dependent ground-glass opacities, likely atelectasis. Otherwise, clear visualized lung apices. Review of the MIP images confirms the above findings CTA HEAD FINDINGS Anterior circulation: Bilateral intracranial ICA calcific atherosclerosis with severe greater than left paraclinoid ICA stenosis. Bilateral M1 MCAs are small but patent. No proximal M2 MCA occlusion. Small or absent right A1 ACA, likely congenital. Otherwise, patent ACAs. Posterior circulation: Small/non dominant left vertebral artery appears to largely terminate as PICA. Mild atherosclerotic narrowing of the dominant right intradural vertebral artery. The basilar artery is small with superimposed moderate atherosclerotic narrowing along its midportion. Both superior cerebellar arteries are small with nonvisualization of the mid and distal left superior cerebellar artery, likely stenotic or occluded. Fetal type PCA on the right with severe right P2 PCA stenosis. Venous sinuses: As permitted by contrast timing, patent. Anatomic variants: As detailed above Review of the MIP images confirms the above findings IMPRESSION: CT head: 1. Known acute infarcts in the cerebellar vermis and left cerebellar hemisphere are not well characterized by CT. No acute hemorrhage or mass effect. 2. Redemonstrated moderate to advanced chronic microvascular ischemic disease and atrophy with disproportionate right hippocampal volume loss. CTA head: 1. Severe right greater than left paraclinoid ICA stenosis. 2. The superior cerebellar arteries are difficult to assess due to their small size with nonvisualization of the mid and distal left superior cerebellar artery, likely stenotic or  occluded. 3. Small vertebrobasilar system with superimposed moderate narrowing of the mid basilar artery and severe right P2 PCA stenosis. CTA neck: 1. Approximately 40% stenosis of the distal right common carotid artery. 2. Bilateral carotid bifurcation atherosclerosis without greater than 50% stenosis. Electronically Signed   By: Margaretha Sheffield M.D.   On: 11/06/2020 13:27   DG Chest 2 View  Result Date: 11/05/2020 CLINICAL DATA:  Weakness, fall EXAM: CHEST - 2 VIEW COMPARISON:  09/25/2017 FINDINGS: Lungs are clear.  No pleural effusion or pneumothorax. The heart is normal in size. Mild degenerative changes of the visualized thoracolumbar spine. IMPRESSION: Normal chest  radiographs. Electronically Signed   By: Julian Hy M.D.   On: 11/05/2020 19:32   CT HEAD WO CONTRAST (5MM)  Result Date: 11/05/2020 CLINICAL DATA:  Mental status change. EXAM: CT HEAD WITHOUT CONTRAST TECHNIQUE: Contiguous axial images were obtained from the base of the skull through the vertex without intravenous contrast. COMPARISON:  None. FINDINGS: Brain: No acute intracranial hemorrhage. No focal mass lesion. No CT evidence of acute infarction. No midline shift or mass effect. No hydrocephalus. Basilar cisterns are patent. There are periventricular and subcortical white matter hypodensities. Generalized cortical atrophy. Vascular: No hyperdense vessel or unexpected calcification. Skull: Normal. Negative for fracture or focal lesion. Sinuses/Orbits: Paranasal sinuses and mastoid air cells are clear. Orbits are clear. Other: None. IMPRESSION: 1. No acute intracranial findings. 2. Chronic atrophy and periventricular white matter microvascular disease. Electronically Signed   By: Suzy Bouchard M.D.   On: 11/05/2020 19:33   MR BRAIN WO CONTRAST  Result Date: 11/06/2020 CLINICAL DATA:  Increased lethargy, reported fall EXAM: MRI HEAD WITHOUT CONTRAST TECHNIQUE: Multiplanar, multiecho pulse sequences of the brain and  surrounding structures were obtained without intravenous contrast. COMPARISON:  Noncontrast CT head obtained 1 day prior FINDINGS: Brain: There are small acute to early subacute infarcts in the cerebellar vermis and left cerebellar hemisphere in the SCA distribution. There is no associated hemorrhage. There is no other acute infarct. There is no extra-axial fluid collection. There is moderate global parenchymal volume loss with disproportionate severe right hippocampal atrophy. There is moderate left hippocampal atrophy. There is commensurate ex vacuo dilatation of the ventricular system. There is extensive confluent FLAIR signal abnormality throughout the subcortical and periventricular white matter likely reflecting sequela of advanced chronic white matter microangiopathy. There is no mass lesion.  There is no midline shift. Vascular: Normal flow voids. Skull and upper cervical spine: Normal marrow signal. Sinuses/Orbits: The imaged paranasal sinuses are clear. Bilateral lens implants are in place. The globes and orbits are otherwise unremarkable. Other: None. IMPRESSION: 1. Small early subacute infarcts in the cerebellar vermis and left cerebellar hemisphere in the SCA distribution. 2. Global parenchymal volume loss with disproportionate severe right hippocampal atrophy. 3. Advanced chronic white matter microangiopathy. Electronically Signed   By: Valetta Mole M.D.   On: 11/06/2020 09:34    EKG: Independently reviewed.  Normal sinus rhythm, no acute ST-T changes, QTC 453 ms     Assessment and Plan:   Principal Problem:   CVA (cerebral vascular accident) ([place]) Active Problems:   Essential hypertension   Dementia without behavioral disturbance ([place])   Type 2 diabetes mellitus with other specified complication ([facility])   CKD (chronic kidney disease), stage III ([place])    1.  Acute/subacute cerebellar CVA: Explaining patient's ataxia.  MRI results as above-worrisome for thromboembolic versus  microangiopathic infarcts.    Obtained CTA of the head which now reports  severe right greater than left paraclinoid ICA stenosis, superior cerebellar arteries are difficult to assess due to their small size with nonvisualization of the mid and distal left superior cerebellar artery, likely stenotic or occluded.Small vertebrobasilar system with superimposed moderate narrowing of the mid basilar artery and severe right P2 PCA stenosis.CTA neck:Approximately 40% stenosis of the distal right common carotid artery.Bilateral carotid bifurcation atherosclerosis. According to the daughter patient not been taking aspirin 81 mg as prescribed.  Will resume aspirin for now, prescribe high intensity statins and await further neurology recommendations.  Follow-up echo results. Given cerebellar CVA, will monitor closely on progressive unit with cardiac monitoring.  Permissive hypertension for now.   Delirium/fall precautions.  Lipid profile in a.m.Neurochecks, PT/OT/speech therapy evaluation.  Follow-up neurology recommendations  2.  Diabetes mellitus type 2: Will resume Lantus if tolerating diet, sliding scale insulin.  3.  ?CKD stage IIIa: Creatinine currently stable at 0.9-1.  Monitor for now.  4.  Osteoporosis: Resume vitamin D, calcium supplementation.  5.  Hypertension, hyperlipidemia: According to daughter patient been off antihypertensives due to early morning hypotension.  Previously noted to be on Norvasc per home med list.  Resume other home medications for hyperlipidemia and obtain lipid profile in AM.  6. Dementia: Pleasantly confused and appears to be at baseline currently.  Has occasional hallucinations at baseline.  Delirium precautions.  Avoid benzodiazepines.  7.  GERD: Resume home meds  DVT prophylaxis: Lovenox  COVID screen: Negative yesterday  Code Status: Full code as confirmed with daughter   .Health care proxy would be daughter, Renaee Munda  Patient/Family Communication: Discussed  with daughter at bedside and all questions answered to satisfaction.  Consults called: Neurology called by EDP Admission status :I certify that at the point of admission it is my clinical judgment that the patient will require inpatient hospital care spanning beyond 2 midnights from the point of admission due to high intensity of service and high frequency of surveillance required.Inpatient status is judged to be reasonable and necessary in order to provide the required intensity of service to ensure the patient's safety. The patient's presenting symptoms, physical exam findings, and initial radiographic and laboratory data in the context of their chronic comorbidities is felt to place them at high risk for further clinical deterioration. The following factors support the patient status of inpatient : Acute cerebellar CVA     Guilford Shi MD [facility] Pager in [place]  If 7PM-7AM, please contact night-coverage [email or web address]   11/06/2020, 3:03 PM

## 2020-11-06 NOTE — ED Notes (Signed)
Pt back from MRI 

## 2020-11-06 NOTE — ED Notes (Signed)
Talked with md, pt is ok for PO, will give juice for pt's blood sugar.

## 2020-11-06 NOTE — ED Provider Notes (Signed)
Emergency Medicine Provider Triage Evaluation Note  Stacey Fuentes , a 85 y.o. female  was evaluated in triage.  Level 5 caveat 2/2 dementia. Per EMS, pt presenting via EMS for AMS and increased lethargy. Reported fall yesterday. Per [facility] at [facility] 12 hours ago, patient fell from standing into her chair, did not hit her head.  [place] when walking with daughter but daughter was able to catch her against the wall, did not hit head. Patient denies headache, chest pain, abdominal pain.  Review of Systems  Positive: As above Negative: As above  Physical Exam  BP (!) 165/84 (BP Location: Left Arm)   Pulse 95   Resp 18   SpO2 96%  Gen:   Somnolent, but wakes easily to loud voice Resp:  Normal effort. Lungs CTAB. Cardiac: Heart RRR Other:  Neurologic exam challenging due to cooperation, but without obvious focal deficits. No facial asymmetry. Speech clear.  Medical Decision Making  Medically screening exam initiated at 5:07 AM.  Appropriate orders placed.  Marciel Offenberger was informed that the remainder of the evaluation will be completed by another provider, this initial triage assessment does not replace that evaluation, and the importance of remaining in the ED until their evaluation is complete.  AMS   Antonietta Breach, PA-C 11/06/20 [id number]    Merryl Hacker, MD 11/06/20 [phone]

## 2020-11-06 NOTE — ED Notes (Signed)
Safety mitts placed on patient. All monitoring cords in place currently. TV turned on, patient comfortable.

## 2020-11-06 NOTE — ED Notes (Signed)
Md paged to give glucose result and to talk about po status.

## 2020-11-06 NOTE — ED Triage Notes (Signed)
Patient arrived with EMS from home family reported altered LOC with lethargy onset yesterday , CBG= 360, NS 250 ml IV given by EMS , history of dementia .

## 2020-11-06 NOTE — Evaluation (Signed)
Physical Therapy Evaluation Patient Details Name: Stacey Fuentes MRN: [id number] DOB: 21-Mar-1935 Today's Date: 11/06/2020  History of Present Illness  Pt is an 85 y/o female admitted 9/27 secondary to worsening weakness and ambulatory function. Imaging showed infarct in cerebellar vermis and L cerebellar hemisphere. PMH includes dementia, HTN, CKD, DM, osteoporosis, and tremors.  Clinical Impression  Pt admitted secondary to problem above with deficits below. Pt requiring mod A +2 for bed mobility and min to mod A +2 to stand and take steps at EOB. Pt with poor coordination and required max multimodal cues for sequencing. Pt's daughter reports pt was previously mod I for gait with occasional use of cane. Recommending CIR level therapies to increase independence and safety with mobility. Will continue to follow acutely.        Recommendations for follow up therapy are one component of a multi-disciplinary discharge planning process, led by the attending physician.  Recommendations may be updated based on patient status, additional functional criteria and insurance authorization.  Follow Up Recommendations CIR    Equipment Recommendations  Wheelchair cushion (measurements PT);Wheelchair (measurements PT)    Recommendations for Other Services       Precautions / Restrictions Precautions Precautions: Fall Restrictions Weight Bearing Restrictions: No      Mobility  Bed Mobility Overal bed mobility: Needs Assistance Bed Mobility: Supine to Sit;Sit to Supine     Supine to sit: Mod assist;+2 for physical assistance Sit to supine: Mod assist;+2 for physical assistance   General bed mobility comments: Required assist for trunk and LE assist throughout.    Transfers Overall transfer level: Needs assistance Equipment used: 2 person hand held assist Transfers: Sit to/from Stand Sit to Stand: Min assist;+2 physical assistance         General transfer comment: Min A for lift assist  and steadying.  Ambulation/Gait Ambulation/Gait assistance: Min assist;Mod assist;+2 physical assistance   Assistive device: 2 person hand held assist Gait Pattern/deviations: Ataxic;Decreased stride length Gait velocity: Decreased   General Gait Details: Min to mod A for steadying. Pt with difficulty sequencing and required multimodal cues to take steps at EOB. Pt also ataxic when taking steps.  Stairs            Wheelchair Mobility    Modified Rankin (Stroke Patients Only) Modified Rankin (Stroke Patients Only) Pre-Morbid Rankin Score: Slight disability Modified Rankin: Moderately severe disability     Balance Overall balance assessment: Needs assistance Sitting-balance support: No upper extremity supported;Feet supported Sitting balance-Leahy Scale: Fair     Standing balance support: Bilateral upper extremity supported;During functional activity Standing balance-Leahy Scale: Poor Standing balance comment: Reliant on BUE support                             Pertinent Vitals/Pain Pain Assessment: No/denies pain    Home Living Family/patient expects to be discharged to:: Private residence Living Arrangements: Children Available Help at Discharge: Family;Available 24 hours/day Type of Home: House Home Access: Stairs to enter Entrance Stairs-Rails: Psychiatric nurse of Steps: 4-5 Home Layout: One level Home Equipment: Cane - single point;Walker - 2 wheels;Shower seat      Prior Function Level of Independence: Needs assistance   Gait / Transfers Assistance Needed: Daughter reports occasional use of cane for ambulation  ADL's / Homemaking Assistance Needed: Daughter reports she occasionally has to help with tub transfers, but is independent with bathing and dressing        Hand Dominance  Extremity/Trunk Assessment   Upper Extremity Assessment Upper Extremity Assessment: Defer to OT evaluation    Lower Extremity  Assessment Lower Extremity Assessment: Generalized weakness (ataxia noted bilaterally during functional mobility)    Cervical / Trunk Assessment Cervical / Trunk Assessment: Normal  Communication   Communication: HOH;Expressive difficulties  Cognition Arousal/Alertness: Awake/alert Behavior During Therapy: WFL for tasks assessed/performed Overall Cognitive Status: History of cognitive impairments - at baseline                                 General Comments: Dementia at baseline. Easily directable throughout.      General Comments General comments (skin integrity, edema, etc.): Pt's daughter present during session    Exercises     Assessment/Plan    PT Assessment Patient needs continued PT services  PT Problem List Decreased strength;Decreased activity tolerance;Decreased balance;Decreased mobility;Decreased coordination;Decreased cognition;Decreased knowledge of use of DME;Decreased safety awareness;Decreased knowledge of precautions       PT Treatment Interventions DME instruction;Gait training;Therapeutic activities;Stair training;Functional mobility training;Therapeutic exercise;Balance training;Patient/family education;Neuromuscular re-education;Cognitive remediation    PT Goals (Current goals can be found in the Care Plan section)  Acute Rehab PT Goals Patient Stated Goal: for pt to be able to walk better per daughter PT Goal Formulation: With patient/family Time For Goal Achievement: 11/20/20 Potential to Achieve Goals: Good    Frequency Min 4X/week   Barriers to discharge        Co-evaluation               AM-PAC PT "6 Clicks" Mobility  Outcome Measure Help needed turning from your back to your side while in a flat bed without using bedrails?: A Lot Help needed moving from lying on your back to sitting on the side of a flat bed without using bedrails?: A Lot Help needed moving to and from a bed to a chair (including a wheelchair)?:  Total Help needed standing up from a chair using your arms (e.g., wheelchair or bedside chair)?: Total Help needed to walk in hospital room?: Total Help needed climbing 3-5 steps with a railing? : Total 6 Click Score: 8    End of Session Equipment Utilized During Treatment: Gait belt Activity Tolerance: Patient tolerated treatment well Patient left: in bed;with call bell/phone within reach (on stretcher in ED) Nurse Communication: Mobility status PT Visit Diagnosis: Unsteadiness on feet (R26.81);Muscle weakness (generalized) (M62.81);Difficulty in walking, not elsewhere classified (R26.2);Ataxic gait (R26.0)    Time: 9741-6384 PT Time Calculation (min) (ACUTE ONLY): 15 min   Charges:   PT Evaluation $PT Eval Moderate Complexity: 1 Mod          Reuel Derby, PT, DPT  Acute Rehabilitation Services  Pager: [phone] Office: [phone]   Rudean Hitt 11/06/2020, 4:33 PM

## 2020-11-06 NOTE — ED Notes (Signed)
After talking with md, pt Ok for po, meds given, meal tray given.

## 2020-11-06 NOTE — ED Notes (Signed)
Pt standing in room naked, pt helped back into bed, daughter at bedside, pt very uncooperative, pt refuses to drink water for swallow screen, md notified, meds held for swallow screen.  Pt also uncooperative during neuro assessment, pt refuses to preform many of the tasks.

## 2020-11-06 NOTE — ED Notes (Signed)
Patients brief changed.  °

## 2020-11-07 ENCOUNTER — Inpatient Hospital Stay (HOSPITAL_COMMUNITY): Payer: Medicare Other

## 2020-11-07 DIAGNOSIS — I639 Cerebral infarction, unspecified: Secondary | ICD-10-CM | POA: Diagnosis not present

## 2020-11-07 DIAGNOSIS — I6389 Other cerebral infarction: Secondary | ICD-10-CM

## 2020-11-07 DIAGNOSIS — I63549 Cerebral infarction due to unspecified occlusion or stenosis of unspecified cerebellar artery: Secondary | ICD-10-CM | POA: Diagnosis not present

## 2020-11-07 LAB — URINALYSIS, ROUTINE W REFLEX MICROSCOPIC
Bilirubin Urine: NEGATIVE
Glucose, UA: NEGATIVE mg/dL
Hgb urine dipstick: NEGATIVE
Ketones, ur: NEGATIVE mg/dL
Leukocytes,Ua: NEGATIVE
Nitrite: NEGATIVE
Protein, ur: NEGATIVE mg/dL
Specific Gravity, Urine: 1.008 (ref 1.005–1.030)
pH: 8 (ref 5.0–8.0)

## 2020-11-07 LAB — CBC WITH DIFFERENTIAL/PLATELET
Abs Immature Granulocytes: 0.02 10*3/uL (ref 0.00–0.07)
Basophils Absolute: 0 10*3/uL (ref 0.0–0.1)
Basophils Relative: 1 %
Eosinophils Absolute: 0.1 10*3/uL (ref 0.0–0.5)
Eosinophils Relative: 2 %
HCT: 36.9 % (ref 36.0–46.0)
Hemoglobin: 11.8 g/dL — ABNORMAL LOW (ref 12.0–15.0)
Immature Granulocytes: 0 %
Lymphocytes Relative: 25 %
Lymphs Abs: 2 10*3/uL (ref 0.7–4.0)
MCH: 31.1 pg (ref 26.0–34.0)
MCHC: 32 g/dL (ref 30.0–36.0)
MCV: 97.4 fL (ref 80.0–100.0)
Monocytes Absolute: 0.6 10*3/uL (ref 0.1–1.0)
Monocytes Relative: 8 %
Neutro Abs: 5.2 10*3/uL (ref 1.7–7.7)
Neutrophils Relative %: 64 %
Platelets: 148 10*3/uL — ABNORMAL LOW (ref 150–400)
RBC: 3.79 MIL/uL — ABNORMAL LOW (ref 3.87–5.11)
RDW: 12 % (ref 11.5–15.5)
WBC: 8 10*3/uL (ref 4.0–10.5)
nRBC: 0 % (ref 0.0–0.2)

## 2020-11-07 LAB — COMPREHENSIVE METABOLIC PANEL
ALT: 16 U/L (ref 0–44)
AST: 36 U/L (ref 15–41)
Albumin: 3.4 g/dL — ABNORMAL LOW (ref 3.5–5.0)
Alkaline Phosphatase: 48 U/L (ref 38–126)
Anion gap: 6 (ref 5–15)
BUN: 13 mg/dL (ref 8–23)
CO2: 26 mmol/L (ref 22–32)
Calcium: 9.4 mg/dL (ref 8.9–10.3)
Chloride: 108 mmol/L (ref 98–111)
Creatinine, Ser: 0.91 mg/dL (ref 0.44–1.00)
GFR, Estimated: >60 mL/min (ref >60)
Glucose, Bld: 80 mg/dL (ref 70–99)
Potassium: 3.5 mmol/L (ref 3.5–5.1)
Sodium: 140 mmol/L (ref 135–145)
Total Bilirubin: 0.8 mg/dL (ref 0.3–1.2)
Total Protein: 6.1 g/dL — ABNORMAL LOW (ref 6.5–8.1)

## 2020-11-07 LAB — LIPID PANEL
Cholesterol: 201 mg/dL — ABNORMAL HIGH (ref 0–200)
HDL: 65 mg/dL (ref >40)
LDL Cholesterol: 122 mg/dL — ABNORMAL HIGH (ref 0–99)
Total CHOL/HDL Ratio: 3.1 RATIO
Triglycerides: 72 mg/dL (ref <150)
VLDL: 14 mg/dL (ref 0–40)

## 2020-11-07 LAB — ECHOCARDIOGRAM COMPLETE
AR max vel: 2.66 cm2
AV Area VTI: 2.56 cm2
AV Area mean vel: 2.44 cm2
AV Mean grad: 5 mmHg
AV Peak grad: 7.4 mmHg
Ao pk vel: 1.36 m/s
Area-P 1/2: 4.06 cm2
S' Lateral: 2.3 cm

## 2020-11-07 LAB — CBG MONITORING, ED
Glucose-Capillary: 84 mg/dL (ref 70–99)
Glucose-Capillary: 77 mg/dL (ref 70–99)
Glucose-Capillary: 90 mg/dL (ref 70–99)
Glucose-Capillary: 115 mg/dL — ABNORMAL HIGH (ref 70–99)

## 2020-11-07 LAB — HEMOGLOBIN A1C
Hgb A1c MFr Bld: 8.3 % — ABNORMAL HIGH (ref 4.8–5.6)
Mean Plasma Glucose: 192 mg/dL

## 2020-11-07 LAB — GLUCOSE, CAPILLARY: Glucose-Capillary: 171 mg/dL — ABNORMAL HIGH (ref 70–99)

## 2020-11-07 MED ORDER — AMLODIPINE BESYLATE 5 MG PO TABS
5.0000 mg | ORAL_TABLET | Freq: Every day | ORAL | Status: DC
  Administered 2020-11-07 – 2020-11-12 (×6): 5 mg via ORAL
  Filled 2020-11-07: qty 1
  Filled 2020-11-07: qty 2
  Filled 2020-11-07 (×6): qty 1

## 2020-11-07 MED ORDER — INFLUENZA VAC A&B SA ADJ QUAD 0.5 ML IM PRSY
0.5000 mL | PREFILLED_SYRINGE | INTRAMUSCULAR | Status: AC
  Administered 2020-11-08: 0.5 mL via INTRAMUSCULAR
  Filled 2020-11-07: qty 0.5

## 2020-11-07 MED ORDER — INSULIN GLARGINE-YFGN 100 UNIT/ML ~~LOC~~ SOLN
15.0000 [IU] | Freq: Every day | SUBCUTANEOUS | Status: DC
  Administered 2020-11-08: 15 [IU] via SUBCUTANEOUS
  Filled 2020-11-07 (×3): qty 0.15

## 2020-11-07 MED ORDER — HALOPERIDOL LACTATE 5 MG/ML IJ SOLN
2.5000 mg | Freq: Once | INTRAMUSCULAR | Status: AC
  Administered 2020-11-07: 2.5 mg via INTRAVENOUS
  Filled 2020-11-07: qty 1

## 2020-11-07 NOTE — Progress Notes (Signed)
HOSPITAL MEDICINE OVERNIGHT EVENT NOTE    Nursing reports the patient has become increasingly agitated throughout the night.  Patient is pulling at medical hardware, frequently moving and has even gotten out of bed without assistance placing herself at an extremely high fall risk and nearly pulling her IV out.  Nursing has attempted to frequently redirect the patient, asked family for assistance and address patient's every need without improvement in patient's agitation.  We will attempt a one-time dose of 2.5 mg of intravenous Haldol while monitoring patient on telemetry.  Admission EKG reviewed, normal QTc interval.  Stacey Emerald  MD [facility]

## 2020-11-07 NOTE — Progress Notes (Signed)
  Echocardiogram 2D Echocardiogram has been performed.  Merrie Roof F 11/07/2020, 8:47 AM

## 2020-11-07 NOTE — Progress Notes (Signed)
Physical Therapy Treatment Patient Details Name: Stacey Fuentes MRN: [id number] DOB: 08-29-1935 Today's Date: 11/07/2020   History of Present Illness Pt is an 85 y/o female admitted 9/27 secondary to worsening weakness and ambulatory function. Imaging showed infarct in cerebellar vermis and L cerebellar hemisphere. PMH includes dementia, HTN, CKD, DM, osteoporosis, and tremors.    PT Comments    Pt progressing towards goals. Was able to transfer to/from [facility] with mod A +2 and required max A +2 to perform bed mobility tasks. Pt continues to present with ataxia during mobility and difficulty sequencing. Required max multimodal cues to complete tasks. Current recommendations for CIR appropriate. Will continue to follow acutely.     Recommendations for follow up therapy are one component of a multi-disciplinary discharge planning process, led by the attending physician.  Recommendations may be updated based on patient status, additional functional criteria and insurance authorization.  Follow Up Recommendations  CIR     Equipment Recommendations  Wheelchair cushion (measurements PT);Wheelchair (measurements PT)    Recommendations for Other Services       Precautions / Restrictions Precautions Precautions: Fall Restrictions Weight Bearing Restrictions: No     Mobility  Bed Mobility Overal bed mobility: Needs Assistance Bed Mobility: Supine to Sit;Sit to Supine     Supine to sit: +2 for physical assistance;Max assist Sit to supine: +2 for physical assistance;Max assist   General bed mobility comments: Required assist for trunk and LE assist throughout. Also required assist to scoot hips to EOB    Transfers Overall transfer level: Needs assistance Equipment used: 2 person hand held assist Transfers: Sit to/from [facility] Sit to Stand: +2 physical assistance;Mod assist;+2 safety/equipment Stand pivot transfers: +2 physical assistance;Mod assist        General transfer comment: mod A to boost into standing and assist to for pivot as she demonstrates difficulty initiating movement. Max multimodal cues for sequencing to take steps to/from [facility].  Ambulation/Gait                 Stairs             Wheelchair Mobility    Modified Rankin (Stroke Patients Only) Modified Rankin (Stroke Patients Only) Pre-Morbid Rankin Score: Slight disability Modified Rankin: Moderately severe disability     Balance Overall balance assessment: Needs assistance Sitting-balance support: No upper extremity supported;Feet supported Sitting balance-Leahy Scale: Fair     Standing balance support: Bilateral upper extremity supported;During functional activity Standing balance-Leahy Scale: Poor Standing balance comment: Reliant on BUE support and up to mod A for static standing                            Cognition Arousal/Alertness: Awake/alert Behavior During Therapy: WFL for tasks assessed/performed Overall Cognitive Status: History of cognitive impairments - at baseline                                 General Comments: Dementia at baseline.      Exercises      General Comments General comments (skin integrity, edema, etc.): daughter present      Pertinent Vitals/Pain Pain Assessment: No/denies pain    Home Living Family/patient expects to be discharged to:: Private residence Living Arrangements: Children Available Help at Discharge: Family;Available 24 hours/day Type of Home: House Home Access: Stairs to enter Entrance Stairs-Rails: Right;Left Home Layout: One level Home Equipment: Kasandra Knudsen -  single point;Walker - 2 wheels;Shower seat      Prior Function Level of Independence: Needs assistance  Gait / Transfers Assistance Needed: Daughter reports occasional use of cane for ambulation ADL's / Homemaking Assistance Needed: Daughter reports she has to help pt initiate self feeding and brushing teeth.   She is able to bathe with supervision/set up.  She requires assist for dressing and tub transfers.  She is able to complete toileting mod I, but does have episodes of incontinence     PT Goals (current goals can now be found in the care plan section) Acute Rehab PT Goals Patient Stated Goal: for pt to be able to walk better per daughter PT Goal Formulation: With patient/family Time For Goal Achievement: 11/20/20 Potential to Achieve Goals: Good Progress towards PT goals: Progressing toward goals    Frequency    Min 4X/week      PT Plan Current plan remains appropriate    Co-evaluation PT/OT/SLP Co-Evaluation/Treatment: Yes Reason for Co-Treatment: For patient/therapist safety;To address functional/ADL transfers PT goals addressed during session: Mobility/safety with mobility;Balance OT goals addressed during session: ADL's and self-care      AM-PAC PT "6 Clicks" Mobility   Outcome Measure  Help needed turning from your back to your side while in a flat bed without using bedrails?: Total Help needed moving from lying on your back to sitting on the side of a flat bed without using bedrails?: Total Help needed moving to and from a bed to a chair (including a wheelchair)?: Total Help needed standing up from a chair using your arms (e.g., wheelchair or bedside chair)?: Total Help needed to walk in hospital room?: Total Help needed climbing 3-5 steps with a railing? : Total 6 Click Score: 6    End of Session Equipment Utilized During Treatment: Gait belt Activity Tolerance: Patient tolerated treatment well Patient left: in bed;with call bell/phone within reach (on stretcher in ED) Nurse Communication: Mobility status PT Visit Diagnosis: Unsteadiness on feet (R26.81);Muscle weakness (generalized) (M62.81);Difficulty in walking, not elsewhere classified (R26.2);Ataxic gait (R26.0)     Time: 1030-1059 PT Time Calculation (min) (ACUTE ONLY): 29 min  Charges:  $Therapeutic  Activity: 8-22 mins                     Lou Miner, DPT  Acute Rehabilitation Services  Pager: [phone] Office: [phone]    Rudean Hitt 11/07/2020, 11:41 AM

## 2020-11-07 NOTE — Progress Notes (Signed)
Inpatient Rehab Admissions Coordinator:   Per therapy recommendations patient was screened for CIR candidacy by Clemens Catholic, MS, CCC-SLP. At this time, Pt. Appears to demonstrate medical necessity, functional decline, and ability to tolerate intensity of CIR. Pt. is a potential candidate for CIR. I will place   order for rehab consult per protocol for full assessment. Please contact me any with questions.Clemens Catholic, [place], [place] Admissions Coordinator  [phone] ([place]) [phone] (office)

## 2020-11-07 NOTE — Plan of Care (Signed)
  Problem: Clinical Measurements: Goal: Ability to maintain clinical measurements within normal limits will improve Outcome: Progressing Goal: Will remain free from infection Outcome: Progressing Goal: Diagnostic test results will improve Outcome: Progressing Goal: Respiratory complications will improve Outcome: Progressing Goal: Cardiovascular complication will be avoided Outcome: Progressing   Problem: Nutrition: Goal: Adequate nutrition will be maintained Outcome: Progressing   Problem: Coping: Goal: Level of anxiety will decrease Outcome: Progressing   Problem: Elimination: Goal: Will not experience complications related to urinary retention Outcome: Progressing   Problem: Safety: Goal: Ability to remain free from injury will improve Outcome: Progressing   Problem: Skin Integrity: Goal: Risk for impaired skin integrity will decrease Outcome: Progressing

## 2020-11-07 NOTE — ED Notes (Signed)
Pt tx to [facility]

## 2020-11-07 NOTE — ED Notes (Signed)
Patient was found out of bed at sink with blood spattered everywhere. IV pigtail snapped, IV stay intact. Patient placed back in bed. [place] bed alarm placed, new gown, and safety mitts. Dr. Cyd Silence made aware. Awaiting order. Patient is very restless.

## 2020-11-07 NOTE — Evaluation (Signed)
Occupational Therapy Evaluation Patient Details Name: Stacey Fuentes MRN: [id number] DOB: 08-17-1935 Today's Date: 11/07/2020   History of Present Illness Pt is an 85 y/o female admitted 9/27 secondary to worsening weakness and ambulatory function. Imaging showed infarct in cerebellar vermis and L cerebellar hemisphere. PMH includes dementia, HTN, CKD, DM, osteoporosis, and tremors.   Clinical Impression   Pt admitted with above. She demonstrates the below listed deficits and will benefit from continued OT to maximize safety and independence with BADLs.  Pt presents to OT with generalized weakness, impaired cognition, decreased activity tolerance, impaired balance.  She currently requires min - mod A for UB ADLs and mod - max A for LB ADLs.  She requires max A +2 for bed mobility on ED stretcher, and mod A +2 for functional transfers.  She lives with her daughter PTA, and was able to complete bathing, grooming, self feeding and toileting mod I - min A, and was mod I with ambulation.  Feel she would recommend CIR level rehab at discharge.        Recommendations for follow up therapy are one component of a multi-disciplinary discharge planning process, led by the attending physician.  Recommendations may be updated based on patient status, additional functional criteria and insurance authorization.   Follow Up Recommendations  CIR    Equipment Recommendations  3 in 1 bedside commode    Recommendations for Other Services Rehab consult     Precautions / Restrictions Precautions Precautions: Fall      Mobility Bed Mobility Overal bed mobility: Needs Assistance Bed Mobility: Supine to Sit;Sit to Supine     Supine to sit: +2 for physical assistance;Max assist Sit to supine: +2 for physical assistance;Max assist   General bed mobility comments: Required assist for trunk and LE assist throughout.    Transfers Overall transfer level: Needs assistance Equipment used: 2 person hand  held assist Transfers: Sit to/from Stand Sit to Stand: +2 physical assistance;Mod assist;+2 safety/equipment         General transfer comment: mod A to boost into standing and assist to for pivot as she demonstrates difficulty initiating movement    Balance Overall balance assessment: Needs assistance Sitting-balance support: No upper extremity supported;Feet supported Sitting balance-Leahy Scale: Fair     Standing balance support: Bilateral upper extremity supported;During functional activity Standing balance-Leahy Scale: Poor Standing balance comment: Reliant on BUE support and up to mod A for static standing                           ADL either performed or assessed with clinical judgement   ADL Overall ADL's : Needs assistance/impaired Eating/Feeding: Minimal assistance;Sitting;Bed level   Grooming: Oral care;Wash/dry face;Wash/dry hands;Moderate assistance;Sitting;Bed level   Upper Body Bathing: Maximal assistance;Sitting   Lower Body Bathing: Maximal assistance;Sit to/from stand   Upper Body Dressing : Maximal assistance;Sitting   Lower Body Dressing: Total assistance;Sit to/from stand   Toilet Transfer: Moderate assistance;+2 for physical assistance;+2 for safety/equipment;BSC;Stand-pivot   Toileting- Clothing Manipulation and Hygiene: Total assistance;Sit to/from stand Toileting - Clothing Manipulation Details (indicate cue type and reason): pt incontinent of urine     Functional mobility during ADLs: Moderate assistance;+2 for physical assistance;+2 for safety/equipment       Vision Baseline Vision/History: 0 No visual deficits Ability to See in Adequate Light: 0 Adequate Additional Comments: pt denies blurry vision.  She demostrates difficulty following commands for visual testing.  EOMs full     Perception  Perception Perception Tested?: Yes   Praxis Praxis Praxis tested?: Deficits Deficits: Initiation    Pertinent Vitals/Pain Pain  Assessment: No/denies pain     Hand Dominance Right   Extremity/Trunk Assessment Upper Extremity Assessment Upper Extremity Assessment: Generalized weakness (difficult to accurately assess as pt is slow to initiate movement on command, but moves spontaneously on her own)   Lower Extremity Assessment Lower Extremity Assessment: Defer to PT evaluation   Cervical / Trunk Assessment Cervical / Trunk Assessment: Normal   Communication Communication Communication: HOH;Expressive difficulties   Cognition Arousal/Alertness: Awake/alert Behavior During Therapy: WFL for tasks assessed/performed Overall Cognitive Status: History of cognitive impairments - at baseline                                 General Comments: Dementia at baseline. Easily directable throughout.   General Comments  daughter present throughout    Exercises     Shoulder Instructions      Home Living Family/patient expects to be discharged to:: Private residence Living Arrangements: Children Available Help at Discharge: Family;Available 24 hours/day Type of Home: House Home Access: Stairs to enter [employer] of Steps: 4-5 Entrance Stairs-Rails: Right;Left Home Layout: One level     Bathroom Shower/Tub: Teacher, early years/pre: Standard     Home Equipment: Cane - single point;Walker - 2 wheels;Shower seat          Prior Functioning/Environment Level of Independence: Needs assistance  Gait / Transfers Assistance Needed: Daughter reports occasional use of cane for ambulation ADL's / Homemaking Assistance Needed: Daughter reports she has to help pt initiate self feeding and brushing teeth.  She is able to bathe with supervision/set up.  She requires assist for dressing and tub transfers.  She is able to complete toileting mod I, but does have episodes of incontinence            OT Problem List: Decreased strength;Decreased activity tolerance;Impaired balance  (sitting and/or standing);Decreased coordination;Decreased cognition;Decreased safety awareness;Impaired UE functional use      OT Treatment/Interventions: Self-care/ADL training;Neuromuscular education;DME and/or AE instruction;Therapeutic activities;Cognitive remediation/compensation;Patient/family education;Balance training    OT Goals(Current goals can be found in the care plan section) Acute Rehab OT Goals Patient Stated Goal: for pt to be able to walk better per daughter OT Goal Formulation: With patient/family Time For Goal Achievement: 11/21/20 Potential to Achieve Goals: Good ADL Goals Pt Will Perform Eating: with set-up;sitting;with adaptive utensils;bed level Pt Will Perform Grooming: with min assist;sitting Pt Will Perform Upper Body Bathing: with min assist;sitting Pt Will Perform Lower Body Bathing: with mod assist;sit to/from stand Pt Will Transfer to Toilet: with mod assist;stand pivot transfer;bedside commode Pt Will Perform Toileting - Clothing Manipulation and hygiene: with mod assist;sit to/from stand  OT Frequency: Min 2X/week   Barriers to D/C:            Co-evaluation PT/OT/SLP Co-Evaluation/Treatment: Yes Reason for Co-Treatment: For patient/therapist safety;To address functional/ADL transfers   OT goals addressed during session: ADL's and self-care      AM-PAC OT "6 Clicks" Daily Activity     Outcome Measure Help from another person eating meals?: A Little Help from another person taking care of personal grooming?: A Lot Help from another person toileting, which includes using toliet, bedpan, or urinal?: A Lot Help from another person bathing (including washing, rinsing, drying)?: A Lot Help from another person to put on and taking off regular upper body clothing?:  A Lot Help from another person to put on and taking off regular lower body clothing?: Total 6 Click Score: 12   End of Session Nurse Communication: Mobility status  Activity Tolerance:  Patient tolerated treatment well Patient left: in bed;with call bell/phone within reach;with family/visitor present  OT Visit Diagnosis: Unsteadiness on feet (R26.81);Cognitive communication deficit (R41.841) Symptoms and signs involving cognitive functions: Cerebral infarction                Time: 1025-1059 OT Time Calculation (min): 34 min Charges:  OT General Charges $OT Visit: 1 Visit OT Evaluation $OT Eval Moderate Complexity: 1 Mod  Nilsa Nutting., OTR/L Acute Rehabilitation Services Pager [phone] Office [phone]   Lucille Passy M 11/07/2020, 11:14 AM

## 2020-11-07 NOTE — Progress Notes (Signed)
Patient refusing medication and CBG check at this time after multiple attempts. Patient desires to sleep

## 2020-11-07 NOTE — Progress Notes (Signed)
STROKE TEAM PROGRESS NOTE   INTERVAL HISTORY Daughter is feeding patient at bedside. She is tolerating without apparent distress. She is able to state her name and speak a simple sentence when asked.  Following commands.  Suddenly became unable to walk or speak. Left ED waiting room.  Hallucinations worsening lately. She tried Namenda for one week only and then stopped because daughter thought it was keeping her up at night. She walked with a cane prior to admission.  Discussed stroke diagnosis, ongoing work up and plan of care with daughter at bedside.  PT/OT/ST pending. Daughter is willing to consider placement for rehab.  MRI scan shows small cerebellar midline vermis and left superior cerebellar infarcts.  There is moderate generalized atrophy and changes of small vessel disease.  CT angiogram shows severe right greater than left paraclinoid ICA stenosis with hypoplastic posterior circulation.  Echocardiogram is pending Vitals:   11/07/20 0500 11/07/20 0515 11/07/20 0710 11/07/20 1002  BP: (!) 161/83  (!) 147/85 (!) 158/74  Pulse: (!) 111 95 98   Resp: 14 17 19    Temp:   98 F (36.7 C)   TempSrc:   Oral   SpO2: 98% 94% 94%    CBC:  Recent Labs  Lab 11/06/20 0524 11/06/20 1340 11/07/20 0745  WBC 10.7* 9.1 8.0  NEUTROABS 8.9*  --  5.2  HGB 12.8 11.6* 11.8*  HCT 39.7 36.9 36.9  MCV 98.0 99.7 97.4  PLT 151 148* 852*   Basic Metabolic Panel:  Recent Labs  Lab 11/06/20 0524 11/06/20 1340 11/07/20 0745  NA 136  --  140  K 4.3  --  3.5  CL 102  --  108  CO2 25  --  26  GLUCOSE 282*  --  80  BUN 26*  --  13  CREATININE 1.06* 0.97 0.91  CALCIUM 9.3  --  9.4            Lipid Panel:  Recent Labs  Lab 11/07/20 0548  CHOL 201*  TRIG 72  HDL 65  CHOLHDL 3.1  VLDL 14  LDLCALC 122*      HgbA1c: No results for input(s): HGBA1C in the last 168 hours. Urine Drug Screen: No results for input(s): LABOPIA, COCAINSCRNUR, LABBENZ, AMPHETMU, THCU, LABBARB in the last 168 hours.   Alcohol Level No results for input(s): ETH in the last 168 hours.  IMAGING past 24 hours CT ANGIO HEAD NECK W WO CM  Result Date: 11/06/2020 CLINICAL DATA:  Ataxia, stroke suspected EXAM: CT ANGIOGRAPHY HEAD AND NECK TECHNIQUE: Multidetector CT imaging of the head and neck was performed using the standard protocol during bolus administration of intravenous contrast. Multiplanar CT image reconstructions and MIPs were obtained to evaluate the vascular anatomy. Carotid stenosis measurements (when applicable) are obtained utilizing NASCET criteria, using the distal internal carotid diameter as the denominator. CONTRAST:  60mL OMNIPAQUE IOHEXOL 350 MG/ML SOLN COMPARISON:  CT Head 11/05/2020.  Same day MRI. FINDINGS: CT HEAD FINDINGS Brain: Known acute infarcts in the cerebellar vermis and left cerebellar hemisphere are not well characterized by CT. No acute hemorrhage or mass effect. Redemonstrated moderate to advanced chronic microvascular ischemic disease and atrophy with disproportionate right hippocampal volume loss. No hydrocephalus, midline shift, mass lesion, or extra-axial fluid collection. Vascular: See below. Skull: No acute fracture. Sinuses: Visualized sinuses are clear. Orbits: No acute findings. Review of the MIP images confirms the above findings CTA NECK FINDINGS Aortic arch: Great vessel origins are patent.  Atherosclerosis. Right carotid system:  Atherosclerosis of the common carotid artery with approximately 40% stenosis distally. Mixed calcific and noncalcific atherosclerosis at the carotid bifurcation without greater than 50% stenosis relative to the distal vessel. Retropharyngeal course. Left carotid system: Atherosclerosis at the carotid bifurcation without greater than 50% stenosis relative to the distal vessel. Vertebral arteries: Right dominant. The left vertebral artery is small throughout its course. No evidence of significant (greater than 50%) stenosis. Skeleton: Moderate multilevel  degenerative disc disease. Other neck: No acute abnormality. Upper chest: Mild dependent ground-glass opacities, likely atelectasis. Otherwise, clear visualized lung apices. Review of the MIP images confirms the above findings CTA HEAD FINDINGS Anterior circulation: Bilateral intracranial ICA calcific atherosclerosis with severe greater than left paraclinoid ICA stenosis. Bilateral M1 MCAs are small but patent. No proximal M2 MCA occlusion. Small or absent right A1 ACA, likely congenital. Otherwise, patent ACAs. Posterior circulation: Small/non dominant left vertebral artery appears to largely terminate as PICA. Mild atherosclerotic narrowing of the dominant right intradural vertebral artery. The basilar artery is small with superimposed moderate atherosclerotic narrowing along its midportion. Both superior cerebellar arteries are small with nonvisualization of the mid and distal left superior cerebellar artery, likely stenotic or occluded. Fetal type PCA on the right with severe right P2 PCA stenosis. Venous sinuses: As permitted by contrast timing, patent. Anatomic variants: As detailed above Review of the MIP images confirms the above findings IMPRESSION: CT head: 1. Known acute infarcts in the cerebellar vermis and left cerebellar hemisphere are not well characterized by CT. No acute hemorrhage or mass effect. 2. Redemonstrated moderate to advanced chronic microvascular ischemic disease and atrophy with disproportionate right hippocampal volume loss. CTA head: 1. Severe right greater than left paraclinoid ICA stenosis. 2. The superior cerebellar arteries are difficult to assess due to their small size with nonvisualization of the mid and distal left superior cerebellar artery, likely stenotic or occluded. 3. Small vertebrobasilar system with superimposed moderate narrowing of the mid basilar artery and severe right P2 PCA stenosis. CTA neck: 1. Approximately 40% stenosis of the distal right common carotid  artery. 2. Bilateral carotid bifurcation atherosclerosis without greater than 50% stenosis. Electronically Signed   By: Margaretha Sheffield M.D.   On: 11/06/2020 13:27   ECHOCARDIOGRAM COMPLETE  Result Date: 11/07/2020    ECHOCARDIOGRAM REPORT   Patient Name:   Stacey Fuentes Date of Exam: 11/07/2020 Medical Rec #:  [id number]      Height:       62.0 in Accession #:    [id number]     Weight:       160.0 lb Date of Birth:  May 01, 1935      BSA:          1.739 m Patient Age:    64 years       BP:           147/85 mmHg Patient Gender: F              HR:           95 bpm. Exam Location:  Inpatient Procedure: 2D Echo, Cardiac Doppler and Color Doppler Indications:    Stroke I63.9  History:        Patient has prior history of Echocardiogram examinations, most                 recent 06/17/2017.  Sonographer:    Merrie Roof RDCS Referring Phys: [id number] [place]  1. Left ventricular ejection fraction, by estimation, is 60 to 65%. The left ventricle  has normal function. The left ventricle has no regional wall motion abnormalities. There is moderate left ventricular hypertrophy. Left ventricular diastolic parameters are consistent with Grade I diastolic dysfunction (impaired relaxation).  2. Right ventricular systolic function is normal. The right ventricular size is normal.  3. The mitral valve is abnormal. Trivial mitral valve regurgitation. No evidence of mitral stenosis.  4. The aortic valve is tricuspid. There is mild calcification of the aortic valve. Aortic valve regurgitation is not visualized. Mild aortic valve sclerosis is present, with no evidence of aortic valve stenosis.  5. The inferior vena cava is normal in size with greater than 50% respiratory variability, suggesting right atrial pressure of 3 mmHg. FINDINGS  Left Ventricle: Left ventricular ejection fraction, by estimation, is 60 to 65%. The left ventricle has normal function. The left ventricle has no regional wall motion abnormalities. The  left ventricular internal cavity size was normal in size. There is  moderate left ventricular hypertrophy. Left ventricular diastolic parameters are consistent with Grade I diastolic dysfunction (impaired relaxation). Right Ventricle: The right ventricular size is normal. No increase in right ventricular wall thickness. Right ventricular systolic function is normal. Left Atrium: Left atrial size was normal in size. Right Atrium: Right atrial size was normal in size. Pericardium: There is no evidence of pericardial effusion. Mitral Valve: The mitral valve is abnormal. There is mild thickening of the mitral valve leaflet(s). There is mild calcification of the mitral valve leaflet(s). Mild mitral annular calcification. Trivial mitral valve regurgitation. No evidence of mitral valve stenosis. Tricuspid Valve: The tricuspid valve is normal in structure. Tricuspid valve regurgitation is not demonstrated. No evidence of tricuspid stenosis. Aortic Valve: The aortic valve is tricuspid. There is mild calcification of the aortic valve. Aortic valve regurgitation is not visualized. Mild aortic valve sclerosis is present, with no evidence of aortic valve stenosis. Aortic valve mean gradient measures 5.0 mmHg. Aortic valve peak gradient measures 7.4 mmHg. Aortic valve area, by VTI measures 2.56 cm. Pulmonic Valve: The pulmonic valve was normal in structure. Pulmonic valve regurgitation is not visualized. No evidence of pulmonic stenosis. Aorta: The aortic root is normal in size and structure. Venous: The inferior vena cava is normal in size with greater than 50% respiratory variability, suggesting right atrial pressure of 3 mmHg. IAS/Shunts: No atrial level shunt detected by color flow Doppler.  LEFT VENTRICLE PLAX 2D LVIDd:         3.50 cm  Diastology LVIDs:         2.30 cm  LV e' medial:    4.90 cm/s LV PW:         1.30 cm  LV E/e' medial:  15.1 LV IVS:        1.40 cm  LV e' lateral:   6.31 cm/s LVOT diam:     2.00 cm  LV E/e'  lateral: 11.7 LV SV:         69 LV SV Index:   40 LVOT Area:     3.14 cm  RIGHT VENTRICLE RV Basal diam:  2.80 cm LEFT ATRIUM             Index       RIGHT ATRIUM           Index LA diam:        3.20 cm 1.84 cm/m  RA Area:     14.30 cm LA Vol (A2C):   62.1 ml 35.72 ml/m RA Volume:   34.20 ml  19.67 ml/m LA  Vol (A4C):   45.7 ml 26.28 ml/m LA Biplane Vol: 56.0 ml 32.21 ml/m  AORTIC VALVE AV Area (Vmax):    2.66 cm AV Area (Vmean):   2.44 cm AV Area (VTI):     2.56 cm AV Vmax:           136.00 cm/s AV Vmean:          102.000 cm/s AV VTI:            0.271 m AV Peak Grad:      7.4 mmHg AV Mean Grad:      5.0 mmHg LVOT Vmax:         115.00 cm/s LVOT Vmean:        79.300 cm/s LVOT VTI:          0.221 m LVOT/AV VTI ratio: 0.82  AORTA Ao Root diam: 3.50 cm Ao Asc diam:  3.40 cm MITRAL VALVE MV Area (PHT): 4.06 cm     SHUNTS MV Decel Time: 187 msec     Systemic VTI:  0.22 m MV E velocity: 74.10 cm/s   Systemic Diam: 2.00 cm MV A velocity: 114.00 cm/s MV E/A ratio:  0.65 Ensey Rouge MD Electronically signed by Ahner Rouge MD Signature Date/Time: 11/07/2020/9:45:48 AM    Final     PHYSICAL EXAM Frail elderly African-American lady not in distress. . Afebrile. Head is nontraumatic. Neck is supple without bruit.    Cardiac exam no murmur or gallop. Lungs are clear to auscultation. Distal pulses are well felt.  Neurological Exam Patient is awake alert disoriented to time place and person.  Diminished attention, registration and recall.  She can follow only simple midline one-step commands.  Speech is clear without dysarthria.  Extraocular movements are full range without nystagmus.  Blinks to threat bilaterally.  Face symmetric without weakness.  Tongue midline.  Motor system exam she is able to move all 4 extremities well against gravity.  Is not very cooperative for detailed muscle testing.  No focal weakness.  He can reflexes symmetric.  Plantars downgoing.  Sensation appears preserved.  Gait not  tested. ASSESSMENT/PLAN Stacey Fuentes is a 85 y.o. 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 Per daughter patient has had recent falls, AMS, word finding difficulty, staring spell and increased lethargy.  During the night prior to admission patient had nausea and vomiting with inability to walk with same speech problems. Patient fell into the wall and sat down. Daughter was unable to move patient, stating she was "dead weight". Daughter called 18 and patient was brought to the ED for further evaluation. Neurology was consulted due to MRI findings.    Stroke: Small early subacute infarcts in the cerebellar vermis and left cerebellar hemisphere in the SCA distribution which may be embolic or atherosclerotic  CT head, initial No acute abnormality.Chronic atrophy and periventricular white matter microvascular disease. CT head Known acute infarcts in the cerebellar vermis and left cerebellar hemisphere are not well characterized by CT. No acute hemorrhage or mass effect. Re-demonstrated moderate to advanced chronic microvascular ischemic disease and atrophy with disproportionate right hippocampal volume loss. MRI   Small early subacute infarcts in the cerebellar vermis and left cerebellar hemisphere in the SCA distribution. Global parenchymal volume loss with disproportionate severe right hippocampal atrophy. Advanced chronic white matter microangiopathy  CTA Head Severe right greater than left paraclinoid ICA  stenosis. The superior cerebellar arteries are difficult to assess due to their small size with nonvisualization of the mid and distal left superior cerebellar artery, likely stenotic or occluded. Small vertebrobasilar system with superimposed moderate narrowing of the mid basilar artery and severe right P2 PCA stenosis.  CTA  Neck: Approximately 40% stenosis of the distal right common carotid artery. Bilateral carotid bifurcation atherosclerosis without greater than 50% stenosis. 2D Echo  EF 60-65%, +LVH, No thrombus, wall motion abnormality or shunt found.   EEG PENDING LDL 122 HgbA1c PENDING VTE prophylaxis - per primary team, recommended    Diet   Diet heart healthy/carb modified Room service appropriate? Yes; Fluid consistency: Thin   Not taking ASA 81mg  as prescribed at home because they thought it was causing constipation. DAPT with ASA 81mg  and Plavix 75mg  then ASA alone as monotherapy Therapy recommendations:  Pending progress may need SNF vs return home  Disposition:  TBD  Hypertension Stable Permissive hypertension (OK if < 220/120) but gradually normalize in 5-7 days Long-term BP goal normotensive  Hyperlipidemia Home meds: None LDL 122, goal < 70 High intensity statin: Lipitor 40mg  on board  Continue statin at discharge  Diabetes type II  HgbA1c PENDING goal < 7.0 No recent labs on file  CBGs Recent Labs    11/06/20 1655 11/06/20 1807 11/06/20 2121  GLUCAP 62* 130* 113*    Management per primary team       Dementia Losing function steadily since 2017 Now with hallucinations in past several months Daughter reports taken off Namenda after one week trial May need longer trial once past initial stroke work up  Other Stroke Risk Factors Advanced Age >/= 38  Obesity, There is no height or weight on file to calculate BMI., BMI >/= 30 associated with increased stroke risk, recommend weight loss, diet and exercise as appropriate   Other Active Problems   Hospital day # 1  This plan of care was directed by Dr. Philis Nettle, NP-C  Stroke MD Note :  I have personally obtained history,examined this patient, reviewed notes, independently viewed imaging studies, participated in medical decision making and plan of care.ROS completed by me personally and pertinent  positives fully documented  I have made any additions or clarifications directly to the above note. Agree with note above.  Patient presented with sudden onset of difficulty walking and loss of balance due to small cerebellar infarcts.  She has baseline Lewy body dementia and may not be of good candidate for long-term anticoagulation hence will not pursue long-term cardiac monitoring.  Long discussion with the patient and daughter at the bedside and answered questions.  Continue ongoing stroke work-up.  Recommend aspirin and Plavix for 3 weeks followed by aspirin alone.  Patient may consider outpatient trial of Namenda which she has not taken for long enough in the past to call her treatment failure and follow-up with neurologist Dr. Leta Baptist whom she has seen before..  Discussed with Dr. Verlon Au.  Greater than 50% time during this 35-minute visit was spent on counseling and coordination of care about her stroke and dementia and discussion with care team and answering questions with  Antony Contras, MD Medical Director [place] Pager: [phone] 11/07/2020 5:32 PM  To contact Stroke Continuity provider, please refer to [email or web address] After hours, contact General Neurology

## 2020-11-07 NOTE — Progress Notes (Signed)
PROGRESS NOTE   Stacey Fuentes  BBC:[id number] DOB: Apr 18, 1935 DOA: 11/06/2020 PCP: Harlan Stains, MD  Brief Narrative:   85 year old black female lives with daughter [name]---ambulatory with cane, dementia since 2017 followed by Dr. Leta Baptist, diabetes mellitus since 1991 followed by Dr. Chalmers Cater, orthostatic hypotension prior seen Dr. Ellyn Hack, CKD 2, benign essential tremors Brought to ED 9/27 increasing lethargy, increased tremors more wobbly and fell backwards into a chair-initially went to [facility]-left because not seen came back to [facility], ED Imaging obtained showing early patchy left SCA stroke-neurology consulted   Hospital-Problem based course  Left SCA stroke -Defer planning to neurology continue aspirin 81 Plavix 75, given 325 on admission continue Lipitor 40 daily - Await further work-up with echocardiogram -Continue saline 75 cc/8  DM TY 2 complicated by nephropathy - Start Lantus 15 units home dose is 30 apparently not on short-acting insulin -Continue to adjust insulins as sugars in the 200s to 300s  Delirium/dementia since 2017 -Will need to be removed from mittens when able-patient is quite impulsive and was given Haldol overnight 9/27 -Reorient as able -Continue melatonin 10 hs  Hypertension - Continue amlodipine 5 mg daily   DVT prophylaxis: lovenox Code Status: FULL code Family Communication: [name] [phone] called and updated Disposition:  Status is: Inpatient  Remains inpatient appropriate because:Persistent severe electrolyte disturbances, Altered mental status, and IV treatments appropriate due to intensity of illness or inability to take PO  Dispo: The patient is from: Home              Anticipated d/c is to: SNF              Patient currently is not medically stable to d/c.   Difficult to place patient No   Consultants:  Neurology  Procedures:   Antimicrobials:     Subjective: Confused-cannot tell me time place  person Daughter relates 1 week prior to coming to the hospital she was completely coherent able to do simple things on her own able to mobilize and this is a big change I am unable to get a complete review of systems  Objective: Vitals:   11/07/20 0100 11/07/20 0345 11/07/20 0500 11/07/20 0515  BP: (!) 150/79 (!) 173/91 (!) 161/83   Pulse: 100 (!) 105 (!) 111 95  Resp: 15 15 14 17   Temp:      TempSrc:      SpO2: 98% 97% 98% 94%    Intake/Output Summary (Last 24 hours) at 11/07/2020 0726 Last data filed at 11/06/2020 0900 Gross per 24 hour  Intake 999 ml  Output --  Net 999 ml   There were no vitals filed for this visit.  Examination:  Confused incoherent cannot tell me time place person Vision by direct confrontation is intact Answer simple questions CTA B no added sound no rales no rhonchi S1-S2 tachycardic no murmur no rub no gallop Reflexes are equivocal poor exam she is a little tremulous She is able to raise her arms above her head Tongue protrudes midline Shoulder shrug is okay  Data Reviewed: personally reviewed   CBC    Component Value Date/Time   WBC 9.1 11/06/2020 1340   RBC 3.70 (L) 11/06/2020 1340   HGB 11.6 (L) 11/06/2020 1340   HCT 36.9 11/06/2020 1340   PLT 148 (L) 11/06/2020 1340   MCV 99.7 11/06/2020 1340   MCH 31.4 11/06/2020 1340   MCHC 31.4 11/06/2020 1340   RDW 12.0 11/06/2020 1340   LYMPHSABS 1.0 11/06/2020 0524  MONOABS 0.6 11/06/2020 0524   EOSABS 0.0 11/06/2020 0524   BASOSABS 0.0 11/06/2020 0524   CMP Latest Ref Rng & Units 11/06/2020 11/06/2020 11/05/2020  Glucose 70 - 99 mg/dL - 282(H) 369(H)  BUN 8 - 23 mg/dL - 26(H) 27(H)  Creatinine 0.44 - 1.00 mg/dL 0.97 1.06(H) 0.94  Sodium 135 - 145 mmol/L - 136 135  Potassium 3.5 - 5.1 mmol/L - 4.3 4.9  Chloride 98 - 111 mmol/L - 102 96(L)  CO2 22 - 32 mmol/L - 25 26  Calcium 8.9 - 10.3 mg/dL - 9.3 9.6  Total Protein 6.5 - 8.1 g/dL - 6.5 7.3  Total Bilirubin 0.3 - 1.2 mg/dL - 0.7 0.8   Alkaline Phos 38 - 126 U/L - 49 58  AST 15 - 41 U/L - 18 21  ALT 0 - 44 U/L - 14 14     Radiology Studies: CT ANGIO HEAD NECK W WO CM  Result Date: 11/06/2020 CLINICAL DATA:  Ataxia, stroke suspected EXAM: CT ANGIOGRAPHY HEAD AND NECK TECHNIQUE: Multidetector CT imaging of the head and neck was performed using the standard protocol during bolus administration of intravenous contrast. Multiplanar CT image reconstructions and MIPs were obtained to evaluate the vascular anatomy. Carotid stenosis measurements (when applicable) are obtained utilizing NASCET criteria, using the distal internal carotid diameter as the denominator. CONTRAST:  17mL OMNIPAQUE IOHEXOL 350 MG/ML SOLN COMPARISON:  CT Head 11/05/2020.  Same day MRI. FINDINGS: CT HEAD FINDINGS Brain: Known acute infarcts in the cerebellar vermis and left cerebellar hemisphere are not well characterized by CT. No acute hemorrhage or mass effect. Redemonstrated moderate to advanced chronic microvascular ischemic disease and atrophy with disproportionate right hippocampal volume loss. No hydrocephalus, midline shift, mass lesion, or extra-axial fluid collection. Vascular: See below. Skull: No acute fracture. Sinuses: Visualized sinuses are clear. Orbits: No acute findings. Review of the MIP images confirms the above findings CTA NECK FINDINGS Aortic arch: Great vessel origins are patent.  Atherosclerosis. Right carotid system: Atherosclerosis of the common carotid artery with approximately 40% stenosis distally. Mixed calcific and noncalcific atherosclerosis at the carotid bifurcation without greater than 50% stenosis relative to the distal vessel. Retropharyngeal course. Left carotid system: Atherosclerosis at the carotid bifurcation without greater than 50% stenosis relative to the distal vessel. Vertebral arteries: Right dominant. The left vertebral artery is small throughout its course. No evidence of significant (greater than 50%) stenosis. Skeleton:  Moderate multilevel degenerative disc disease. Other neck: No acute abnormality. Upper chest: Mild dependent ground-glass opacities, likely atelectasis. Otherwise, clear visualized lung apices. Review of the MIP images confirms the above findings CTA HEAD FINDINGS Anterior circulation: Bilateral intracranial ICA calcific atherosclerosis with severe greater than left paraclinoid ICA stenosis. Bilateral M1 MCAs are small but patent. No proximal M2 MCA occlusion. Small or absent right A1 ACA, likely congenital. Otherwise, patent ACAs. Posterior circulation: Small/non dominant left vertebral artery appears to largely terminate as PICA. Mild atherosclerotic narrowing of the dominant right intradural vertebral artery. The basilar artery is small with superimposed moderate atherosclerotic narrowing along its midportion. Both superior cerebellar arteries are small with nonvisualization of the mid and distal left superior cerebellar artery, likely stenotic or occluded. Fetal type PCA on the right with severe right P2 PCA stenosis. Venous sinuses: As permitted by contrast timing, patent. Anatomic variants: As detailed above Review of the MIP images confirms the above findings IMPRESSION: CT head: 1. Known acute infarcts in the cerebellar vermis and left cerebellar hemisphere are not well characterized by CT. No  acute hemorrhage or mass effect. 2. Redemonstrated moderate to advanced chronic microvascular ischemic disease and atrophy with disproportionate right hippocampal volume loss. CTA head: 1. Severe right greater than left paraclinoid ICA stenosis. 2. The superior cerebellar arteries are difficult to assess due to their small size with nonvisualization of the mid and distal left superior cerebellar artery, likely stenotic or occluded. 3. Small vertebrobasilar system with superimposed moderate narrowing of the mid basilar artery and severe right P2 PCA stenosis. CTA neck: 1. Approximately 40% stenosis of the distal right  common carotid artery. 2. Bilateral carotid bifurcation atherosclerosis without greater than 50% stenosis. Electronically Signed   By: Margaretha Sheffield M.D.   On: 11/06/2020 13:27   DG Chest 2 View  Result Date: 11/05/2020 CLINICAL DATA:  Weakness, fall EXAM: CHEST - 2 VIEW COMPARISON:  09/25/2017 FINDINGS: Lungs are clear.  No pleural effusion or pneumothorax. The heart is normal in size. Mild degenerative changes of the visualized thoracolumbar spine. IMPRESSION: Normal chest radiographs. Electronically Signed   By: Julian Hy M.D.   On: 11/05/2020 19:32   CT HEAD WO CONTRAST (5MM)  Result Date: 11/05/2020 CLINICAL DATA:  Mental status change. EXAM: CT HEAD WITHOUT CONTRAST TECHNIQUE: Contiguous axial images were obtained from the base of the skull through the vertex without intravenous contrast. COMPARISON:  None. FINDINGS: Brain: No acute intracranial hemorrhage. No focal mass lesion. No CT evidence of acute infarction. No midline shift or mass effect. No hydrocephalus. Basilar cisterns are patent. There are periventricular and subcortical white matter hypodensities. Generalized cortical atrophy. Vascular: No hyperdense vessel or unexpected calcification. Skull: Normal. Negative for fracture or focal lesion. Sinuses/Orbits: Paranasal sinuses and mastoid air cells are clear. Orbits are clear. Other: None. IMPRESSION: 1. No acute intracranial findings. 2. Chronic atrophy and periventricular white matter microvascular disease. Electronically Signed   By: Suzy Bouchard M.D.   On: 11/05/2020 19:33   MR BRAIN WO CONTRAST  Result Date: 11/06/2020 CLINICAL DATA:  Increased lethargy, reported fall EXAM: MRI HEAD WITHOUT CONTRAST TECHNIQUE: Multiplanar, multiecho pulse sequences of the brain and surrounding structures were obtained without intravenous contrast. COMPARISON:  Noncontrast CT head obtained 1 day prior FINDINGS: Brain: There are small acute to early subacute infarcts in the cerebellar  vermis and left cerebellar hemisphere in the SCA distribution. There is no associated hemorrhage. There is no other acute infarct. There is no extra-axial fluid collection. There is moderate global parenchymal volume loss with disproportionate severe right hippocampal atrophy. There is moderate left hippocampal atrophy. There is commensurate ex vacuo dilatation of the ventricular system. There is extensive confluent FLAIR signal abnormality throughout the subcortical and periventricular white matter likely reflecting sequela of advanced chronic white matter microangiopathy. There is no mass lesion.  There is no midline shift. Vascular: Normal flow voids. Skull and upper cervical spine: Normal marrow signal. Sinuses/Orbits: The imaged paranasal sinuses are clear. Bilateral lens implants are in place. The globes and orbits are otherwise unremarkable. Other: None. IMPRESSION: 1. Small early subacute infarcts in the cerebellar vermis and left cerebellar hemisphere in the SCA distribution. 2. Global parenchymal volume loss with disproportionate severe right hippocampal atrophy. 3. Advanced chronic white matter microangiopathy. Electronically Signed   By: Valetta Mole M.D.   On: 11/06/2020 09:34     Scheduled Meds:   stroke: mapping our early stages of recovery book   Does not apply Once   amLODipine  5 mg Oral Daily   aspirin EC  81 mg Oral Daily   atorvastatin  40 mg Oral Daily   calcium carbonate  1 tablet Oral BID WC   cholecalciferol  1,000 Units Oral Daily   clopidogrel  75 mg Oral Daily   enoxaparin (LOVENOX) injection  40 mg Subcutaneous Q24H   insulin aspart  0-5 Units Subcutaneous QHS   insulin aspart  0-9 Units Subcutaneous TID WC   insulin glargine  15 Units Subcutaneous Daily   latanoprost  1 drop Both Eyes QPM   omega-3 acid ethyl esters  1 g Oral Daily   pantoprazole  40 mg Oral Daily   vitamin B-12  1,000 mcg Oral Daily   Continuous Infusions:  sodium chloride 50 mL/hr at 11/07/20 0521      LOS: 1 day   Time spent: [place], MD [facility] To contact the attending provider between 7A-7P or the covering provider during after hours 7P-7A, please log into the web site [email or web address] and access using universal [place] password for that web site. If you do not have the password, please call the hospital operator.  11/07/2020, 7:26 AM

## 2020-11-07 NOTE — Progress Notes (Signed)
Patient currently restless going toward edge of the bed, pulled off gown, found with IV pulled out and Cardiac telemetry leads off on the side of the bed.

## 2020-11-07 NOTE — Progress Notes (Signed)
Occupational Therapy Treatment Patient Details Name: Stacey Fuentes MRN: [id number] DOB: 1935/03/29 Today's Date: 11/07/2020   History of present illness Pt is an 85 y/o female admitted 9/27 secondary to worsening weakness and ambulatory function. Imaging showed infarct in cerebellar vermis and L cerebellar hemisphere. PMH includes dementia, HTN, CKD, DM, osteoporosis, and tremors.   OT comments  Pt assisted with transfer to [facility] with mod A +2 for safety.  Daughter very supportive and involved with pt care.   Recommendations for follow up therapy are one component of a multi-disciplinary discharge planning process, led by the attending physician.  Recommendations may be updated based on patient status, additional functional criteria and insurance authorization.    Follow Up Recommendations  CIR    Equipment Recommendations  3 in 1 bedside commode    Recommendations for Other Services Rehab consult    Precautions / Restrictions Precautions Precautions: Fall       Mobility Bed Mobility Overal bed mobility: Needs Assistance Bed Mobility: Supine to Sit;Sit to Supine     Supine to sit: Max assist Sit to supine: Max assist   General bed mobility comments: assisted with LEs and lifting and lowering trunk    Transfers                      Balance Overall balance assessment: Needs assistance Sitting-balance support: No upper extremity supported;Feet supported Sitting balance-Leahy Scale: Fair     Standing balance support: Bilateral upper extremity supported Standing balance-Leahy Scale: Poor Standing balance comment: Reliant on BUE support and up to mod A for static standing                           ADL either performed or assessed with clinical judgement   ADL Overall ADL's : Needs assistance/impaired                         Toilet Transfer: Moderate assistance;BSC;Stand-pivot;+2 for safety/equipment   Toileting- Clothing Manipulation  and Hygiene: Total assistance;Sit to/from stand       Functional mobility during ADLs: Moderate assistance;+2 for safety/equipment       Vision       Perception     Praxis      Cognition Arousal/Alertness: Awake/alert Behavior During Therapy: WFL for tasks assessed/performed Overall Cognitive Status: History of cognitive impairments - at baseline                                 General Comments: Dementia at baseline.        Exercises     Shoulder Instructions       General Comments      Pertinent Vitals/ Pain       Pain Assessment: No/denies pain  Home Living                                          Prior Functioning/Environment              Frequency  Min 2X/week        Progress Toward Goals  OT Goals(current goals can now be found in the care plan section)  Progress towards OT goals: Progressing toward goals     Plan Discharge plan remains appropriate  Co-evaluation                 AM-PAC OT "6 Clicks" Daily Activity     Outcome Measure   Help from another person eating meals?: A Little Help from another person taking care of personal grooming?: A Lot Help from another person toileting, which includes using toliet, bedpan, or urinal?: A Lot Help from another person bathing (including washing, rinsing, drying)?: A Lot Help from another person to put on and taking off regular upper body clothing?: A Lot Help from another person to put on and taking off regular lower body clothing?: Total 6 Click Score: 12    End of Session    OT Visit Diagnosis: Unsteadiness on feet (R26.81);Cognitive communication deficit (R41.841) Symptoms and signs involving cognitive functions: Cerebral infarction   Activity Tolerance Patient tolerated treatment well   Patient Left in bed;with call bell/phone within reach;with restraints reapplied;with family/visitor present;with nursing/sitter in room   Nurse  Communication Mobility status        Time: 3903-0092 OT Time Calculation (min): 14 min  Charges: OT General Charges $OT Visit: 1 Visit OT Treatments $Self Care/Home Management : 8-22 mins  Nilsa Nutting OTR/L Acute Rehabilitation Services Pager [phone] Office [phone]   Lucille Passy M 11/07/2020, 5:06 PM

## 2020-11-08 ENCOUNTER — Inpatient Hospital Stay (HOSPITAL_COMMUNITY): Payer: Medicare Other

## 2020-11-08 ENCOUNTER — Other Ambulatory Visit (HOSPITAL_COMMUNITY): Payer: Medicare Other

## 2020-11-08 DIAGNOSIS — R4182 Altered mental status, unspecified: Secondary | ICD-10-CM

## 2020-11-08 DIAGNOSIS — G3183 Dementia with Lewy bodies: Secondary | ICD-10-CM

## 2020-11-08 DIAGNOSIS — I639 Cerebral infarction, unspecified: Secondary | ICD-10-CM | POA: Diagnosis not present

## 2020-11-08 DIAGNOSIS — F0280 Dementia in other diseases classified elsewhere without behavioral disturbance: Secondary | ICD-10-CM | POA: Diagnosis not present

## 2020-11-08 LAB — CBC WITH DIFFERENTIAL/PLATELET
Abs Immature Granulocytes: 0.03 10*3/uL (ref 0.00–0.07)
Basophils Absolute: 0 10*3/uL (ref 0.0–0.1)
Basophils Relative: 1 %
Eosinophils Absolute: 0.3 10*3/uL (ref 0.0–0.5)
Eosinophils Relative: 3 %
HCT: 37.8 % (ref 36.0–46.0)
Hemoglobin: 12.1 g/dL (ref 12.0–15.0)
Immature Granulocytes: 0 %
Lymphocytes Relative: 25 %
Lymphs Abs: 2 10*3/uL (ref 0.7–4.0)
MCH: 30.6 pg (ref 26.0–34.0)
MCHC: 32 g/dL (ref 30.0–36.0)
MCV: 95.7 fL (ref 80.0–100.0)
Monocytes Absolute: 0.7 10*3/uL (ref 0.1–1.0)
Monocytes Relative: 8 %
Neutro Abs: 5.1 10*3/uL (ref 1.7–7.7)
Neutrophils Relative %: 63 %
Platelets: 158 10*3/uL (ref 150–400)
RBC: 3.95 MIL/uL (ref 3.87–5.11)
RDW: 12 % (ref 11.5–15.5)
WBC: 8.1 10*3/uL (ref 4.0–10.5)
nRBC: 0 % (ref 0.0–0.2)

## 2020-11-08 LAB — GLUCOSE, CAPILLARY
Glucose-Capillary: 180 mg/dL — ABNORMAL HIGH (ref 70–99)
Glucose-Capillary: 139 mg/dL — ABNORMAL HIGH (ref 70–99)
Glucose-Capillary: 156 mg/dL — ABNORMAL HIGH (ref 70–99)
Glucose-Capillary: 125 mg/dL — ABNORMAL HIGH (ref 70–99)

## 2020-11-08 LAB — BASIC METABOLIC PANEL
Anion gap: 8 (ref 5–15)
BUN: 14 mg/dL (ref 8–23)
CO2: 26 mmol/L (ref 22–32)
Calcium: 9.6 mg/dL (ref 8.9–10.3)
Chloride: 104 mmol/L (ref 98–111)
Creatinine, Ser: 1.04 mg/dL — ABNORMAL HIGH (ref 0.44–1.00)
GFR, Estimated: 54 mL/min — ABNORMAL LOW (ref >60)
Glucose, Bld: 180 mg/dL — ABNORMAL HIGH (ref 70–99)
Potassium: 3.5 mmol/L (ref 3.5–5.1)
Sodium: 138 mmol/L (ref 135–145)

## 2020-11-08 MED ORDER — METOPROLOL TARTRATE 12.5 MG HALF TABLET
12.5000 mg | ORAL_TABLET | Freq: Two times a day (BID) | ORAL | Status: DC
  Administered 2020-11-08: 12.5 mg via ORAL
  Filled 2020-11-08: qty 1

## 2020-11-08 NOTE — Procedures (Signed)
Patient Name: Stacey Fuentes  MRN: [id number]  Epilepsy Attending: Lora Havens  Referring Physician/Provider: Hetty Blend, NP Date: 11/08/2020 Duration: 21.49 mins  Patient history: 85 y.o. 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 Per daughter patient has had recent falls, AMS, word finding difficulty, staring spell and increased lethargy. EEG to evaluate for seizure.   Level of alertness: Awake  AEDs during EEG study: None  Technical aspects: This EEG study was done with scalp electrodes positioned according to the 10-20 International system of electrode placement. Electrical activity was acquired at a sampling rate of 500Hz  and reviewed with a high frequency filter of 70Hz  and a low frequency filter of 1Hz . EEG data were recorded continuously and digitally stored.   Description: The posterior dominant rhythm consists of 7.5 Hz activity of moderate voltage (25-35 uV) seen predominantly in posterior head regions, symmetric and reactive to eye opening and eye closing. EEG showed continuous generalized 5 to 6 Hz theta as well as intermittent 2-3Hz  delta slowing. Hyperventilation and photic stimulation were not performed.     ABNORMALITY - Continuous slow, generalized  IMPRESSION: This study is suggestive of mild to moderate diffuse encephalopathy, nonspecific etiology. No seizures or epileptiform discharges were seen throughout the recording.  Brooke Payes Barbra Sarks

## 2020-11-08 NOTE — Progress Notes (Signed)
EEG complete - results pending 

## 2020-11-08 NOTE — Evaluation (Signed)
Speech Language Pathology Evaluation Patient Details Name: Stacey Fuentes MRN: [id number] DOB: 1935/06/25 Today's Date: 11/08/2020 Time: 6387-5643 SLP Time Calculation (min) (ACUTE ONLY): 15 min  Problem List:  Patient Active Problem List   Diagnosis Date Noted   Cerebellar infarct ([place]) 11/06/2020   Dementia without behavioral disturbance ([place]) 11/06/2020   Type 2 diabetes mellitus with other specified complication ([place]) 32/95/1884   CKD (chronic kidney disease), stage III ([place]) 11/06/2020   Essential hypertension 07/12/2017   Orthostatic syncope 06/11/2017   Aortic ejection murmur 06/11/2017   Uncontrolled type 2 diabetes mellitus with nephropathy ([place]) 03/24/2014   Upper abdominal pain 03/23/2014   Nausea 03/23/2014   Abnormal findings on radiological examination of gastrointestinal tract 03/23/2014   Infected sebaceous cyst 04/15/2013   Past Medical History:  Past Medical History:  Diagnosis Date   Benign essential tremor    Cataract    CKD (chronic kidney disease), stage II    Diabetes mellitus ([place])    type2   Full dentures    Hyperlipidemia    Memory difficulty    Osteoporosis    Uncontrolled diabetes mellitus type 2 without complications 1/66/0630   Vitamin D deficiency    Wears glasses    Past Surgical History:  Past Surgical History:  Procedure Laterality Date   APPENDECTOMY     childhood   CATARACT EXTRACTION, BILATERAL  2014   COLONOSCOPY     in [place] in [place] N/A 05/18/2013   Procedure: EXCISION OF SEBACEOUS CYST BACK;  Surgeon: Merrie Roof, MD;  Location: [facility];  Service: General;  Laterality: N/A;   surgery to remove cyst on back  2010   x 2   TRANSTHORACIC ECHOCARDIOGRAM  06/17/2017    EF 60-65%.  Normal wall motion.  GR 1 DD.  No obvious valvular lesions.   TUBAL LIGATION     HPI:  Pt is an 85 y/o female admitted 9/27 secondary to worsening weakness and ambulatory function. Imaging showed  infarct in cerebellar vermis and L cerebellar hemisphere. PMH includes dementia, HTN, CKD, DM, osteoporosis, and tremors.   Assessment / Plan / Recommendation Clinical Impression  Pt presents with moderate to severe cognitive deficits (suspected to be at baseline per chart review including interactions with other disciplines: PT/OT, MD). Pts daughter not present during SLP assessment. Pt with hx of dementia, per Neurology has had cognitive workup with moderate deficits noted 1 year prior, likely now has Lewy Body Dementia. Standardized cognitive assessment requested per neuro this date for family education. [employer] Mental Status (SLUMS) 0/30. Global cognitive exhibited in attention, recall, problem solving, executive function. No motor speech skills or receptive expressive language deficits appreciated. Given advanced dementia suspected to be at baseline no further cognitive intervention indicated at this time.    SLP Assessment  SLP Recommendation/Assessment: Patient does not need any further [facility] SLP Visit Diagnosis: Cognitive communication deficit (R41.841)    Recommendations for follow up therapy are one component of a multi-disciplinary discharge planning process, led by the attending physician.  Recommendations may be updated based on patient status, additional functional criteria and insurance authorization.    Follow Up Recommendations  24 hour supervision/assistance    Frequency and Duration           SLP Evaluation Cognition  Overall Cognitive Status: History of cognitive impairments - at baseline Arousal/Alertness: Awake/alert Orientation Level: Oriented to person;Oriented to place Awareness: Impaired Problem Solving: Impaired Problem  Solving Impairment: Verbal complex;Functional basic;Verbal basic;Functional complex Executive Function: Organizing;Sequencing Sequencing: Impaired Sequencing Impairment: Verbal complex;Verbal  basic;Functional basic;Functional complex Organizing: Impaired Safety/Judgment: Impaired       Comprehension  Auditory Comprehension Overall Auditory Comprehension: Appears within functional limits for tasks assessed    Expression Expression Primary Mode of Expression: Verbal Verbal Expression Overall Verbal Expression: Appears within functional limits for tasks assessed Written Expression Dominant Hand: Right   Oral / Motor  Oral Motor/Sensory Function Overall Oral Motor/Sensory Function: Within functional limits Motor Speech Overall Motor Speech: Appears within functional limits for tasks assessed   GO                    Hayden Rasmussen MA, CCC-SLP Acute Rehabilitation Services   11/08/2020, 11:45 AM

## 2020-11-08 NOTE — NC FL2 (Addendum)
[place] LEVEL OF CARE SCREENING TOOL     IDENTIFICATION  Patient Name: Stacey Fuentes Birthdate: 05/25/1935 Sex: female Admission Date (Current Location): 11/06/2020  [facility] and [place] Number:  Herbalist and Address:  [facility] [place] [address], [place], [place] [address]      Provider Number: [id number]  Attending Physician Name and Address:  [name], MD  Relative Name and Phone Number:       Current Level of Care: Hospital Recommended Level of Care: [place] Prior Approval Number:    Date Approved/Denied:   PASRR Number: [id number]  Discharge Plan: SNF    Current Diagnoses: Patient Active Problem List   Diagnosis Date Noted   Cerebellar infarct ([place]) 11/06/2020   Dementia without behavioral disturbance ([place]) 11/06/2020   Type 2 diabetes mellitus with other specified complication ([place]) 17/49/4496   CKD (chronic kidney disease), stage III ([place]) 11/06/2020   Essential hypertension 07/12/2017   Orthostatic syncope 06/11/2017   Aortic ejection murmur 06/11/2017   Uncontrolled type 2 diabetes mellitus with nephropathy ([place]) 03/24/2014   Upper abdominal pain 03/23/2014   Nausea 03/23/2014   Abnormal findings on radiological examination of gastrointestinal tract 03/23/2014   Infected sebaceous cyst 04/15/2013    Orientation RESPIRATION BLADDER Height & Weight     Self, Place  Normal Incontinent Weight:   Height:     BEHAVIORAL SYMPTOMS/MOOD NEUROLOGICAL BOWEL NUTRITION STATUS      Continent Diet (carb modified/ heart healthy with thin liquids)  AMBULATORY STATUS COMMUNICATION OF NEEDS Skin   Extensive Assist Verbally Normal                       Personal Care Assistance Level of Assistance  Bathing, Feeding, Dressing Bathing Assistance: Maximum assistance Feeding assistance: Limited assistance Dressing Assistance: Maximum assistance     Functional Limitations Info   Sight, Hearing, Speech Sight Info: Impaired Hearing Info: Adequate Speech Info: Adequate    SPECIAL CARE FACTORS FREQUENCY  PT (By licensed PT), OT (By licensed OT), Speech therapy     PT Frequency: 5x/wk OT Frequency: 5x/wk     Speech Therapy Frequency: 5x/wk      Contractures Contractures Info: Not present    Additional Factors Info  Code Status, Allergies, Insulin Sliding Scale Code Status Info: Full Allergies Info: metformin/ saxagliptin   Insulin Sliding Scale Info: Novolog 0-9 units SQ three times a day/ Novolog 0-5 units SQ at bedtime/ semglee 15 units daily       Current Medications (11/08/2020):  This is the current hospital active medication list Current Facility-Administered Medications  Medication Dose Route Frequency Provider Last Rate Last Admin    stroke: mapping our early stages of recovery book   Does not apply Once Guilford Shi, MD       0.9 %  sodium chloride infusion   Intravenous Continuous Guilford Shi, MD 50 mL/hr at 11/07/20 1850 New Bag at 11/07/20 1850   acetaminophen (TYLENOL) tablet 650 mg  650 mg Oral Q4H PRN Guilford Shi, MD   650 mg at 11/07/20 2128   Or   acetaminophen (TYLENOL) 160 MG/5ML solution 650 mg  650 mg Per Tube Q4H PRN Guilford Shi, MD       Or   acetaminophen (TYLENOL) suppository 650 mg  650 mg Rectal Q4H PRN Guilford Shi, MD       amLODipine (NORVASC) tablet 5 mg  5 mg Oral Daily [name], MD  5 mg at 11/08/20 0017   aspirin EC tablet 81 mg  81 mg Oral Daily Guilford Shi, MD   81 mg at 11/08/20 0939   atorvastatin (LIPITOR) tablet 40 mg  40 mg Oral Daily Guilford Shi, MD   40 mg at 11/08/20 4944   calcium carbonate (TUMS - dosed in mg elemental calcium) chewable tablet 200 mg of elemental calcium  1 tablet Oral BID WC Guilford Shi, MD   200 mg of elemental calcium at 11/08/20 0939   cholecalciferol (VITAMIN D3) tablet 1,000 Units  1,000 Units Oral Daily Guilford Shi, MD    1,000 Units at 11/08/20 0939   clopidogrel (PLAVIX) tablet 75 mg  75 mg Oral Daily Gardiner Barefoot, NP   75 mg at 11/08/20 0939   enoxaparin (LOVENOX) injection 40 mg  40 mg Subcutaneous Q24H Guilford Shi, MD   40 mg at 11/08/20 1317   famotidine (PEPCID) tablet 20 mg  20 mg Oral BID PRN Guilford Shi, MD       insulin aspart (novoLOG) injection 0-5 Units  0-5 Units Subcutaneous QHS Kamineni, Neelima, MD       insulin aspart (novoLOG) injection 0-9 Units  0-9 Units Subcutaneous TID WC Guilford Shi, MD   1 Units at 11/08/20 1317   insulin glargine-yfgn (SEMGLEE) injection 15 Units  15 Units Subcutaneous Daily [name], MD   15 Units at 11/08/20 0939   latanoprost (XALATAN) 0.005 % ophthalmic solution 1 drop  1 drop Both Eyes QPM Kamineni, Neelima, MD       melatonin tablet 10 mg  10 mg Oral QHS PRN Shalhoub, Sherryll Burger, MD   10 mg at 11/07/20 2128   omega-3 acid ethyl esters (LOVAZA) capsule 1 g  1 g Oral Daily Guilford Shi, MD   1 g at 11/08/20 0939   pantoprazole (PROTONIX) EC tablet 40 mg  40 mg Oral Daily Guilford Shi, MD   40 mg at 11/08/20 0939   senna-docusate (Senokot-S) tablet 1 tablet  1 tablet Oral QHS PRN Guilford Shi, MD       vitamin B-12 (CYANOCOBALAMIN) tablet 1,000 mcg  1,000 mcg Oral Daily Guilford Shi, MD   1,000 mcg at 11/08/20 9675     Discharge Medications: Please see discharge summary for a list of discharge medications.  Relevant Imaging Results:  Relevant Lab Results:   Additional Information SS#: [id number]  Pollie Friar, RN

## 2020-11-08 NOTE — Progress Notes (Signed)
STROKE TEAM PROGRESS NOTE   INTERVAL HISTORY Patient is sitting up in bed.  She is awake alert and interactive.  Vital signs are stable.  Neurological exam is unchanged.  Echocardiogram shows ejection fraction 60 to 65%.  EEG shows mild diffuse encephalopathy.  No seizures.  Hemoglobin A1c is 8.3.  LDL cholesterol is 1.2 mg percent.  No family at the bedside. Vitals:   11/07/20 2355 11/08/20 0422 11/08/20 0748 11/08/20 1130  BP: 108/90 (!) 165/82 130/76 (!) 114/59  Pulse: (!) 102 97 93 94  Resp: 18 17 14 18   Temp: 98.3 F (36.8 C) 98.3 F (36.8 C) 98.3 F (36.8 C)   TempSrc: Oral Oral Oral   SpO2: 100% 99% 99% 100%   CBC:  Recent Labs  Lab 11/07/20 0745 11/08/20 0352  WBC 8.0 8.1  NEUTROABS 5.2 5.1  HGB 11.8* 12.1  HCT 36.9 37.8  MCV 97.4 95.7  PLT 148* 621   Basic Metabolic Panel:  Recent Labs  Lab 11/07/20 0745 11/08/20 0352  NA 140 138  K 3.5 3.5  CL 108 104  CO2 26 26  GLUCOSE 80 180*  BUN 13 14  CREATININE 0.91 1.04*  CALCIUM 9.4 9.6            Lipid Panel:  Recent Labs  Lab 11/07/20 0548  CHOL 201*  TRIG 72  HDL 65  CHOLHDL 3.1  VLDL 14  LDLCALC 122*      HgbA1c:  Recent Labs  Lab 11/07/20 0548  HGBA1C 8.3*   Urine Drug Screen: No results for input(s): LABOPIA, COCAINSCRNUR, LABBENZ, AMPHETMU, THCU, LABBARB in the last 168 hours.  Alcohol Level No results for input(s): ETH in the last 168 hours.  IMAGING past 24 hours EEG adult  Result Date: 11/08/2020 Stacey Havens, MD     11/08/2020 11:26 AM Patient Name: Stacey Fuentes MRN: [id number] Epilepsy Attending: Lora Fuentes Referring Physician/Provider: Hetty Blend, NP Date: 11/08/2020 Duration: 21.49 mins Patient history: 85 y.o. 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 Per daughter patient has had recent falls, AMS, word finding  difficulty, staring spell and increased lethargy. EEG to evaluate for seizure. Level of alertness: Awake AEDs during EEG study: None Technical aspects: This EEG study was done with scalp electrodes positioned according to the 10-20 International system of electrode placement. Electrical activity was acquired at a sampling rate of 500Hz  and reviewed with a high frequency filter of 70Hz  and a low frequency filter of 1Hz . EEG data were recorded continuously and digitally stored. Description: The posterior dominant rhythm consists of 7.5 Hz activity of moderate voltage (25-35 uV) seen predominantly in posterior head regions, symmetric and reactive to eye opening and eye closing. EEG showed continuous generalized 5 to 6 Hz theta as well as intermittent 2-3Hz  delta slowing. Hyperventilation and photic stimulation were not performed.   ABNORMALITY - Continuous slow, generalized IMPRESSION: This study is suggestive of mild to moderate diffuse encephalopathy, nonspecific etiology. No seizures or epileptiform discharges were seen throughout the recording. Stacey Fuentes    PHYSICAL EXAM Frail elderly African-American lady not in distress. . Afebrile. Head is nontraumatic. Neck is supple without bruit.    Cardiac exam no murmur or gallop. Lungs are clear to auscultation. Distal pulses are well felt.  Neurological Exam Patient is awake alert disoriented to time place and person.  Diminished attention,  registration and recall.  She can follow only simple midline one-step commands.  Speech is clear without dysarthria.  Extraocular movements are full range without nystagmus.  Blinks to threat bilaterally.  Face symmetric without weakness.  Tongue midline.  Motor system exam she is able to move all 4 extremities well against gravity.  Is not very cooperative for detailed muscle testing.  No focal weakness.  He can reflexes symmetric.  Plantars downgoing.  Sensation appears preserved.  Gait not  tested. ASSESSMENT/PLAN Stacey Fuentes is a 85 y.o. 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 Per daughter patient has had recent falls, AMS, word finding difficulty, staring spell and increased lethargy.  During the night prior to admission patient had nausea and vomiting with inability to walk with same speech problems. Patient fell into the wall and sat down. Daughter was unable to move patient, stating she was "dead weight". Daughter called 24 and patient was brought to the ED for further evaluation. Neurology was consulted due to MRI findings.    Stroke: Small early subacute infarcts in the cerebellar vermis and left cerebellar hemisphere in the SCA distribution which may be embolic or atherosclerotic  CT head, initial No acute abnormality.Chronic atrophy and periventricular white matter microvascular disease. CT head Known acute infarcts in the cerebellar vermis and left cerebellar hemisphere are not well characterized by CT. No acute hemorrhage or mass effect. Re-demonstrated moderate to advanced chronic microvascular ischemic disease and atrophy with disproportionate right hippocampal volume loss. MRI   Small early subacute infarcts in the cerebellar vermis and left cerebellar hemisphere in the SCA distribution. Global parenchymal volume loss with disproportionate severe right hippocampal atrophy. Advanced chronic white matter microangiopathy  CTA Head Severe right greater than left paraclinoid ICA stenosis. The superior cerebellar arteries are difficult to assess due to their small size with nonvisualization of the mid and distal left superior cerebellar artery, likely stenotic or occluded. Small vertebrobasilar system with superimposed moderate narrowing of the mid basilar artery and severe right P2 PCA stenosis.  CTA  Neck: Approximately 40% stenosis of the distal right common carotid artery. Bilateral carotid bifurcation atherosclerosis without greater than 50% stenosis. 2D Echo  EF 60-65%, +LVH, No thrombus, wall motion abnormality or shunt found.   EEG mild to moderate diffuse encephalopathy.  No seizures LDL 122 HgbA1c 8.3. VTE prophylaxis - per primary team, recommended    Diet   Diet heart healthy/carb modified Room service appropriate? Yes; Fluid consistency: Thin   Not taking ASA 81mg  as prescribed at home because they thought it was causing constipation. DAPT with ASA 81mg  and Plavix 75mg  then ASA alone as monotherapy Therapy recommendations:  Pending progress may need SNF vs return home  Disposition:  TBD  Hypertension Stable Permissive hypertension (OK if < 220/120) but gradually normalize in 5-7 days Long-term BP goal normotensive  Hyperlipidemia Home meds: None LDL 122, goal < 70 High intensity statin: Lipitor 40mg  on board  Continue statin at discharge  Diabetes type II  HgbA1c PENDING goal < 7.0 No recent labs on file  CBGs Recent Labs    11/07/20 1720 11/08/20 0632 11/08/20 1232  GLUCAP 171* 180* 139*    Management per primary team       Dementia Losing function steadily since 2017 Now with hallucinations in past several months Daughter reports taken off Namenda after one week trial May need longer  trial once past initial stroke work up  Other Stroke Risk Factors Advanced Age >/= 2  Obesity, There is no height or weight on file to calculate BMI., BMI >/= 30 associated with increased stroke risk, recommend weight loss, diet and exercise as appropriate   Other Active Problems    .  Patient presented with sudden onset of difficulty walking and loss of balance due to small cerebellar infarcts.  She has baseline Lewy body dementia and may not be of good candidate for long-term anticoagulation hence will not pursue long-term cardiac monitoring.     Recommend  aspirin and Plavix for 3 weeks followed by aspirin alone.  Patient may consider outpatient trial of Namenda which she has not taken for long enough in the past to call her treatment failure and follow-up with neurologist Dr. Leta [facility] whom she has seen before..  Discussed with Dr. Verlon Au.  Greater than 50% time during this 25-minute visit was spent on counseling and coordination of care about her stroke and dementia and discussion with care team and answering questions.  Stroke team will sign off.  Kindly call for questions. Antony Contras, MD Medical Director [facility] Pager: [phone] 11/08/2020 1:31 PM  To contact Stroke Continuity provider, please refer to [email or web address] After hours, contact General Neurology

## 2020-11-08 NOTE — TOC Initial Note (Signed)
Transition of Care [facility]) - Initial/Assessment Note    Patient Details  Name: Stacey Fuentes MRN: [id number] Date of Birth: 01/24/1936  Transition of [facility] CM/SW Contact:    Pollie Friar, RN Phone Number: 11/08/2020, 3:16 PM  Clinical Narrative:                 Pt with some confusion. CM reached to her daughter, [name] about discharge planning. She would like the patient to attend [facility]. CM has completed FL2 and faxed her to [facility]. CM has messaged their representative to please review the referral.  TOC will f/u and start insurance once pt accepted at a facility.   Expected Discharge Plan: Skilled Nursing Facility Barriers to Discharge: Continued Medical Work up   Patient Goals and CMS Choice   CMS Medicare.gov Compare Post Acute Care list provided to:: Patient Represenative (must comment) Choice offered to / list presented to : Adult Children  Expected Discharge Plan and Services Expected Discharge Plan: [place] In-house Referral: Clinical Social Work Discharge Planning Services: CM Consult Post Acute Care Choice: [place] arrangements for the past 2 months: [place]                                      Prior Living Arrangements/Services Living arrangements for the past 2 months: Single Family Home Lives with:: Adult Children Patient language and need for interpreter reviewed:: Yes Do you feel safe going back to the place where you live?: Yes      Need for Family Participation in Patient Care: Yes (Comment) Care giver support system in place?: No (comment)   Criminal Activity/Legal Involvement Pertinent to Current Situation/Hospitalization: No - Comment as needed  Activities of Daily Living      Permission Sought/Granted                  Emotional Assessment Appearance:: Appears stated age     Orientation: : Oriented to Self, Oriented to Place   Psych  Involvement: No (comment)  Admission diagnosis:  CVA (cerebral vascular accident) ([place]) [I63.9] Lewy body dementia without behavioral disturbance ([place]) [G31.83, F02.80] Cerebellar infarct ([place]) [I63.9] Cerebrovascular accident (CVA), unspecified mechanism ([place]) [I63.9] Patient Active Problem List   Diagnosis Date Noted   Cerebellar infarct ([place]) 11/06/2020   Dementia without behavioral disturbance ([place]) 11/06/2020   Type 2 diabetes mellitus with other specified complication ([place]) 60/11/9321   CKD (chronic kidney disease), stage III ([place]) 11/06/2020   Essential hypertension 07/12/2017   Orthostatic syncope 06/11/2017   Aortic ejection murmur 06/11/2017   Uncontrolled type 2 diabetes mellitus with nephropathy ([place]) 03/24/2014   Upper abdominal pain 03/23/2014   Nausea 03/23/2014   Abnormal findings on radiological examination of gastrointestinal tract 03/23/2014   Infected sebaceous cyst 04/15/2013   PCP:  Harlan Stains, MD Pharmacy:   [facility] ([facility] [place] [address] [place] ([place]) [place] [address] Phone: [phone] Fax: [phone]  [facility]  ([place], [place] [address] [place] [address] Phone: [phone] Fax: [phone]     Social Determinants of Health (SDOH) Interventions    Readmission Risk Interventions No flowsheet data found.

## 2020-11-08 NOTE — Progress Notes (Signed)
PROGRESS NOTE   Stacey Fuentes  BTD:[id number] DOB: 1935-10-19 DOA: 11/06/2020 PCP: Harlan Stains, MD  Brief Narrative:   85 year old black female lives with daughter [name]---ambulatory with cane, dementia since 2017 followed by Dr. Leta Baptist, diabetes mellitus since 1991 followed by Dr. Chalmers Cater, orthostatic hypotension prior seen Dr. 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 - Lantus 15 units [home dose is 30] apparently not on short-acting insulin -CBG better 130-200  Delirium/dementia since 2017 -impulsive and was given Haldol overnight 9/27 -Reorient as able -Continue melatonin 10 hs  Hypertension - Continue amlodipine 5 mg daily   DVT prophylaxis: lovenox Code Status: FULL code Family Communication: [name] [phone] called and updated on 9/28 Disposition:  Status is: Inpatient  Remains inpatient appropriate because:Persistent severe electrolyte disturbances, Altered mental status, and IV treatments appropriate due to intensity of illness or inability to take PO  Dispo: The patient is from: Home              Anticipated d/c is to: SNF when able              Patient currently is not medically stable to d/c.   Difficult to place patient No   Consultants:  Neurology  Procedures:   Antimicrobials:     Subjective: Confused  Pulled out several IV Cannot obtain ROS   Objective: Vitals:   11/07/20 2355 11/08/20 0422 11/08/20 0748 11/08/20 1130  BP: 108/90 (!) 165/82 130/76 (!) 114/59  Pulse: (!) 102  97 93 94  Resp: 18 17 14 18   Temp: 98.3 F (36.8 C) 98.3 F (36.8 C) 98.3 F (36.8 C)   TempSrc: Oral Oral Oral   SpO2: 100% 99% 99% 100%    Intake/Output Summary (Last 24 hours) at 11/08/2020 1323 Last data filed at 11/08/2020 6270 Gross per 24 hour  Intake 1783.97 ml  Output --  Net 1783.97 ml    There were no vitals filed for this visit.  Examination:  incoherent cannot tell me time place person Vision by direct confrontation is intact Answer simple questions incorrectly CTA B no added sound no rales no rhonchi S1-S2 tachycardic no murmur no rub no gallop Reflexes are equivocal poor exam she is a little tremulous She is able to raise her arms above her head and grip fingers  Data Reviewed: personally reviewed   CBC    Component Value Date/Time   WBC 8.1 11/08/2020 0352   RBC 3.95 11/08/2020 0352   HGB 12.1 11/08/2020 0352   HCT 37.8 11/08/2020 0352   PLT 158 11/08/2020 0352   MCV 95.7 11/08/2020 0352   MCH 30.6 11/08/2020 0352   MCHC 32.0 11/08/2020 0352   RDW 12.0 11/08/2020 0352   LYMPHSABS 2.0 11/08/2020 0352   MONOABS 0.7 11/08/2020 0352   EOSABS 0.3 11/08/2020 0352   BASOSABS 0.0 11/08/2020 0352   CMP Latest Ref Rng & Units 11/08/2020 11/07/2020 11/06/2020  Glucose 70 - 99 mg/dL 180(H) 80 -  BUN 8 - 23 mg/dL 14 13 -  Creatinine 0.44 - 1.00 mg/dL 1.04(H) 0.91 0.97  Sodium 135 - 145 mmol/L 138 140 -  Potassium 3.5 - 5.1 mmol/L 3.5 3.5 -  Chloride 98 - 111 mmol/L 104 108 -  CO2 22 - 32 mmol/L 26 26 -  Calcium 8.9 - 10.3 mg/dL 9.6 9.4 -  Total Protein 6.5 - 8.1 g/dL - 6.1(L) -  Total Bilirubin 0.3 - 1.2 mg/dL - 0.8 -  Alkaline Phos 38 - 126 U/L - 48 -  AST 15 - 41 U/L - 36 -  ALT 0 - 44 U/L - 16 -     Radiology Studies: EEG adult  Result Date: 11/10/2020 Lora Havens, MD     Nov 10, 2020 11:26 AM Patient Name: Stacey Fuentes MRN: [id number] Epilepsy Attending: Lora Havens Referring Physician/Provider: Hetty Blend, NP Date:  Nov 10, 2020 Duration: 21.49 mins Patient history: 85 y.o. 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 Per daughter patient has had recent falls, AMS, word finding difficulty, staring spell and increased lethargy. EEG to evaluate for seizure. Level of alertness: Awake AEDs during EEG study: None Technical aspects: This EEG study was done with scalp electrodes positioned according to the 10-20 International system of electrode placement. Electrical activity was acquired at a sampling rate of 500Hz  and reviewed with a high frequency filter of 70Hz  and a low frequency filter of 1Hz . EEG data were recorded continuously and digitally stored. Description: The posterior dominant rhythm consists of 7.5 Hz activity of moderate voltage (25-35 uV) seen predominantly in posterior head regions, symmetric and reactive to eye opening and eye closing. EEG showed continuous generalized 5 to 6 Hz theta as well as intermittent 2-3Hz  delta slowing. Hyperventilation and photic stimulation were not performed.   ABNORMALITY - Continuous slow, generalized IMPRESSION: This study is suggestive of mild to moderate diffuse encephalopathy, nonspecific etiology. No seizures or epileptiform discharges were seen throughout the recording. Lora Havens   ECHOCARDIOGRAM COMPLETE  Result Date: 11/07/2020    ECHOCARDIOGRAM REPORT   Patient Name:   Stacey Fuentes Date of Exam: 11/07/2020 Medical Rec #:  [id number]      Height:       62.0 in Accession #:    [id number]     Weight:       160.0 lb Date of Birth:  Apr 14, 1935      BSA:          1.739 m Patient Age:    69 years       BP:           147/85 mmHg Patient Gender: F              HR:           95 bpm. Exam Location:  Inpatient Procedure: 2D Echo, Cardiac Doppler and Color Doppler Indications:    Stroke I63.9  History:        Patient has prior history of  Echocardiogram examinations, most                 recent 06/17/2017.  Sonographer:    Merrie Roof RDCS Referring Phys: [id number] [place]  1. Left ventricular ejection fraction, by estimation, is 60 to 65%. The left ventricle has normal function. The left ventricle has no regional wall motion abnormalities. There is moderate left ventricular hypertrophy. Left ventricular diastolic parameters are consistent with Grade I diastolic dysfunction (impaired relaxation).  2. Right ventricular  systolic function is normal. The right ventricular size is normal.  3. The mitral valve is abnormal. Trivial mitral valve regurgitation. No evidence of mitral stenosis.  4. The aortic valve is tricuspid. There is mild calcification of the aortic valve. Aortic valve regurgitation is not visualized. Mild aortic valve sclerosis is present, with no evidence of aortic valve stenosis.  5. The inferior vena cava is normal in size with greater than 50% respiratory variability, suggesting right atrial pressure of 3 mmHg. FINDINGS  Left Ventricle: Left ventricular ejection fraction, by estimation, is 60 to 65%. The left ventricle has normal function. The left ventricle has no regional wall motion abnormalities. The left ventricular internal cavity size was normal in size. There is  moderate left ventricular hypertrophy. Left ventricular diastolic parameters are consistent with Grade I diastolic dysfunction (impaired relaxation). Right Ventricle: The right ventricular size is normal. No increase in right ventricular wall thickness. Right ventricular systolic function is normal. Left Atrium: Left atrial size was normal in size. Right Atrium: Right atrial size was normal in size. Pericardium: There is no evidence of pericardial effusion. Mitral Valve: The mitral valve is abnormal. There is mild thickening of the mitral valve leaflet(s). There is mild calcification of the mitral valve leaflet(s). Mild mitral annular calcification.  Trivial mitral valve regurgitation. No evidence of mitral valve stenosis. Tricuspid Valve: The tricuspid valve is normal in structure. Tricuspid valve regurgitation is not demonstrated. No evidence of tricuspid stenosis. Aortic Valve: The aortic valve is tricuspid. There is mild calcification of the aortic valve. Aortic valve regurgitation is not visualized. Mild aortic valve sclerosis is present, with no evidence of aortic valve stenosis. Aortic valve mean gradient measures 5.0 mmHg. Aortic valve peak gradient measures 7.4 mmHg. Aortic valve area, by VTI measures 2.56 cm. Pulmonic Valve: The pulmonic valve was normal in structure. Pulmonic valve regurgitation is not visualized. No evidence of pulmonic stenosis. Aorta: The aortic root is normal in size and structure. Venous: The inferior vena cava is normal in size with greater than 50% respiratory variability, suggesting right atrial pressure of 3 mmHg. IAS/Shunts: No atrial level shunt detected by color flow Doppler.  LEFT VENTRICLE PLAX 2D LVIDd:         3.50 cm  Diastology LVIDs:         2.30 cm  LV e' medial:    4.90 cm/s LV PW:         1.30 cm  LV E/e' medial:  15.1 LV IVS:        1.40 cm  LV e' lateral:   6.31 cm/s LVOT diam:     2.00 cm  LV E/e' lateral: 11.7 LV SV:         69 LV SV Index:   40 LVOT Area:     3.14 cm  RIGHT VENTRICLE RV Basal diam:  2.80 cm LEFT ATRIUM             Index       RIGHT ATRIUM           Index LA diam:        3.20 cm 1.84 cm/m  RA Area:     14.30 cm LA Vol (A2C):   62.1 ml 35.72 ml/m RA Volume:   34.20 ml  19.67 ml/m LA Vol (A4C):   45.7 ml 26.28 ml/m LA Biplane Vol: 56.0 ml 32.21 ml/m  AORTIC VALVE AV Area (Vmax):    2.66 cm AV Area (Vmean):   2.44 cm AV Area (VTI):  2.56 cm AV Vmax:           136.00 cm/s AV Vmean:          102.000 cm/s AV VTI:            0.271 m AV Peak Grad:      7.4 mmHg AV Mean Grad:      5.0 mmHg LVOT Vmax:         115.00 cm/s LVOT Vmean:        79.300 cm/s LVOT VTI:          0.221 m LVOT/AV  VTI ratio: 0.82  AORTA Ao Root diam: 3.50 cm Ao Asc diam:  3.40 cm MITRAL VALVE MV Area (PHT): 4.06 cm     SHUNTS MV Decel Time: 187 msec     Systemic VTI:  0.22 m MV E velocity: 74.10 cm/s   Systemic Diam: 2.00 cm MV A velocity: 114.00 cm/s MV E/A ratio:  0.65 Kniskern Rouge MD Electronically signed by Mimnaugh Rouge MD Signature Date/Time: 11/07/2020/9:45:48 AM    Final      Scheduled Meds:   stroke: mapping our early stages of recovery book   Does not apply Once   amLODipine  5 mg Oral Daily   aspirin EC  81 mg Oral Daily   atorvastatin  40 mg Oral Daily   calcium carbonate  1 tablet Oral BID WC   cholecalciferol  1,000 Units Oral Daily   clopidogrel  75 mg Oral Daily   enoxaparin (LOVENOX) injection  40 mg Subcutaneous Q24H   insulin aspart  0-5 Units Subcutaneous QHS   insulin aspart  0-9 Units Subcutaneous TID WC   insulin glargine-yfgn  15 Units Subcutaneous Daily   latanoprost  1 drop Both Eyes QPM   omega-3 acid ethyl esters  1 g Oral Daily   pantoprazole  40 mg Oral Daily   vitamin B-12  1,000 mcg Oral Daily   Continuous Infusions:  sodium chloride 50 mL/hr at 11/07/20 1850     LOS: 2 days   Time spent: [place], MD [facility] To contact the attending provider between 7A-7P or the covering provider during after hours 7P-7A, please log into the web site [email or web address] and access using universal [place] password for that web site. If you do not have the password, please call the hospital operator.  11/08/2020, 1:23 PM

## 2020-11-08 NOTE — Progress Notes (Signed)
Inpatient Rehabilitation Admissions Coordinator   Inpatient rehab consult received. I met with patient at bedside and then contacted her daughter, [name] by phone. CIR bed is not available over the next several days for this patient to admit and I recommend SNF rehab. Daughter has a preference for [facility] SNF. I have alerted acute team and TOC, Kelli. We will sign off at this time.  Danne Baxter, RN, MSN Rehab Admissions Coordinator [phone] 11/08/2020 2:25 PM

## 2020-11-08 NOTE — Plan of Care (Signed)

## 2020-11-09 DIAGNOSIS — I639 Cerebral infarction, unspecified: Secondary | ICD-10-CM | POA: Diagnosis not present

## 2020-11-09 LAB — BASIC METABOLIC PANEL
Anion gap: 10 (ref 5–15)
BUN: 16 mg/dL (ref 8–23)
CO2: 25 mmol/L (ref 22–32)
Calcium: 9.3 mg/dL (ref 8.9–10.3)
Chloride: 103 mmol/L (ref 98–111)
Creatinine, Ser: 1.08 mg/dL — ABNORMAL HIGH (ref 0.44–1.00)
GFR, Estimated: 51 mL/min — ABNORMAL LOW (ref >60)
Glucose, Bld: 140 mg/dL — ABNORMAL HIGH (ref 70–99)
Potassium: 3.8 mmol/L (ref 3.5–5.1)
Sodium: 138 mmol/L (ref 135–145)

## 2020-11-09 LAB — CBC WITH DIFFERENTIAL/PLATELET
Abs Immature Granulocytes: 0.05 10*3/uL (ref 0.00–0.07)
Basophils Absolute: 0 10*3/uL (ref 0.0–0.1)
Basophils Relative: 1 %
Eosinophils Absolute: 0.2 10*3/uL (ref 0.0–0.5)
Eosinophils Relative: 3 %
HCT: 37.8 % (ref 36.0–46.0)
Hemoglobin: 12.6 g/dL (ref 12.0–15.0)
Immature Granulocytes: 1 %
Lymphocytes Relative: 31 %
Lymphs Abs: 2.5 10*3/uL (ref 0.7–4.0)
MCH: 31.3 pg (ref 26.0–34.0)
MCHC: 33.3 g/dL (ref 30.0–36.0)
MCV: 94 fL (ref 80.0–100.0)
Monocytes Absolute: 0.5 10*3/uL (ref 0.1–1.0)
Monocytes Relative: 7 %
Neutro Abs: 4.8 10*3/uL (ref 1.7–7.7)
Neutrophils Relative %: 57 %
Platelets: 71 10*3/uL — ABNORMAL LOW (ref 150–400)
RBC: 4.02 MIL/uL (ref 3.87–5.11)
RDW: 11.9 % (ref 11.5–15.5)
WBC: 8.1 10*3/uL (ref 4.0–10.5)
nRBC: 0 % (ref 0.0–0.2)

## 2020-11-09 LAB — GLUCOSE, CAPILLARY
Glucose-Capillary: 140 mg/dL — ABNORMAL HIGH (ref 70–99)
Glucose-Capillary: 215 mg/dL — ABNORMAL HIGH (ref 70–99)
Glucose-Capillary: 182 mg/dL — ABNORMAL HIGH (ref 70–99)
Glucose-Capillary: 225 mg/dL — ABNORMAL HIGH (ref 70–99)

## 2020-11-09 LAB — CBC
HCT: 38.2 % (ref 36.0–46.0)
Hemoglobin: 12.2 g/dL (ref 12.0–15.0)
MCH: 31.1 pg (ref 26.0–34.0)
MCHC: 31.9 g/dL (ref 30.0–36.0)
MCV: 97.4 fL (ref 80.0–100.0)
Platelets: 160 10*3/uL (ref 150–400)
RBC: 3.92 MIL/uL (ref 3.87–5.11)
RDW: 12.1 % (ref 11.5–15.5)
WBC: 7.6 10*3/uL (ref 4.0–10.5)
nRBC: 0 % (ref 0.0–0.2)

## 2020-11-09 MED ORDER — METOPROLOL SUCCINATE ER 25 MG PO TB24
12.5000 mg | ORAL_TABLET | Freq: Every day | ORAL | Status: DC
  Administered 2020-11-09 – 2020-11-12 (×4): 12.5 mg via ORAL
  Filled 2020-11-09 (×6): qty 1

## 2020-11-09 MED ORDER — MEMANTINE HCL 10 MG PO TABS
5.0000 mg | ORAL_TABLET | Freq: Every day | ORAL | Status: DC
  Administered 2020-11-09 – 2020-11-13 (×5): 5 mg via ORAL
  Filled 2020-11-09 (×6): qty 1

## 2020-11-09 MED ORDER — INSULIN GLARGINE-YFGN 100 UNIT/ML ~~LOC~~ SOLN
18.0000 [IU] | Freq: Every day | SUBCUTANEOUS | Status: DC
  Administered 2020-11-10 – 2020-11-12 (×3): 18 [IU] via SUBCUTANEOUS
  Filled 2020-11-09 (×4): qty 0.18

## 2020-11-09 MED ORDER — HALOPERIDOL LACTATE 5 MG/ML IJ SOLN
4.0000 mg | Freq: Once | INTRAMUSCULAR | Status: AC
  Administered 2020-11-10: 4 mg via INTRAMUSCULAR
  Filled 2020-11-09: qty 1

## 2020-11-09 NOTE — Progress Notes (Signed)
Physical Therapy Treatment Patient Details Name: Stacey Fuentes MRN: [id number] DOB: 1935/08/04 Today's Date: 11/09/2020   History of Present Illness Pt is an 85 y/o female admitted 9/27 secondary to worsening weakness and ambulatory function. Imaging showed infarct in cerebellar vermis and L cerebellar hemisphere. PMH includes dementia, HTN, CKD, DM, osteoporosis, and tremors.    PT Comments    Pt received in supine, agreeable to therapy session and A&O x 0 but pleasant and cooperative as able. Pt benefits from multimodal cues for bed mobility and transfers and demonstrates increased posterior lean today in stance with +2 HHA so unable to progress gait training this date. Recommend nursing staff utilize [facility] for pivot transfers to chair or BSC. Pt needing mod to maxA +1-2 for transfers. Pt continues to benefit from PT services to progress toward functional mobility goals. Disposition and frequency updated per discussion with Supervising PT Marks and per chart review/limited progress toward goals and pt and family have decided on low intensity post-acute rehab.  Recommendations for follow up therapy are one component of a multi-disciplinary discharge planning process, led by the attending physician.  Recommendations may be updated based on patient status, additional functional criteria and insurance authorization.  Follow Up Recommendations  SNF;Supervision/Assistance - 24 hour;Supervision for mobility/OOB     Equipment Recommendations  Wheelchair cushion (measurements PT);Wheelchair (measurements PT);Other (comment) (may need mechanical lift and hospital bed pending progress)    Recommendations for Other Services       Precautions / Restrictions Precautions Precautions: Fall Restrictions Weight Bearing Restrictions: No     Mobility  Bed Mobility Overal bed mobility: Needs Assistance Bed Mobility: Rolling;Sidelying to Sit;Sit to Sidelying Rolling: Min assist;Mod  assist Sidelying to sit: Min assist;+2 for physical assistance     Sit to sidelying: Mod assist;+2 for safety/equipment General bed mobility comments: assisted with LEs and lifting and lowering trunk, multimodal cues and rolling to side prior to sitting up for safety; pt rolling L/R multiple reps after transfer for hygiene/brief change with mostly modA/tactile cues    Transfers Overall transfer level: Needs assistance Equipment used: 2 person hand held assist Transfers: Sit to/from Stand Sit to Stand: +2 physical assistance;Mod assist;+2 safety/equipment         General transfer comment: mod A to boost into standing, pt with increasing posterior lean upon standing needs modA initially progressing to maxA  Ambulation/Gait Ambulation/Gait assistance: Max assist;+2 physical assistance Gait Distance (Feet): 2 Feet Assistive device: 2 person hand held assist Gait Pattern/deviations: Ataxic;Decreased stride length;Decreased dorsiflexion - right;Decreased dorsiflexion - left;Shuffle;Leaning posteriorly;Narrow base of support     General Gait Details: pt with posterior bias and unable to correct balance for proper base of support; a few shuffled side steps along EOB with BLE support of bed frame.      Modified Rankin (Stroke Patients Only) Modified Rankin (Stroke Patients Only) Pre-Morbid Rankin Score: Slight disability Modified Rankin: Moderately severe disability     Balance Overall balance assessment: Needs assistance Sitting-balance support: No upper extremity supported;Feet supported Sitting balance-Leahy Scale: Fair     Standing balance support: Bilateral upper extremity supported Standing balance-Leahy Scale: Zero Standing balance comment: +2 maxA in stance -posterior lean;nearly totalA while stepping                            Cognition Arousal/Alertness: Awake/alert Behavior During Therapy: WFL for tasks assessed/performed Overall Cognitive Status:  History of cognitive impairments - at baseline  General Comments: Dementia at baseline, pt laughing/cooperative today.      Exercises Other Exercises Other Exercises: supine BLE AAROM: heel slides, hip abduction x10 reps ea Other Exercises: seated BLE AROM: LAQ, ankle pumps x 5-10 reps ea    General Comments General comments (skin integrity, edema, etc.): HR 80's-90's bpm      Pertinent Vitals/Pain Pain Assessment: No/denies pain     PT Goals (current goals can now be found in the care plan section) Acute Rehab PT Goals Patient Stated Goal: for pt to be able to walk better per daughter PT Goal Formulation: With patient/family Time For Goal Achievement: 11/20/20 Progress towards PT goals: Progressing toward goals    Frequency    Min 3X/week      PT Plan Discharge plan needs to be updated;Frequency needs to be updated       AM-PAC PT "6 Clicks" Mobility   Outcome Measure  Help needed turning from your back to your side while in a flat bed without using bedrails?: A Lot Help needed moving from lying on your back to sitting on the side of a flat bed without using bedrails?: A Lot Help needed moving to and from a bed to a chair (including a wheelchair)?: Total Help needed standing up from a chair using your arms (e.g., wheelchair or bedside chair)?: A Lot Help needed to walk in hospital room?: Total Help needed climbing 3-5 steps with a railing? : Total 6 Click Score: 9    End of Session Equipment Utilized During Treatment: Gait belt Activity Tolerance: Patient tolerated treatment well Patient left: in bed;with call bell/phone within reach;with bed alarm set;with family/visitor present (bed in chair position to eat lunch, HOB >30 deg) Nurse Communication: Mobility status;Need for lift equipment (use [facility] for pivot transfers to [facility] or recliner) PT Visit Diagnosis: Unsteadiness on feet (R26.81);Muscle weakness (generalized) (M62.81);Difficulty in walking, not  elsewhere classified (R26.2);Ataxic gait (R26.0)     Time: 5726-2035 PT Time Calculation (min) (ACUTE ONLY): 26 min  Charges:  $Therapeutic Exercise: 8-22 mins $Therapeutic Activity: 8-22 mins                     Ensley Blas P., PTA Acute Rehabilitation Services Pager: [phone] Office: [place] 11/09/2020, 12:55 PM

## 2020-11-09 NOTE — Progress Notes (Signed)
PROGRESS NOTE  Stacey Fuentes  HRC:[id number] DOB: October 16, 1935 DOA: 11/06/2020 PCP: Harlan Stains, MD  Brief Narrative:   85 year old black female lives with daughter [name]---ambulatory with cane, dementia since 2017 followed by Dr. Leta Baptist, diabetes mellitus since 1991 followed by Dr. Chalmers Cater, orthostatic hypotension prior seen Dr. Ellyn Hack, CKD 2, benign essential tremors Brought to ED 9/27 increasing lethargy, increased tremors more wobbly and fell backwards into a chair-initially went to [place]-left because not seen came back to [facility], ED Imaging obtained showing early patchy left SCA stroke-neurology consulted   Hospital-Problem based course  Left SCA stroke - aspirin 81 Plavix 75--will need both for 3 weeks and then discontinue Plavix and aspirin to continue - Continue Lipitor 40 daily - Echo EF 60-65% grd 1 DD -EEG shows mild to moderate diffuse encephalopathy which was nonspecific with no epilepsy, no further work-up and no need for AED  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 - Metoprolol cut back to 12.5 XL daily given some pauses Titrate as needed  Delirium/dementia since 2017 -impulsive at times with some mild hallucinations - She is somewhat reorientable -Restart low-dose Namenda 5 mg after discussion with family -Continue melatonin 10 hs  Hypertension - Continue amlodipine 5 mg daily   DVT prophylaxis: lovenox Code Status: FULL code Family Communication: [name] [phone] called and updated on 9/30 Disposition:  Status is: Inpatient  Remains inpatient appropriate because:Persistent severe electrolyte disturbances, Altered mental status, and IV treatments appropriate due to intensity of illness or inability to take PO  Dispo: The patient  is from: Home              Anticipated d/c is to: SNF when able              Patient currently is not medically stable to d/c.   Difficult to place patient No   Consultants:  Neurology  Procedures:   Antimicrobials:     Subjective:  Somewhat confused still Cannot orient to time place or person Otherwise redirectable  no fever no chills no nausea no vomiting Some pauses on monitors   Objective: Vitals:   11/08/20 2009 11/08/20 2309 11/09/20 0310 11/09/20 1150  BP: 109/73 (!) 151/70 123/61 109/66  Pulse: 93 93 87 80  Resp: 12 15 14 18   Temp: (!) 97.4 F (36.3 C)  98 F (36.7 C) 97.7 F (36.5 C)  TempSrc: Oral  Oral Oral  SpO2: 98% 100% 99% 97%    Intake/Output Summary (Last 24 hours) at 11/09/2020 1151 Last data filed at 11/09/2020 0900 Gross per 24 hour  Intake 614 ml  Output --  Net 614 ml    There were no vitals filed for this visit.  Examination:  incoherent does not orient well CTA B no added sound no rales no rhonchi S1-S2 tachycardic no murmur no rub no gallop-monitor show occasional pauses and dropped beats Reflexes are equivocal poor exam she is a little tremulous Raises arms above head but full exam circumscribed as patient unable to follow directions for full neuro testing  Data Reviewed: personally reviewed   CBC    Component Value Date/Time   WBC 8.1 11/09/2020 0323   RBC 4.02 11/09/2020 0323   HGB 12.6 11/09/2020 0323   HCT 37.8 11/09/2020 0323   PLT 71 (L) 11/09/2020  0323   MCV 94.0 11/09/2020 0323   MCH 31.3 11/09/2020 0323   MCHC 33.3 11/09/2020 0323   RDW 11.9 11/09/2020 0323   LYMPHSABS 2.5 11/09/2020 0323   MONOABS 0.5 11/09/2020 0323   EOSABS 0.2 11/09/2020 0323   BASOSABS 0.0 11/09/2020 0323   CMP Latest Ref Rng & Units 11/09/2020 2020-11-17 11/07/2020  Glucose 70 - 99 mg/dL 140(H) 180(H) 80  BUN 8 - 23 mg/dL 16 14 13   Creatinine 0.44 - 1.00 mg/dL 1.08(H) 1.04(H) 0.91  Sodium 135 - 145 mmol/L 138 138 140  Potassium 3.5 -  5.1 mmol/L 3.8 3.5 3.5  Chloride 98 - 111 mmol/L 103 104 108  CO2 22 - 32 mmol/L 25 26 26   Calcium 8.9 - 10.3 mg/dL 9.3 9.6 9.4  Total Protein 6.5 - 8.1 g/dL - - 6.1(L)  Total Bilirubin 0.3 - 1.2 mg/dL - - 0.8  Alkaline Phos 38 - 126 U/L - - 48  AST 15 - 41 U/L - - 36  ALT 0 - 44 U/L - - 16     Radiology Studies: EEG adult  Result Date: 11/17/20 Lora Havens, MD     11/17/20 11:26 AM Patient Name: Stacey Fuentes MRN: [id number] Epilepsy Attending: Lora Havens Referring Physician/Provider: Hetty Blend, NP Date: Nov 17, 2020 Duration: 21.49 mins Patient history: 86 y.o. 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 Per daughter patient has had recent falls, AMS, word finding difficulty, staring spell and increased lethargy. EEG to evaluate for seizure. Level of alertness: Awake AEDs during EEG study: None Technical aspects: This EEG study was done with scalp electrodes positioned according to the 10-20 International system of electrode placement. Electrical activity was acquired at a sampling rate of 500Hz  and reviewed with a high frequency filter of 70Hz  and a low frequency filter of 1Hz . EEG data were recorded continuously and digitally stored. Description: The posterior dominant rhythm consists of 7.5 Hz activity of moderate voltage (25-35 uV) seen predominantly in posterior head regions, symmetric and reactive to eye opening and eye closing. EEG showed continuous generalized 5 to 6 Hz theta as well as intermittent 2-3Hz  delta slowing. Hyperventilation and photic stimulation were not performed.   ABNORMALITY - Continuous slow, generalized IMPRESSION: This study is suggestive of mild to moderate diffuse encephalopathy, nonspecific etiology. No seizures or epileptiform discharges were seen throughout the recording. Priyanka Barbra Sarks      Scheduled Meds:   stroke: mapping our early stages of recovery book   Does not apply Once   amLODipine  5 mg Oral Daily   aspirin EC  81 mg Oral Daily   atorvastatin  40 mg Oral Daily   calcium carbonate  1 tablet Oral BID WC   cholecalciferol  1,000 Units Oral Daily   clopidogrel  75 mg Oral Daily   insulin aspart  0-5 Units Subcutaneous QHS   insulin aspart  0-9 Units Subcutaneous TID WC   insulin glargine-yfgn  15 Units Subcutaneous Daily   latanoprost  1 drop Both Eyes QPM   metoprolol succinate  12.5 mg Oral Daily   omega-3 acid ethyl esters  1 g Oral Daily   pantoprazole  40 mg Oral Daily   vitamin B-12  1,000 mcg Oral Daily   Continuous Infusions:     LOS: 3 days   Time spent: 21  [name], MD [facility]  To contact the attending provider between 7A-7P or the covering provider during after hours 7P-7A, please log into the web site [email or web address] and access using universal  password for that web site. If you do not have the password, please call the hospital operator.  11/09/2020, 11:51 AM

## 2020-11-09 NOTE — Progress Notes (Signed)
TRH night shift telemetry coverage note.  The nursing staff reported that the patient is restless, confused, refusing oral meds and getting out of bed frequently.  They are concerned about her high risk for a fall.  She has no IV access at the moment.  Her EKG done recently did not show any QTC abnormalities.  Haloperidol 4 mg IM x1 dose and delirium precautions ordered.  Tennis Must, MD.

## 2020-11-09 NOTE — TOC Progression Note (Signed)
Transition of Care [facility]) - Progression Note    Patient Details  Name: Stacey Fuentes MRN: [id number] Date of Birth: [birth year]  Transition of Care [facility]) CM/SW [place], [place] Phone Number: 11/09/2020, 1:46 PM  Clinical Narrative:    CSW confirmed with facility that they are able to offer a bed, and they can offer over the weekend. Per MD note, pt not medically stable today, updated MD facility can take when ready. CSW updated daughter who is agreeable to [facility]. CSW will start auth when PT OT put in new notes. CSW will continue to follow.   Expected Discharge Plan: [place] Barriers to Discharge: Continued Medical Work up  Expected Discharge Plan and Services Expected Discharge Plan: [place] In-house Referral: Clinical Social Work Discharge Planning Services: CM Consult Post Acute Care Choice: [place] arrangements for the past 2 months: Single Family Home                                       Social Determinants of Health (SDOH) Interventions    Readmission Risk Interventions No flowsheet data found.

## 2020-11-09 NOTE — Plan of Care (Signed)

## 2020-11-09 NOTE — Care Management Important Message (Signed)
Important Message  Patient Details  Name: Stacey Fuentes MRN: [id number] Date of Birth: February 11, 1935   Medicare Important Message Given:  Yes     Rafal Archuleta [address] 11/09/2020, 3:34 PM

## 2020-11-10 DIAGNOSIS — I639 Cerebral infarction, unspecified: Secondary | ICD-10-CM | POA: Diagnosis not present

## 2020-11-10 LAB — GLUCOSE, CAPILLARY
Glucose-Capillary: 249 mg/dL — ABNORMAL HIGH (ref 70–99)
Glucose-Capillary: 226 mg/dL — ABNORMAL HIGH (ref 70–99)
Glucose-Capillary: 164 mg/dL — ABNORMAL HIGH (ref 70–99)
Glucose-Capillary: 223 mg/dL — ABNORMAL HIGH (ref 70–99)
Glucose-Capillary: 185 mg/dL — ABNORMAL HIGH (ref 70–99)

## 2020-11-10 LAB — SARS CORONAVIRUS 2 (TAT 6-24 HRS): SARS Coronavirus 2: NEGATIVE

## 2020-11-10 MED ORDER — HALOPERIDOL LACTATE 5 MG/ML IJ SOLN: 2.0000 mg | Freq: Once | INTRAMUSCULAR | Status: DC

## 2020-11-10 MED ORDER — HALOPERIDOL 0.5 MG PO TABS
1.0000 mg | ORAL_TABLET | Freq: Three times a day (TID) | ORAL | Status: DC | PRN
  Filled 2020-11-10: qty 2

## 2020-11-10 MED ORDER — HALOPERIDOL LACTATE 5 MG/ML IJ SOLN
2.0000 mg | Freq: Once | INTRAMUSCULAR | Status: AC
  Administered 2020-11-10: 2 mg via INTRAMUSCULAR
  Filled 2020-11-10: qty 1

## 2020-11-10 NOTE — Plan of Care (Signed)
Pt is alert oriented x 1. Pt was restless, confused and trying to get out of bed. Pt high fall risk, on call provider paged to inform. Pt received IM haldol. Pt is awake alert and calm this morning.   Problem: Education: Goal: Knowledge of General Education information will improve Description: Including pain rating scale, medication(s)/side effects and non-pharmacologic comfort measures Outcome: Progressing   Problem: Health Behavior/Discharge Planning: Goal: Ability to manage health-related needs will improve Outcome: Progressing   Problem: Clinical Measurements: Goal: Ability to maintain clinical measurements within normal limits will improve Outcome: Progressing Goal: Will remain free from infection Outcome: Progressing Goal: Diagnostic test results will improve Outcome: Progressing Goal: Respiratory complications will improve Outcome: Progressing Goal: Cardiovascular complication will be avoided Outcome: Progressing   Problem: Nutrition: Goal: Adequate nutrition will be maintained Outcome: Progressing   Problem: Coping: Goal: Level of anxiety will decrease Outcome: Progressing   Problem: Elimination: Goal: Will not experience complications related to bowel motility Outcome: Progressing Goal: Will not experience complications related to urinary retention Outcome: Progressing   Problem: Pain Managment: Goal: General experience of comfort will improve Outcome: Progressing   Problem: Safety: Goal: Ability to remain free from injury will improve Outcome: Progressing   Problem: Skin Integrity: Goal: Risk for impaired skin integrity will decrease Outcome: Progressing

## 2020-11-10 NOTE — TOC Progression Note (Signed)
Transition of Care [facility]) - Progression Note    Patient Details  Name: Stacey Fuentes MRN: [id number] Date of Birth: [birth year]  Transition of Care [facility]) CM/SW [place], [place] Phone Number: 11/10/2020, 9:44 AM  Clinical Narrative:    CSW noted pt was given a chemical restraint and put on delirium precautions last night. Facility is requesting we hold discharge another day to see how pt does. CSW updated MD. Authorization approved, will continue to follow. [id number] 11/10/2020-11/13/2020   Expected Discharge Plan: Skilled Nursing Facility Barriers to Discharge: Continued Medical Work up  Expected Discharge Plan and Services Expected Discharge Plan: [place] In-house Referral: Clinical Social Work Discharge Planning Services: CM Consult Post Acute Care Choice: [place] arrangements for the past 2 months: Single Family Home                                       Social Determinants of Health (SDOH) Interventions    Readmission Risk Interventions No flowsheet data found.

## 2020-11-10 NOTE — Progress Notes (Signed)
PROGRESS NOTE  Stacey Fuentes  WER:[id number] DOB: May 15, 1935 DOA: 11/06/2020 PCP: Harlan Stains, MD  Brief Narrative:   85 year old black female lives with daughter Stacey---ambulatory with cane, dementia since 2017 followed by Dr. Leta Baptist, diabetes mellitus since 1991 followed by Dr. Chalmers Cater, orthostatic hypotension prior seen Dr. Ellyn Hack, CKD 2, benign essential tremors Brought to ED 9/27 increasing lethargy, increased tremors more wobbly and fell backwards into a chair-initially went to [place] [facility]-left because not seen came back to [facility], ED Imaging obtained showing early patchy left SCA stroke-neurology consulted   Hospital-Problem based course  Left SCA stroke - aspirin 81 Plavix 75--will need both for 3 weeks and then discontinue Plavix and aspirin to continue - Continue Lipitor 40 daily - Echo EF 60-65% grd 1 DD -EEG shows mild to moderate diffuse encephalopathy which was nonspecific with no epilepsy, no further work-up and no need for AED  DM 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 - Metoprolol cut back to 12.5 XL daily given some pauses Titrate as needed  Delirium/dementia since 2017 -impulsive at times with some mild hallucinations - She is somewhat reorientable -Restart low-dose Namenda 5 mg after discussion with family, add low dose haldol 0.5 bid for agitation -Continue melatonin 10 hs  Hypertension - Continue amlodipine 5 mg daily   DVT prophylaxis: lovenox Code Status: FULL code Family Communication: [name] [phone] called and updated on 9/30 Disposition:  Status is: Inpatient  Remains inpatient appropriate because:Persistent severe electrolyte disturbances, Altered mental status, and IV treatments appropriate due to intensity of illness or  inability to take PO  Dispo: The patient is from: Home              Anticipated d/c is to: SNF when able              Patient currently is not medically stable to d/c.   Difficult to place patient No   Consultants:  Neurology  Procedures:   Antimicrobials:     Subjective:  Mildly agitated overnight--seems re-directable Some breakfast eaten at bedside Slight sleepy compared to prior   Objective: Vitals:   11/09/20 2008 11/10/20 0200 11/10/20 0731 11/10/20 1152  BP: 111/65 136/71 140/65 121/71  Pulse: 96 89 96 91  Resp: 20 (!) 22 20 14   Temp:   97.7 F (36.5 C) 97.8 F (36.6 C)  TempSrc:   Oral Oral  SpO2: 96% 100%      Intake/Output Summary (Last 24 hours) at 11/10/2020 1239 Last data filed at 11/09/2020 1700 Gross per 24 hour  Intake 240 ml  Output --  Net 240 ml    There were no vitals filed for this visit.  Examination:  incoherent  CTA B no added sound no rales no rhonchi S1-S2 tachycardic no murmur no rub no gallop-nsr predominantly Neuro exam deferred as sleepy and unable to cooperate with full exam  Data Reviewed: personally reviewed   CBC    Component Value Date/Time   WBC 7.6 11/09/2020 1315   RBC 3.92 11/09/2020 1315   HGB 12.2 11/09/2020 1315   HCT 38.2 11/09/2020 1315   PLT 160 11/09/2020 1315   MCV 97.4 11/09/2020 1315   MCH 31.1 11/09/2020 1315   MCHC 31.9 11/09/2020 1315   RDW 12.1 11/09/2020 1315   LYMPHSABS 2.5 11/09/2020 0323  MONOABS 0.5 11/09/2020 0323   EOSABS 0.2 11/09/2020 0323   BASOSABS 0.0 11/09/2020 0323   CMP Latest Ref Rng & Units 11/09/2020 11/08/2020 11/07/2020  Glucose 70 - 99 mg/dL 140(H) 180(H) 80  BUN 8 - 23 mg/dL 16 14 13   Creatinine 0.44 - 1.00 mg/dL 1.08(H) 1.04(H) 0.91  Sodium 135 - 145 mmol/L 138 138 140  Potassium 3.5 - 5.1 mmol/L 3.8 3.5 3.5  Chloride 98 - 111 mmol/L 103 104 108  CO2 22 - 32 mmol/L 25 26 26   Calcium 8.9 - 10.3 mg/dL 9.3 9.6 9.4  Total Protein 6.5 - 8.1 g/dL - - 6.1(L)  Total  Bilirubin 0.3 - 1.2 mg/dL - - 0.8  Alkaline Phos 38 - 126 U/L - - 48  AST 15 - 41 U/L - - 36  ALT 0 - 44 U/L - - 16     Radiology Studies: No results found.   Scheduled Meds:   stroke: mapping our early stages of recovery book   Does not apply Once   amLODipine  5 mg Oral Daily   aspirin EC  81 mg Oral Daily   atorvastatin  40 mg Oral Daily   calcium carbonate  1 tablet Oral BID WC   cholecalciferol  1,000 Units Oral Daily   clopidogrel  75 mg Oral Daily   insulin aspart  0-5 Units Subcutaneous QHS   insulin aspart  0-9 Units Subcutaneous TID WC   insulin glargine-yfgn  18 Units Subcutaneous Daily   latanoprost  1 drop Both Eyes QPM   memantine  5 mg Oral Daily   metoprolol succinate  12.5 mg Oral Daily   omega-3 acid ethyl esters  1 g Oral Daily   pantoprazole  40 mg Oral Daily   vitamin B-12  1,000 mcg Oral Daily   Continuous Infusions:     LOS: 4 days   Time spent: 85  [name], MD [facility] To contact the attending provider between 7A-7P or the covering provider during after hours 7P-7A, please log into the web site [email or web address] and access using universal [place] password for that web site. If you do not have the password, please call the hospital operator.  11/10/2020, 12:39 PM

## 2020-11-11 DIAGNOSIS — I639 Cerebral infarction, unspecified: Secondary | ICD-10-CM | POA: Diagnosis not present

## 2020-11-11 LAB — GLUCOSE, CAPILLARY
Glucose-Capillary: 182 mg/dL — ABNORMAL HIGH (ref 70–99)
Glucose-Capillary: 252 mg/dL — ABNORMAL HIGH (ref 70–99)
Glucose-Capillary: 281 mg/dL — ABNORMAL HIGH (ref 70–99)
Glucose-Capillary: 248 mg/dL — ABNORMAL HIGH (ref 70–99)

## 2020-11-11 LAB — COMPREHENSIVE METABOLIC PANEL
ALT: 30 U/L (ref 0–44)
AST: 44 U/L — ABNORMAL HIGH (ref 15–41)
Albumin: 3.1 g/dL — ABNORMAL LOW (ref 3.5–5.0)
Alkaline Phosphatase: 47 U/L (ref 38–126)
Anion gap: 9 (ref 5–15)
BUN: 24 mg/dL — ABNORMAL HIGH (ref 8–23)
CO2: 26 mmol/L (ref 22–32)
Calcium: 9.4 mg/dL (ref 8.9–10.3)
Chloride: 105 mmol/L (ref 98–111)
Creatinine, Ser: 1.14 mg/dL — ABNORMAL HIGH (ref 0.44–1.00)
GFR, Estimated: 48 mL/min — ABNORMAL LOW (ref >60)
Glucose, Bld: 154 mg/dL — ABNORMAL HIGH (ref 70–99)
Potassium: 3.8 mmol/L (ref 3.5–5.1)
Sodium: 140 mmol/L (ref 135–145)
Total Bilirubin: 0.7 mg/dL (ref 0.3–1.2)
Total Protein: 5.9 g/dL — ABNORMAL LOW (ref 6.5–8.1)

## 2020-11-11 MED ORDER — OLANZAPINE 5 MG PO TBDP: 5.0000 mg | ORAL_TABLET | Freq: Every day | ORAL | Status: DC

## 2020-11-11 MED ORDER — HALOPERIDOL 0.5 MG PO TABS: 0.5000 mg | ORAL_TABLET | Freq: Three times a day (TID) | ORAL | Status: DC | PRN

## 2020-11-11 MED ORDER — OLANZAPINE 5 MG PO TBDP
5.0000 mg | ORAL_TABLET | Freq: Every day | ORAL | Status: DC
  Administered 2020-11-11 – 2020-11-13 (×3): 5 mg via ORAL
  Filled 2020-11-11 (×3): qty 1

## 2020-11-11 NOTE — Plan of Care (Signed)
Pt is alert, confused. Pt was restless, agitated, trying to get out of bed. Pt stating her momma was coming. Pt is a high fall risk, pt refused to take her PO haldol. Provider paged to inform and received order for IM haldol x 1, effective.,Pt resting and cooperative with care.   Problem: Education: Goal: Knowledge of General Education information will improve Description: Including pain rating scale, medication(s)/side effects and non-pharmacologic comfort measures Outcome: Progressing   Problem: Health Behavior/Discharge Planning: Goal: Ability to manage health-related needs will improve Outcome: Progressing   Problem: Clinical Measurements: Goal: Ability to maintain clinical measurements within normal limits will improve Outcome: Progressing Goal: Will remain free from infection Outcome: Progressing Goal: Diagnostic test results will improve Outcome: Progressing Goal: Respiratory complications will improve Outcome: Progressing Goal: Cardiovascular complication will be avoided Outcome: Progressing   Problem: Activity: Goal: Risk for activity intolerance will decrease Outcome: Progressing   Problem: Nutrition: Goal: Adequate nutrition will be maintained Outcome: Progressing   Problem: Coping: Goal: Level of anxiety will decrease Outcome: Progressing   Problem: Elimination: Goal: Will not experience complications related to bowel motility Outcome: Progressing Goal: Will not experience complications related to urinary retention Outcome: Progressing   Problem: Pain Managment: Goal: General experience of comfort will improve Outcome: Progressing   Problem: Safety: Goal: Ability to remain free from injury will improve Outcome: Progressing   Problem: Skin Integrity: Goal: Risk for impaired skin integrity will decrease Outcome: Progressing

## 2020-11-11 NOTE — Progress Notes (Signed)
Somewhat PROGRESS NOTE  Stacey Fuentes  XNA:[id number] DOB: [birth year] DOA: 11/06/2020 PCP: Harlan Stains, MD  Brief Narrative:   85 year old black female lives with daughter [name]---ambulatory with cane, dementia since 2017 followed by Dr. Leta Baptist, diabetes mellitus since 1991 followed by Dr. Chalmers Cater, orthostatic hypotension prior seen Dr. Ellyn Hack, CKD 2, benign essential tremors Brought to ED 9/27 increasing lethargy, increased tremors more wobbly and fell backwards into a chair-initially went to [place] [facility]-left because not seen came back to [facility], ED Imaging obtained showing early patchy left SCA stroke-neurology consulted   Hospital-Problem based course  Left SCA stroke - aspirin 81 Plavix 75--will need both for 3 weeks and then discontinue Plavix and aspirin to continue - Continue Lipitor 40 daily - Echo EF 60-65% grd 1 DD -EEG -->diffuse encephalopathy which was nonspecific with no epilepsy, no further work-up and no need for AED  DM TY 2 complicated by nephropathy A1c 8.3 - Lantus increased from 15-18 units [home dose is 30] apparently not on short-acting insulin -CBG 150-180 eating 90-100% of meals   Sinus tachycardia 9/29 -Seen in setting of needing confusion? - Metoprolol cut back to 12.5 XL daily given some pauses, which have resolved -Discontinue cardiac monitor on discharge given stroke  Delirium/dementia since 2017 -impulsive at times with some mild hallucinations - She is somewhat reorientable -Restart low-dose Namenda 5 mg after discussion with family, add low dose haldol 0.5 bid for agitation, add Zyprexa 5 mg pm to see if this helps with sleep -Continue melatonin 10 hs  Hypertension - Continue amlodipine 5 mg daily   DVT prophylaxis: lovenox Code Status: FULL code Family Communication: [name] [phone] called and updated on 10/2 Disposition:  Status is: Inpatient  Remains inpatient appropriate because:Persistent severe  electrolyte disturbances, Altered mental status, and IV treatments appropriate due to intensity of illness or inability to take PO  Dispo: The patient is from: Home              Anticipated d/c is to: SNF when able              Patient currently is not medically stable to d/c.   Difficult to place patient No   Consultants:  Neurology  Procedures:   Antimicrobials:     Subjective:  Sleepy Events noted overnight Received Haldol this morning still sleepy and cannot interact as much as previously   Objective: Vitals:   11/10/20 2003 11/11/20 0009 11/11/20 0438 11/11/20 0730  BP: 107/67 112/61 (!) 105/55 123/76  Pulse: 88 82 95 88  Resp: 16 17 18    Temp: 98.3 F (36.8 C) 98.2 F (36.8 C) 98.5 F (36.9 C) 98.1 F (36.7 C)  TempSrc: Axillary Oral Oral Oral  SpO2: 98% 99% 100% 97%    Intake/Output Summary (Last 24 hours) at 11/11/2020 2831 Last data filed at 11/11/2020 0900 Gross per 24 hour  Intake 290 ml  Output --  Net 290 ml    There were no vitals filed for this visit.  Examination:  Sleepy this morning Chest clear no added sound Sinus rhythm seems to be regular rate rhythm with no arrhythmia Abdomen soft Rest of exam deferred as unable to cooperate  Data Reviewed: personally reviewed   CBC    Component Value Date/Time   WBC 7.6 11/09/2020 1315   RBC 3.92 11/09/2020 1315   HGB 12.2 11/09/2020 1315   HCT 38.2 11/09/2020 1315   PLT 160 11/09/2020 1315   MCV 97.4 11/09/2020 1315   MCH 31.1  11/09/2020 1315   MCHC 31.9 11/09/2020 1315   RDW 12.1 11/09/2020 1315   LYMPHSABS 2.5 11/09/2020 0323   MONOABS 0.5 11/09/2020 0323   EOSABS 0.2 11/09/2020 0323   BASOSABS 0.0 11/09/2020 0323   CMP Latest Ref Rng & Units 11/11/2020 11/09/2020 11/08/2020  Glucose 70 - 99 mg/dL 154(H) 140(H) 180(H)  BUN 8 - 23 mg/dL 24(H) 16 14  Creatinine 0.44 - 1.00 mg/dL 1.14(H) 1.08(H) 1.04(H)  Sodium 135 - 145 mmol/L 140 138 138  Potassium 3.5 - 5.1 mmol/L 3.8 3.8 3.5   Chloride 98 - 111 mmol/L 105 103 104  CO2 22 - 32 mmol/L 26 25 26   Calcium 8.9 - 10.3 mg/dL 9.4 9.3 9.6  Total Protein 6.5 - 8.1 g/dL 5.9(L) - -  Total Bilirubin 0.3 - 1.2 mg/dL 0.7 - -  Alkaline Phos 38 - 126 U/L 47 - -  AST 15 - 41 U/L 44(H) - -  ALT 0 - 44 U/L 30 - -     Radiology Studies: No results found.   Scheduled Meds:   stroke: mapping our early stages of recovery book   Does not apply Once   amLODipine  5 mg Oral Daily   aspirin EC  81 mg Oral Daily   atorvastatin  40 mg Oral Daily   calcium carbonate  1 tablet Oral BID WC   cholecalciferol  1,000 Units Oral Daily   clopidogrel  75 mg Oral Daily   insulin aspart  0-5 Units Subcutaneous QHS   insulin aspart  0-9 Units Subcutaneous TID WC   insulin glargine-yfgn  18 Units Subcutaneous Daily   latanoprost  1 drop Both Eyes QPM   memantine  5 mg Oral Daily   metoprolol succinate  12.5 mg Oral Daily   OLANZapine zydis  5 mg Oral QHS   omega-3 acid ethyl esters  1 g Oral Daily   pantoprazole  40 mg Oral Daily   vitamin B-12  1,000 mcg Oral Daily   Continuous Infusions:     LOS: 5 days   Time spent: 53  [name], MD [facility] To contact the attending provider between 7A-7P or the covering provider during after hours 7P-7A, please log into the web site [email or web address] and access using universal [place] password for that web site. If you do not have the password, please call the hospital operator.  11/11/2020, 9:28 AM

## 2020-11-11 NOTE — Progress Notes (Signed)
Patient refused her morning medication, MD aware, will try later

## 2020-11-12 DIAGNOSIS — I639 Cerebral infarction, unspecified: Secondary | ICD-10-CM | POA: Diagnosis not present

## 2020-11-12 LAB — GLUCOSE, CAPILLARY
Glucose-Capillary: 181 mg/dL — ABNORMAL HIGH (ref 70–99)
Glucose-Capillary: 377 mg/dL — ABNORMAL HIGH (ref 70–99)
Glucose-Capillary: 117 mg/dL — ABNORMAL HIGH (ref 70–99)
Glucose-Capillary: 125 mg/dL — ABNORMAL HIGH (ref 70–99)

## 2020-11-12 NOTE — Plan of Care (Signed)
Pt is alert oriented x 1, pt rested last night, did attempt to get out oob but redirected. Pt denies pain. Encouraged fluids. Pt drinks small sips at a time.   Problem: Education: Goal: Knowledge of General Education information will improve Description: Including pain rating scale, medication(s)/side effects and non-pharmacologic comfort measures Outcome: Progressing   Problem: Health Behavior/Discharge Planning: Goal: Ability to manage health-related needs will improve Outcome: Progressing   Problem: Clinical Measurements: Goal: Ability to maintain clinical measurements within normal limits will improve Outcome: Progressing Goal: Will remain free from infection Outcome: Progressing Goal: Diagnostic test results will improve Outcome: Progressing Goal: Respiratory complications will improve Outcome: Progressing Goal: Cardiovascular complication will be avoided Outcome: Progressing   Problem: Activity: Goal: Risk for activity intolerance will decrease Outcome: Progressing   Problem: Nutrition: Goal: Adequate nutrition will be maintained Outcome: Progressing   Problem: Coping: Goal: Level of anxiety will decrease Outcome: Progressing   Problem: Elimination: Goal: Will not experience complications related to bowel motility Outcome: Progressing Goal: Will not experience complications related to urinary retention Outcome: Progressing   Problem: Pain Managment: Goal: General experience of comfort will improve Outcome: Progressing   Problem: Safety: Goal: Ability to remain free from injury will improve Outcome: Progressing   Problem: Skin Integrity: Goal: Risk for impaired skin integrity will decrease Outcome: Progressing

## 2020-11-12 NOTE — Progress Notes (Signed)
Pt refusing some of morning meds. MD notified. Will re attempt at later time.  Gwendolyn Grant, RN

## 2020-11-12 NOTE — Progress Notes (Signed)
Inpatient Diabetes Program Recommendations  AACE/ADA: New Consensus Statement on Inpatient Glycemic Control (2015)  Target Ranges:  Prepandial:   less than 140 mg/dL      Peak postprandial:   less than 180 mg/dL (1-2 hours)      Critically ill patients:  140 - 180 mg/dL   Lab Results  Component Value Date   GLUCAP 181 (H) 11/12/2020   HGBA1C 8.3 (H) 11/07/2020    Review of Glycemic Control Results for Stacey Fuentes, Stacey Fuentes (MRN [id number]) as of 11/12/2020 11:00  Ref. Range 11/11/2020 11:22 11/11/2020 16:19 11/11/2020 20:39 11/12/2020 06:00  Glucose-Capillary Latest Ref Range: 70 - 99 mg/dL 252 (H) 281 (H) 248 (H) 181 (H)   Diabetes history: Type 2 DM Outpatient Diabetes medications: Lantus 30 units QHS Current orders for Inpatient glycemic control: Semglee 18 units QD, Novolog 0-9 units TID & HS  Inpatient Diabetes Program Recommendations:    If to remain inpatient, consider increasing Semglee to 22 units QD.   Thanks, Bronson Curb, MSN, RNC-OB Diabetes Coordinator [phone] (8a-5p)

## 2020-11-12 NOTE — Progress Notes (Signed)
Somewhat PROGRESS NOTE  Stacey Fuentes  JYN:[id number] DOB: 09/05/1935 DOA: 11/06/2020 PCP: Harlan Stains, MD  Brief Narrative:   85 year old black female lives with daughter Stacey Fuentes---ambulatory with cane, dementia since 2017 followed by Dr. Leta Baptist, diabetes mellitus since 1991 followed by Dr. Chalmers Cater, orthostatic hypotension prior seen Dr. Ellyn Hack, CKD 2, benign essential tremors Brought to ED 9/27 increasing lethargy, increased tremors more wobbly and fell backwards into a chair-initially went to [facility]-left because not seen came back to [facility], ED Imaging obtained showing early patchy left SCA stroke-neurology consulted  Hospital-Problem based course  Left SCA stroke - aspirin 81 Plavix 75--will need both for 3 weeks and then discontinue Plavix and aspirin to continue - Continue Lipitor 40 daily - Echo EF 60-65% grd 1 DD -EEG -->diffuse encephalopathy which was nonspecific with no epilepsy, no further work-up and no need for AED  DM TY 2 complicated by nephropathy A1c 8.3 - Lantus increased from 15-18 units [home dose is 30] apparently not on short-acting insulin -CBG 181-370 eating 50% of meals   Sinus tachycardia 9/29 -Seen in setting of needing confusion? - Metoprolol cut back to 12.5 XL daily 2/2 pauses, which have resolved -Discontinue cardiac monitor as patient pulling at it  Delirium/dementia since 2017 -impulsive at times with some mild hallucinations -refusing some meds at times as well - She is somewhat reorientable -Restart low-dose Namenda 5 mg after discussion with family, add low dose haldol 0.5 bid for agitation, add Zyprexa 5 mg pm to see if this helps with sleep -Continue melatonin 10 hs  Hypertension - Continue amlodipine 5 mg daily   DVT prophylaxis: lovenox Code Status: FULL code Family Communication: [name] [phone] called and updated on 10/2 Disposition:  Status is: Inpatient  Remains inpatient appropriate  because:Persistent severe electrolyte disturbances, Altered mental status, and IV treatments appropriate due to intensity of illness or inability to take PO  Dispo: The patient is from: Home              Anticipated d/c is to: SNF when able              Patient currently is not medically stable to d/c.   Difficult to place patient No   Consultants:  Neurology  Procedures:   Antimicrobials:     Subjective:  Awake  Interactive but confused No cp fever chills Refusing some meds   Objective: Vitals:   11/12/20 0037 11/12/20 0428 11/12/20 0924 11/12/20 1200  BP: 133/71 (!) 141/76 (!) 141/69 115/72  Pulse: 73 82 88 94  Resp: 18 18 16 18   Temp: (!) 97.4 F (36.3 C) 97.6 F (36.4 C) (!) 97.4 F (36.3 C) (!) 97.5 F (36.4 C)  TempSrc: Oral Oral Axillary Oral  SpO2: 97% 100% 98% 98%    Intake/Output Summary (Last 24 hours) at 11/12/2020 1501 Last data filed at 11/12/2020 9528 Gross per 24 hour  Intake 420 ml  Output --  Net 420 ml    There were no vitals filed for this visit.  Examination:  Confused awake Chest clear no added sound Sinus rhythm , RRR Abdomen soft Able to lift arms above bed lifting legs no deficit  Data Reviewed: personally reviewed   CBC    Component Value Date/Time   WBC 7.6 11/09/2020 1315   RBC 3.92 11/09/2020 1315   HGB 12.2 11/09/2020 1315   HCT 38.2 11/09/2020 1315   PLT 160 11/09/2020 1315   MCV 97.4 11/09/2020 1315   MCH 31.1 11/09/2020  1315   MCHC 31.9 11/09/2020 1315   RDW 12.1 11/09/2020 1315   LYMPHSABS 2.5 11/09/2020 0323   MONOABS 0.5 11/09/2020 0323   EOSABS 0.2 11/09/2020 0323   BASOSABS 0.0 11/09/2020 0323   CMP Latest Ref Rng & Units 11/11/2020 11/09/2020 11/08/2020  Glucose 70 - 99 mg/dL 154(H) 140(H) 180(H)  BUN 8 - 23 mg/dL 24(H) 16 14  Creatinine 0.44 - 1.00 mg/dL 1.14(H) 1.08(H) 1.04(H)  Sodium 135 - 145 mmol/L 140 138 138  Potassium 3.5 - 5.1 mmol/L 3.8 3.8 3.5  Chloride 98 - 111 mmol/L 105 103 104  CO2 22 -  32 mmol/L 26 25 26   Calcium 8.9 - 10.3 mg/dL 9.4 9.3 9.6  Total Protein 6.5 - 8.1 g/dL 5.9(L) - -  Total Bilirubin 0.3 - 1.2 mg/dL 0.7 - -  Alkaline Phos 38 - 126 U/L 47 - -  AST 15 - 41 U/L 44(H) - -  ALT 0 - 44 U/L 30 - -     Radiology Studies: No results found.   Scheduled Meds:   stroke: mapping our early stages of recovery book   Does not apply Once   amLODipine  5 mg Oral Daily   aspirin EC  81 mg Oral Daily   atorvastatin  40 mg Oral Daily   calcium carbonate  1 tablet Oral BID WC   cholecalciferol  1,000 Units Oral Daily   clopidogrel  75 mg Oral Daily   insulin aspart  0-5 Units Subcutaneous QHS   insulin aspart  0-9 Units Subcutaneous TID WC   insulin glargine-yfgn  18 Units Subcutaneous Daily   latanoprost  1 drop Both Eyes QPM   memantine  5 mg Oral Daily   metoprolol succinate  12.5 mg Oral Daily   OLANZapine zydis  5 mg Oral q1600   omega-3 acid ethyl esters  1 g Oral Daily   pantoprazole  40 mg Oral Daily   vitamin B-12  1,000 mcg Oral Daily   Continuous Infusions:     LOS: 6 days   Time spent: 15  [name], MD [facility] To contact the attending provider between 7A-7P or the covering provider during after hours 7P-7A, please log into the web site [email or web address] and access using universal [place] password for that web site. If you do not have the password, please call the hospital operator.  11/12/2020, 3:01 PM

## 2020-11-12 NOTE — Progress Notes (Signed)
Physical Therapy Treatment Patient Details Name: Stacey Fuentes MRN: [id number] DOB: 1935/07/06 Today's Date: 11/12/2020   History of Present Illness Pt is an 85 y/o female admitted 9/27 secondary to worsening weakness and ambulatory function. Imaging showed infarct in cerebellar vermis and L cerebellar hemisphere. PMH includes dementia, HTN, CKD, DM, osteoporosis, and tremors.    PT Comments    Pt in bed long sitting on arrival. She required min assist to transition to sitting EOB, and mod assist sit to stand. Pt immediately returned to sit after first standing trial. Declining further mobility attempts, simply stating "no, I'm not going to." Offered pt choices, i.e. going to sink to wash her face, sitting in recliner. Pt declined all choices.  She demonstrated fair sitting balance. Min assist required for return to supine in bed.     Recommendations for follow up therapy are one component of a multi-disciplinary discharge planning process, led by the attending physician.  Recommendations may be updated based on patient status, additional functional criteria and insurance authorization.  Follow Up Recommendations  SNF;Supervision/Assistance - 24 hour;Supervision for mobility/OOB     Equipment Recommendations  Wheelchair cushion (measurements PT);Wheelchair (measurements PT);Other (comment)    Recommendations for Other Services       Precautions / Restrictions Precautions Precautions: Fall Restrictions Weight Bearing Restrictions: No     Mobility  Bed Mobility Overal bed mobility: Needs Assistance Bed Mobility: Supine to Sit;Sit to Supine;Rolling Rolling: Modified independent (Device/Increase time)   Supine to sit: Min assist;HOB elevated Sit to supine: Min assist   General bed mobility comments: Pt long sitting in bed on arrival. Assist to transition to/from EOB. Multi modal cues for sequencing.    Transfers Overall transfer level: Needs assistance Equipment used: 1  person hand held assist Transfers: Sit to/from Stand Sit to Stand: Mod assist         General transfer comment: mod assist to power up. Pt declining progressing beyond standing bedside and returned to sit.  Ambulation/Gait                 Stairs             Wheelchair Mobility    Modified Rankin (Stroke Patients Only) Modified Rankin (Stroke Patients Only) Pre-Morbid Rankin Score: Slight disability Modified Rankin: Moderately severe disability     Balance Overall balance assessment: Needs assistance Sitting-balance support: No upper extremity supported;Feet supported Sitting balance-Leahy Scale: Fair     Standing balance support: Bilateral upper extremity supported;During functional activity;Single extremity supported Standing balance-Leahy Scale: Poor Standing balance comment: reliant on external support                            Cognition Arousal/Alertness: Awake/alert Behavior During Therapy: Flat affect Overall Cognitive Status: History of cognitive impairments - at baseline                                 General Comments: dementia at baseline. Difficult to redirect today.      Exercises      General Comments        Pertinent Vitals/Pain Pain Assessment: Faces Faces Pain Scale: Hurts a little bit Pain Location: back Pain Descriptors / Indicators: Grimacing Pain Intervention(s): Monitored during session    Home Living                      Prior Function  PT Goals (current goals can now be found in the care plan section) Acute Rehab PT Goals Patient Stated Goal: not stated Progress towards PT goals: Progressing toward goals    Frequency    Min 3X/week      PT Plan Current plan remains appropriate    Co-evaluation              AM-PAC PT "6 Clicks" Mobility   Outcome Measure  Help needed turning from your back to your side while in a flat bed without using bedrails?: A  Little Help needed moving from lying on your back to sitting on the side of a flat bed without using bedrails?: A Little Help needed moving to and from a bed to a chair (including a wheelchair)?: A Lot Help needed standing up from a chair using your arms (e.g., wheelchair or bedside chair)?: A Lot Help needed to walk in hospital room?: Total Help needed climbing 3-5 steps with a railing? : Total 6 Click Score: 12    End of Session Equipment Utilized During Treatment: Gait belt Activity Tolerance: Other (comment) (dementia, declining participation) Patient left: in bed;with call bell/phone within reach;with bed alarm set Nurse Communication: Mobility status PT Visit Diagnosis: Unsteadiness on feet (R26.81);Muscle weakness (generalized) (M62.81);Difficulty in walking, not elsewhere classified (R26.2);Ataxic gait (R26.0)     Time: 3709-6438 PT Time Calculation (min) (ACUTE ONLY): 11 min  Charges:  $Therapeutic Activity: 8-22 mins                     Lorrin Goodell, PT  Office # [phone] Pager [phone]    Lorriane Shire 11/12/2020, 11:52 AM

## 2020-11-13 DIAGNOSIS — I639 Cerebral infarction, unspecified: Secondary | ICD-10-CM | POA: Diagnosis not present

## 2020-11-13 LAB — GLUCOSE, CAPILLARY
Glucose-Capillary: 138 mg/dL — ABNORMAL HIGH (ref 70–99)
Glucose-Capillary: 133 mg/dL — ABNORMAL HIGH (ref 70–99)
Glucose-Capillary: 198 mg/dL — ABNORMAL HIGH (ref 70–99)
Glucose-Capillary: 124 mg/dL — ABNORMAL HIGH (ref 70–99)
Glucose-Capillary: 260 mg/dL — ABNORMAL HIGH (ref 70–99)

## 2020-11-13 LAB — RESP PANEL BY RT-PCR (FLU A&B, COVID) ARPGX2
Influenza A by PCR: NEGATIVE
Influenza B by PCR: NEGATIVE
SARS Coronavirus 2 by RT PCR: NEGATIVE

## 2020-11-13 MED ORDER — CLOPIDOGREL BISULFATE 75 MG PO TABS: 75.0000 mg | ORAL_TABLET | Freq: Every day | ORAL | Status: DC

## 2020-11-13 MED ORDER — ATORVASTATIN CALCIUM 40 MG PO TABS: 40.0000 mg | ORAL_TABLET | Freq: Every day | ORAL | Status: DC

## 2020-11-13 MED ORDER — INSULIN GLARGINE-YFGN 100 UNIT/ML ~~LOC~~ SOLN
10.0000 [IU] | Freq: Every day | SUBCUTANEOUS | Status: DC
  Filled 2020-11-13: qty 0.1

## 2020-11-13 MED ORDER — METOPROLOL SUCCINATE ER 25 MG PO TB24: 12.5000 mg | ORAL_TABLET | Freq: Every day | ORAL | 0 refills | Status: DC

## 2020-11-13 MED ORDER — MEMANTINE HCL 5 MG PO TABS: 5.0000 mg | ORAL_TABLET | Freq: Every day | ORAL | 0 refills | Status: DC

## 2020-11-13 MED ORDER — OLANZAPINE 5 MG PO TBDP: 5.0000 mg | ORAL_TABLET | Freq: Every day | ORAL | 0 refills | Status: DC

## 2020-11-13 MED ORDER — ASPIRIN 81 MG PO TABS: 81.0000 mg | ORAL_TABLET | Freq: Every day | ORAL | Status: DC

## 2020-11-13 MED ORDER — INSULIN GLARGINE-YFGN 100 UNIT/ML ~~LOC~~ SOLN: 18.0000 [IU] | Freq: Every day | SUBCUTANEOUS | 11 refills | Status: DC

## 2020-11-13 MED ORDER — HALOPERIDOL 0.5 MG PO TABS: 0.5000 mg | ORAL_TABLET | Freq: Three times a day (TID) | ORAL | 0 refills | Status: DC | PRN

## 2020-11-13 MED ORDER — INSULIN GLARGINE-YFGN 100 UNIT/ML ~~LOC~~ SOLN: 10.0000 [IU] | Freq: Every day | SUBCUTANEOUS | 11 refills | Status: DC

## 2020-11-13 NOTE — Progress Notes (Signed)
Occupational Therapy Treatment Patient Details Name: Stacey Fuentes MRN: [id number] DOB: 12-27-1935 Today's Date: 11/13/2020   History of present illness Pt is an 85 y/o female admitted 9/27 secondary to worsening weakness and ambulatory function. Imaging showed infarct in cerebellar vermis and L cerebellar hemisphere. PMH includes dementia, HTN, CKD, DM, osteoporosis, and tremors.   OT comments  Pt is incrementally progressing; she continues to be limited by baseline dementia and L sided weakness. Pt was max A for bed mobility, total A for sit<>stand and max A for stand pivot transfer. Pt did not follow simple commands this session, total A for bilat socks and to turn head to the L or attend to anything in the environment on the L side. Pt continues to benefit from OT acutely. D/c recommendation remains appropriate.    Recommendations for follow up therapy are one component of a multi-disciplinary discharge planning process, led by the attending physician.  Recommendations may be updated based on patient status, additional functional criteria and insurance authorization.    Follow Up Recommendations  SNF    Equipment Recommendations  3 in 1 bedside commode       Precautions / Restrictions Precautions Precautions: Fall Restrictions Weight Bearing Restrictions: No       Mobility Bed Mobility Overal bed mobility: Needs Assistance Bed Mobility: Supine to Sit     Supine to sit: Max assist     General bed mobility comments: max A for physical assist, pt not following commands for sequencing; able to assist by pushing through elbow    Transfers Overall transfer level: Needs assistance Equipment used: 1 person hand held assist Transfers: Sit to/from Stand;Stand Pivot Transfers Sit to Stand: Total assist Stand pivot transfers: Max assist       General transfer comment: total for sit<>stand, once standing pt putting weigth through BLE, max A for weight shifting during  transfer    Balance Overall balance assessment: Needs assistance Sitting-balance support: No upper extremity supported;Feet supported Sitting balance-Leahy Scale: Fair Sitting balance - Comments: L lateral lean   Standing balance support: Bilateral upper extremity supported Standing balance-Leahy Scale: Zero Standing balance comment: reliant on therapist                           ADL either performed or assessed with clinical judgement   ADL Overall ADL's : Needs assistance/impaired                         Toilet Transfer: Maximal assistance;Stand-pivot;BSC Toilet Transfer Details (indicate cue type and reason): simulated         Functional mobility during ADLs: Maximal assistance;Cueing for safety;Cueing for sequencing General ADL Comments: pt is limited by imapired cognition, poor attention to task and unable to follow comands. Requried max A fot cit<>stand and stand pivot from bed>chair     Vision       Perception     Praxis      Cognition Arousal/Alertness: Awake/alert Behavior During Therapy: Flat affect Overall Cognitive Status: History of cognitive impairments - at baseline                                 General Comments: dementia at baseline, followed 0% of commands        Exercises     Shoulder Instructions       General Comments VSS on RA  Pertinent Vitals/ Pain       Pain Assessment: Faces Faces Pain Scale: Hurts a little bit Pain Location: generalized with movement Pain Descriptors / Indicators: Grimacing Pain Intervention(s): Limited activity within patient's tolerance;Monitored during session;Repositioned  Home Living                                          Prior Functioning/Environment              Frequency  Min 2X/week        Progress Toward Goals  OT Goals(current goals can now be found in the care plan section)  Progress towards OT goals: Progressing toward  goals  Acute Rehab OT Goals Patient Stated Goal: not stated OT Goal Formulation: With patient/family Time For Goal Achievement: 11/21/20 Potential to Achieve Goals: Good ADL Goals Pt Will Perform Eating: with set-up;sitting;with adaptive utensils;bed level Pt Will Perform Grooming: with min assist;sitting Pt Will Perform Upper Body Bathing: with min assist;sitting Pt Will Perform Lower Body Bathing: with mod assist;sit to/from stand Pt Will Transfer to Toilet: with mod assist;stand pivot transfer;bedside commode Pt Will Perform Toileting - Clothing Manipulation and hygiene: with mod assist;sit to/from stand  Plan Discharge plan needs to be updated    Co-evaluation                 AM-PAC OT "6 Clicks" Daily Activity     Outcome Measure   Help from another person eating meals?: A Little Help from another person taking care of personal grooming?: A Lot Help from another person toileting, which includes using toliet, bedpan, or urinal?: A Lot Help from another person bathing (including washing, rinsing, drying)?: A Lot Help from another person to put on and taking off regular upper body clothing?: A Lot Help from another person to put on and taking off regular lower body clothing?: Total 6 Click Score: 12    End of Session Equipment Utilized During Treatment: Gait belt  OT Visit Diagnosis: Unsteadiness on feet (R26.81);Cognitive communication deficit (R41.841) Symptoms and signs involving cognitive functions: Cerebral infarction   Activity Tolerance Patient tolerated treatment well   Patient Left in chair;with call bell/phone within reach   Nurse Communication Precautions;Weight bearing status;Mobility status        Time: 3845-3646 OT Time Calculation (min): 18 min  Charges: OT General Charges $OT Visit: 1 Visit OT Treatments $Therapeutic Activity: 8-22 mins   Carolanne Mercier A Jodilyn Giese 11/13/2020, 10:09 AM

## 2020-11-13 NOTE — Plan of Care (Signed)
  Problem: Education: Goal: Knowledge of General Education information will improve Description: Including pain rating scale, medication(s)/side effects and non-pharmacologic comfort measures 11/13/2020 1445 by Cassell Smiles, RN Outcome: Adequate for Discharge 11/13/2020 1445 by Cassell Smiles, RN Outcome: Progressing   Problem: Clinical Measurements: Goal: Ability to maintain clinical measurements within normal limits will improve 11/13/2020 1445 by Cassell Smiles, RN Outcome: Adequate for Discharge 11/13/2020 1445 by Cassell Smiles, RN Outcome: Progressing Goal: Will remain free from infection 11/13/2020 1445 by Cassell Smiles, RN Outcome: Adequate for Discharge 11/13/2020 1445 by Cassell Smiles, RN Outcome: Progressing Goal: Diagnostic test results will improve 11/13/2020 1445 by Cassell Smiles, RN Outcome: Adequate for Discharge 11/13/2020 1445 by Cassell Smiles, RN Outcome: Progressing Goal: Respiratory complications will improve 11/13/2020 1445 by Cassell Smiles, RN Outcome: Adequate for Discharge 11/13/2020 1445 by Cassell Smiles, RN Outcome: Progressing Goal: Cardiovascular complication will be avoided 11/13/2020 1445 by Cassell Smiles, RN Outcome: Adequate for Discharge 11/13/2020 1445 by Cassell Smiles, RN Outcome: Progressing   Problem: Activity: Goal: Risk for activity intolerance will decrease 11/13/2020 1445 by Cassell Smiles, RN Outcome: Adequate for Discharge 11/13/2020 1445 by Cassell Smiles, RN Outcome: Progressing   Problem: Nutrition: Goal: Adequate nutrition will be maintained 11/13/2020 1445 by Cassell Smiles, RN Outcome: Adequate for Discharge 11/13/2020 1445 by Cassell Smiles, RN Outcome: Progressing   Problem: Coping: Goal: Level of anxiety will decrease 11/13/2020 1445 by Cassell Smiles, RN Outcome: Adequate for Discharge 11/13/2020 1445 by Cassell Smiles, RN Outcome: Progressing   Problem: Elimination: Goal: Will  not experience complications related to bowel motility 11/13/2020 1445 by Cassell Smiles, RN Outcome: Adequate for Discharge 11/13/2020 1445 by Cassell Smiles, RN Outcome: Progressing Goal: Will not experience complications related to urinary retention 11/13/2020 1445 by Cassell Smiles, RN Outcome: Adequate for Discharge 11/13/2020 1445 by Cassell Smiles, RN Outcome: Progressing   Problem: Pain Managment: Goal: General experience of comfort will improve 11/13/2020 1445 by Cassell Smiles, RN Outcome: Adequate for Discharge 11/13/2020 1445 by Cassell Smiles, RN Outcome: Progressing   Problem: Safety: Goal: Ability to remain free from injury will improve 11/13/2020 1445 by Cassell Smiles, RN Outcome: Adequate for Discharge 11/13/2020 1445 by Cassell Smiles, RN Outcome: Progressing   Problem: Skin Integrity: Goal: Risk for impaired skin integrity will decrease 11/13/2020 1445 by Cassell Smiles, RN Outcome: Adequate for Discharge 11/13/2020 1445 by Cassell Smiles, RN Outcome: Progressing

## 2020-11-13 NOTE — Discharge Summary (Addendum)
Physician Discharge Summary  Stacey Fuentes MCN:[id number] DOB: 1935-08-20 DOA: 11/06/2020  PCP: Harlan Stains, MD  Admit date: 11/06/2020 Discharge date: 11/13/2020  Time spent: 40 minutes  Recommendations for Outpatient Follow-up:  Check CBC Chem-12 in about 1 week See [facility] for recommendations and note regarding aspirin Plavix Adjust insulin as needed only on long-acting may require sliding scale in addition New medications Namenda Zyprexa Haldol this admission   Discharge Diagnoses:  MAIN problem for hospitalization   Left MCA stroke in the setting of dementia  Please see below for itemized issues addressed in [place]- refer to other progress notes for clarity if needed  Discharge Condition: Fair  Diet recommendation: Diabetic heart healthy  There were no vitals filed for this visit.  History of present illness:     Hospital Course:  85 year old black female lives with daughter Stacey Fuentes---ambulatory with cane, dementia since 2017 followed by Dr. Leta Baptist, diabetes mellitus since 1991 followed by Dr. Chalmers Cater, orthostatic hypotension prior seen Dr. Ellyn Hack, CKD 2, benign essential tremors Brought to ED 9/27 increasing lethargy, increased tremors more wobbly and fell backwards into a chair-initially went [facility]-left because not seen came back to [facility], ED Imaging obtained showing early patchy left SCA stroke-neurology consulted  Left SCA stroke - aspirin 81 Plavix 75--will need both for 3 weeks and then discontinue Plavix on around 11/26/2020-- aspirin to continue indefinitely - Continue Lipitor 40 daily - Echo EF 60-65% grd 1 DD -EEG -->diffuse encephalopathy which was nonspecific with no epilepsy, no further work-up and no need for AED   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 - Metoprolol cut back to 12.5 XL daily 2/2 pauses, which have resolved   Delirium/dementia since 2017 -impulsive at times with some mild hallucinations -refusing some meds at times as well - She is somewhat reorientable -Restart low-dose Namenda 5 mg after discussion with family, add low dose haldol 0.5 bid for agitation, add Zyprexa 5 mg pm to see if this helps with sleep -Continue melatonin 10 hs   Hypertension - Continue amlodipine 5 mg daily    Consultations: Neurology  Discharge Exam: Vitals:   11/13/20 0448 11/13/20 0810  BP: (!) 99/58 123/72  Pulse: 88 89  Resp: 16 16  Temp: 97.8 F (36.6 C) 98 F (36.7 C)  SpO2: 98% 100%    Subj on day of d/c   Awake confused to some degree Is able to follow commands  General Exam on discharge  EOMI NCAT no focal deficit No rales rhonchi moving all 4 limbs equally but inconsistently with command ROM intact CTA B no added sound No lower extremity edema  Discharge Instructions   Discharge Instructions     Ambulatory referral to Neurology   Complete by: As directed    An appointment is requested in approximately: 4 weeks s/p stroke.   Diet - low sodium heart healthy   Complete by: As directed    Increase activity slowly   Complete by: As directed       Allergies as of 11/13/2020       Reactions   Metformin And Related Nausea Only   Saxagliptin Nausea Only        Medication List     STOP taking these medications    ibuprofen  600 MG tablet Commonly known as: ADVIL   Lantus SoloStar 100 UNIT/ML Solostar Pen Generic drug: insulin glargine       TAKE these medications    Accu-Chek Softclix Lancets lancets Use to check blood sugars twice daily   amLODipine 5 MG tablet Commonly known as: NORVASC Take 1 tablet (5 mg total) by mouth daily.   aspirin 81 MG tablet Take 1 tablet (81 mg total) by mouth daily.   atorvastatin 40 MG tablet Commonly known as: LIPITOR Take 1  tablet (40 mg total) by mouth daily.   calcium carbonate 200 MG capsule Take 1 capsule by mouth as directed   clopidogrel 75 MG tablet Commonly known as: PLAVIX Take 1 tablet (75 mg total) by mouth daily.   esomeprazole 40 MG capsule Commonly known as: NEXIUM Take 1 capsule (40 mg total) by mouth daily.   famotidine 20 MG tablet Commonly known as: Pepcid Take 1 tablet (20 mg total) by mouth 2 (two) times daily as needed for heartburn or indigestion.   Fish Oil 1000 MG Caps Take 1 capsule by mouth daily.   glucose blood test strip Commonly known as: Accu-Chek Aviva Use to check blood sugars twice daily   haloperidol 0.5 MG tablet Commonly known as: HALDOL Take 1 tablet (0.5 mg total) by mouth every 8 (eight) hours as needed for agitation (agitation).   insulin glargine-yfgn 100 UNIT/ML injection Commonly known as: SEMGLEE Inject 0.1 mLs (10 Units total) into the skin daily. Start taking on: November 14, 2020   Insulin Pen Needle 32G X 4 MM Misc Commonly known as: CareFine Pen Needles Use 4x a day   latanoprost 0.005 % ophthalmic solution Commonly known as: XALATAN Place 1 drop into both eyes every evening.   Melatonin 10 MG Tabs Take 1 tablet by mouth at bedtime.   memantine 5 MG tablet Commonly known as: NAMENDA Take 1 tablet (5 mg total) by mouth daily.   metoprolol succinate 25 MG 24 hr tablet Commonly known as: TOPROL-XL Take 0.5 tablets (12.5 mg total) by mouth daily.   multivitamin with minerals Tabs tablet Take 1 tablet by mouth daily.   OLANZapine zydis 5 MG disintegrating tablet Commonly known as: ZYPREXA Take 1 tablet (5 mg total) by mouth daily at 4 PM.   vitamin B-12 1000 MCG tablet Commonly known as: CYANOCOBALAMIN Take 1,000 mcg by mouth daily.   VITAMIN D3 PO Take 1,000 mg by mouth daily.       Allergies  Allergen Reactions   Metformin And Related Nausea Only   Saxagliptin Nausea Only      The results of significant diagnostics  from this hospitalization (including imaging, microbiology, ancillary and laboratory) are listed below for reference.    Significant Diagnostic Studies: CT ANGIO HEAD NECK W WO CM  Result Date: 11/06/2020 CLINICAL DATA:  Ataxia, stroke suspected EXAM: CT ANGIOGRAPHY HEAD AND NECK TECHNIQUE: Multidetector CT imaging of the head and neck was performed using the standard protocol during bolus administration of intravenous contrast. Multiplanar CT image reconstructions and MIPs were obtained to evaluate the vascular anatomy. Carotid stenosis measurements (when applicable) are obtained utilizing NASCET criteria, using the distal internal carotid diameter as the denominator. CONTRAST:  25mL OMNIPAQUE IOHEXOL 350 MG/ML SOLN COMPARISON:  CT Head 11/05/2020.  Same day MRI. FINDINGS: CT HEAD FINDINGS Brain: Known acute infarcts in the cerebellar vermis and left cerebellar hemisphere are not well characterized by CT. No acute hemorrhage or mass effect. Redemonstrated moderate to advanced chronic microvascular ischemic  disease and atrophy with disproportionate right hippocampal volume loss. No hydrocephalus, midline shift, mass lesion, or extra-axial fluid collection. Vascular: See below. Skull: No acute fracture. Sinuses: Visualized sinuses are clear. Orbits: No acute findings. Review of the MIP images confirms the above findings CTA NECK FINDINGS Aortic arch: Great vessel origins are patent.  Atherosclerosis. Right carotid system: Atherosclerosis of the common carotid artery with approximately 40% stenosis distally. Mixed calcific and noncalcific atherosclerosis at the carotid bifurcation without greater than 50% stenosis relative to the distal vessel. Retropharyngeal course. Left carotid system: Atherosclerosis at the carotid bifurcation without greater than 50% stenosis relative to the distal vessel. Vertebral arteries: Right dominant. The left vertebral artery is small throughout its course. No evidence of significant  (greater than 50%) stenosis. Skeleton: Moderate multilevel degenerative disc disease. Other neck: No acute abnormality. Upper chest: Mild dependent ground-glass opacities, likely atelectasis. Otherwise, clear visualized lung apices. Review of the MIP images confirms the above findings CTA HEAD FINDINGS Anterior circulation: Bilateral intracranial ICA calcific atherosclerosis with severe greater than left paraclinoid ICA stenosis. Bilateral M1 MCAs are small but patent. No proximal M2 MCA occlusion. Small or absent right A1 ACA, likely congenital. Otherwise, patent ACAs. Posterior circulation: Small/non dominant left vertebral artery appears to largely terminate as PICA. Mild atherosclerotic narrowing of the dominant right intradural vertebral artery. The basilar artery is small with superimposed moderate atherosclerotic narrowing along its midportion. Both superior cerebellar arteries are small with nonvisualization of the mid and distal left superior cerebellar artery, likely stenotic or occluded. Fetal type PCA on the right with severe right P2 PCA stenosis. Venous sinuses: As permitted by contrast timing, patent. Anatomic variants: As detailed above Review of the MIP images confirms the above findings IMPRESSION: CT head: 1. Known acute infarcts in the cerebellar vermis and left cerebellar hemisphere are not well characterized by CT. No acute hemorrhage or mass effect. 2. Redemonstrated moderate to advanced chronic microvascular ischemic disease and atrophy with disproportionate right hippocampal volume loss. CTA head: 1. Severe right greater than left paraclinoid ICA stenosis. 2. The superior cerebellar arteries are difficult to assess due to their small size with nonvisualization of the mid and distal left superior cerebellar artery, likely stenotic or occluded. 3. Small vertebrobasilar system with superimposed moderate narrowing of the mid basilar artery and severe right P2 PCA stenosis. CTA neck: 1.  Approximately 40% stenosis of the distal right common carotid artery. 2. Bilateral carotid bifurcation atherosclerosis without greater than 50% stenosis. Electronically Signed   By: Margaretha Sheffield M.D.   On: 11/06/2020 13:27   DG Chest 2 View  Result Date: 11/05/2020 CLINICAL DATA:  Weakness, fall EXAM: CHEST - 2 VIEW COMPARISON:  09/25/2017 FINDINGS: Lungs are clear.  No pleural effusion or pneumothorax. The heart is normal in size. Mild degenerative changes of the visualized thoracolumbar spine. IMPRESSION: Normal chest radiographs. Electronically Signed   By: Julian Hy M.D.   On: 11/05/2020 19:32   CT HEAD WO CONTRAST (5MM)  Result Date: 11/05/2020 CLINICAL DATA:  Mental status change. EXAM: CT HEAD WITHOUT CONTRAST TECHNIQUE: Contiguous axial images were obtained from the base of the skull through the vertex without intravenous contrast. COMPARISON:  None. FINDINGS: Brain: No acute intracranial hemorrhage. No focal mass lesion. No CT evidence of acute infarction. No midline shift or mass effect. No hydrocephalus. Basilar cisterns are patent. There are periventricular and subcortical white matter hypodensities. Generalized cortical atrophy. Vascular: No hyperdense vessel or unexpected calcification. Skull: Normal. Negative for fracture or focal lesion. Sinuses/Orbits:  Paranasal sinuses and mastoid air cells are clear. Orbits are clear. Other: None. IMPRESSION: 1. No acute intracranial findings. 2. Chronic atrophy and periventricular white matter microvascular disease. Electronically Signed   By: Suzy Bouchard M.D.   On: 11/05/2020 19:33   MR BRAIN WO CONTRAST  Result Date: 11/06/2020 CLINICAL DATA:  Increased lethargy, reported fall EXAM: MRI HEAD WITHOUT CONTRAST TECHNIQUE: Multiplanar, multiecho pulse sequences of the brain and surrounding structures were obtained without intravenous contrast. COMPARISON:  Noncontrast CT head obtained 1 day prior FINDINGS: Brain: There are small acute  to early subacute infarcts in the cerebellar vermis and left cerebellar hemisphere in the SCA distribution. There is no associated hemorrhage. There is no other acute infarct. There is no extra-axial fluid collection. There is moderate global parenchymal volume loss with disproportionate severe right hippocampal atrophy. There is moderate left hippocampal atrophy. There is commensurate ex vacuo dilatation of the ventricular system. There is extensive confluent FLAIR signal abnormality throughout the subcortical and periventricular white matter likely reflecting sequela of advanced chronic white matter microangiopathy. There is no mass lesion.  There is no midline shift. Vascular: Normal flow voids. Skull and upper cervical spine: Normal marrow signal. Sinuses/Orbits: The imaged paranasal sinuses are clear. Bilateral lens implants are in place. The globes and orbits are otherwise unremarkable. Other: None. IMPRESSION: 1. Small early subacute infarcts in the cerebellar vermis and left cerebellar hemisphere in the SCA distribution. 2. Global parenchymal volume loss with disproportionate severe right hippocampal atrophy. 3. Advanced chronic white matter microangiopathy. Electronically Signed   By: Valetta Mole M.D.   On: 11/06/2020 09:34   DG Abd 2 Views  Result Date: 10/30/2020 CLINICAL DATA:  Constipation. EXAM: ABDOMEN - 2 VIEW COMPARISON:  None. FINDINGS: No abnormal bowel dilatation is noted. Moderate amount of stool seen throughout the colon. There is no evidence of free air. Calcified uterine fibroid is noted in the pelvis. IMPRESSION: Moderate stool burden.  No abnormal bowel dilatation. Electronically Signed   By: Marijo Conception M.D.   On: 10/30/2020 15:01   EEG adult  Result Date: 11/08/2020 Lora Havens, MD     11/08/2020 11:26 AM Patient Name: Stacey Fuentes MRN: [id number] Epilepsy Attending: Lora Havens Referring Physician/Provider: Hetty Blend, NP Date: 11/08/2020 Duration:  21.49 mins Patient history: 85 y.o. 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 Per daughter patient has had recent falls, AMS, word finding difficulty, staring spell and increased lethargy. EEG to evaluate for seizure. Level of alertness: Awake AEDs during EEG study: None Technical aspects: This EEG study was done with scalp electrodes positioned according to the 10-20 International system of electrode placement. Electrical activity was acquired at a sampling rate of 500Hz  and reviewed with a high frequency filter of 70Hz  and a low frequency filter of 1Hz . EEG data were recorded continuously and digitally stored. Description: The posterior dominant rhythm consists of 7.5 Hz activity of moderate voltage (25-35 uV) seen predominantly in posterior head regions, symmetric and reactive to eye opening and eye closing. EEG showed continuous generalized 5 to 6 Hz theta as well as intermittent 2-3Hz  delta slowing. Hyperventilation and photic stimulation were not performed.   ABNORMALITY - Continuous slow, generalized IMPRESSION: This study is suggestive of mild to moderate diffuse encephalopathy, nonspecific etiology. No seizures or epileptiform discharges were seen throughout the recording. [place]   ECHOCARDIOGRAM  COMPLETE  Result Date: 11/07/2020    ECHOCARDIOGRAM REPORT   Patient Name:   Stacey Fuentes Date of Exam: 11/07/2020 Medical Rec #:  [id number]      Height:       62.0 in Accession #:    [id number]     Weight:       160.0 lb Date of Birth:  May 31, 1935      BSA:          1.739 m Patient Age:    33 years       BP:           147/85 mmHg Patient Gender: F              HR:           95 bpm. Exam Location:  Inpatient Procedure: 2D Echo, Cardiac Doppler and Color Doppler Indications:    Stroke I63.9  History:        Patient has prior history of Echocardiogram  examinations, most                 recent 06/17/2017.  Sonographer:    Merrie Roof RDCS Referring Phys: [id number] [place]  1. Left ventricular ejection fraction, by estimation, is 60 to 65%. The left ventricle has normal function. The left ventricle has no regional wall motion abnormalities. There is moderate left ventricular hypertrophy. Left ventricular diastolic parameters are consistent with Grade I diastolic dysfunction (impaired relaxation).  2. Right ventricular systolic function is normal. The right ventricular size is normal.  3. The mitral valve is abnormal. Trivial mitral valve regurgitation. No evidence of mitral stenosis.  4. The aortic valve is tricuspid. There is mild calcification of the aortic valve. Aortic valve regurgitation is not visualized. Mild aortic valve sclerosis is present, with no evidence of aortic valve stenosis.  5. The inferior vena cava is normal in size with greater than 50% respiratory variability, suggesting right atrial pressure of 3 mmHg. FINDINGS  Left Ventricle: Left ventricular ejection fraction, by estimation, is 60 to 65%. The left ventricle has normal function. The left ventricle has no regional wall motion abnormalities. The left ventricular internal cavity size was normal in size. There is  moderate left ventricular hypertrophy. Left ventricular diastolic parameters are consistent with Grade I diastolic dysfunction (impaired relaxation). Right Ventricle: The right ventricular size is normal. No increase in right ventricular wall thickness. Right ventricular systolic function is normal. Left Atrium: Left atrial size was normal in size. Right Atrium: Right atrial size was normal in size. Pericardium: There is no evidence of pericardial effusion. Mitral Valve: The mitral valve is abnormal. There is mild thickening of the mitral valve leaflet(s). There is mild calcification of the mitral valve leaflet(s). Mild mitral annular calcification. Trivial mitral  valve regurgitation. No evidence of mitral valve stenosis. Tricuspid Valve: The tricuspid valve is normal in structure. Tricuspid valve regurgitation is not demonstrated. No evidence of tricuspid stenosis. Aortic Valve: The aortic valve is tricuspid. There is mild calcification of the aortic valve. Aortic valve regurgitation is not visualized. Mild aortic valve sclerosis is present, with no evidence of aortic valve stenosis. Aortic valve mean gradient measures 5.0 mmHg. Aortic valve peak gradient measures 7.4 mmHg. Aortic valve area, by VTI measures 2.56 cm. Pulmonic Valve: The pulmonic valve was normal in structure. Pulmonic valve regurgitation is not visualized. No evidence of pulmonic stenosis. Aorta: The aortic root is normal in size and structure. Venous: The inferior vena cava is normal in size with  greater than 50% respiratory variability, suggesting right atrial pressure of 3 mmHg. IAS/Shunts: No atrial level shunt detected by color flow Doppler.  LEFT VENTRICLE PLAX 2D LVIDd:         3.50 cm  Diastology LVIDs:         2.30 cm  LV e' medial:    4.90 cm/s LV PW:         1.30 cm  LV E/e' medial:  15.1 LV IVS:        1.40 cm  LV e' lateral:   6.31 cm/s LVOT diam:     2.00 cm  LV E/e' lateral: 11.7 LV SV:         69 LV SV Index:   40 LVOT Area:     3.14 cm  RIGHT VENTRICLE RV Basal diam:  2.80 cm LEFT ATRIUM             Index       RIGHT ATRIUM           Index LA diam:        3.20 cm 1.84 cm/m  RA Area:     14.30 cm LA Vol (A2C):   62.1 ml 35.72 ml/m RA Volume:   34.20 ml  19.67 ml/m LA Vol (A4C):   45.7 ml 26.28 ml/m LA Biplane Vol: 56.0 ml 32.21 ml/m  AORTIC VALVE AV Area (Vmax):    2.66 cm AV Area (Vmean):   2.44 cm AV Area (VTI):     2.56 cm AV Vmax:           136.00 cm/s AV Vmean:          102.000 cm/s AV VTI:            0.271 m AV Peak Grad:      7.4 mmHg AV Mean Grad:      5.0 mmHg LVOT Vmax:         115.00 cm/s LVOT Vmean:        79.300 cm/s LVOT VTI:          0.221 m LVOT/AV VTI ratio: 0.82   AORTA Ao Root diam: 3.50 cm Ao Asc diam:  3.40 cm MITRAL VALVE MV Area (PHT): 4.06 cm     SHUNTS MV Decel Time: 187 msec     Systemic VTI:  0.22 m MV E velocity: 74.10 cm/s   Systemic Diam: 2.00 cm MV A velocity: 114.00 cm/s MV E/A ratio:  0.65 Hughett Rouge MD Electronically signed by Olivos Rouge MD Signature Date/Time: 11/07/2020/9:45:48 AM    Final     Microbiology: Recent Results (from the past 240 hour(s))  Resp Panel by RT-PCR (Flu A&B, Covid) Nasopharyngeal Swab     Status: None   Collection Time: 11/05/20  8:08 PM   Specimen: Nasopharyngeal Swab; Nasopharyngeal(NP) swabs in vial transport medium  Result Value Ref Range Status   SARS Coronavirus 2 by RT PCR NEGATIVE NEGATIVE Final    Comment: (NOTE) SARS-CoV-2 target nucleic acids are NOT DETECTED.  The SARS-CoV-2 RNA is generally detectable in upper respiratory specimens during the acute phase of infection. The lowest concentration of SARS-CoV-2 viral copies this assay can detect is 138 copies/mL. A negative result does not preclude SARS-Cov-2 infection and should not be used as the sole basis for treatment or other patient management decisions. A negative result may occur with  improper specimen collection/handling, submission of specimen other than nasopharyngeal swab, presence of viral mutation(s) within the areas targeted by this assay,  and inadequate number of viral copies(<138 copies/mL). A negative result must be combined with clinical observations, patient history, and epidemiological information. The expected result is Negative.  Fact Sheet for Patients:  EntrepreneurPulse.com.au  Fact Sheet for Healthcare Providers:  IncredibleEmployment.be  This test is no t yet approved or cleared by the [place] FDA and  has been authorized for detection and/or diagnosis of SARS-CoV-2 by FDA under an Emergency Use Authorization (EUA). This EUA will remain  in effect (meaning this test can  be used) for the duration of the COVID-19 declaration under Section 564(b)(1) of the Act, 21 U.S.C.section 360bbb-3(b)(1), unless the authorization is terminated  or revoked sooner.       Influenza A by PCR NEGATIVE NEGATIVE Final   Influenza B by PCR NEGATIVE NEGATIVE Final    Comment: (NOTE) The Xpert Xpress SARS-CoV-2/FLU/RSV plus assay is intended as an aid in the diagnosis of influenza from Nasopharyngeal swab specimens and should not be used as a sole basis for treatment. Nasal washings and aspirates are unacceptable for Xpert Xpress SARS-CoV-2/FLU/RSV testing.  Fact Sheet for Patients: EntrepreneurPulse.com.au  Fact Sheet for Healthcare Providers: IncredibleEmployment.be  This test is not yet approved or cleared by the [place] FDA and has been authorized for detection and/or diagnosis of SARS-CoV-2 by FDA under an Emergency Use Authorization (EUA). This EUA will remain in effect (meaning this test can be used) for the duration of the COVID-19 declaration under Section 564(b)(1) of the Act, 21 U.S.C. section 360bbb-3(b)(1), unless the authorization is terminated or revoked.  Performed at [facility], [place] [address], [place], [place] [address]   SARS CORONAVIRUS 2 (TAT 6-24 HRS) Nasopharyngeal Nasopharyngeal Swab     Status: None   Collection Time: 11/09/20  5:28 PM   Specimen: Nasopharyngeal Swab  Result Value Ref Range Status   SARS Coronavirus 2 NEGATIVE NEGATIVE Final    Comment: (NOTE) SARS-CoV-2 target nucleic acids are NOT DETECTED.  The SARS-CoV-2 RNA is generally detectable in upper and lower respiratory specimens during the acute phase of infection. Negative results do not preclude SARS-CoV-2 infection, do not rule out co-infections with other pathogens, and should not be used as the sole basis for treatment or other patient management decisions. Negative results must be combined with clinical  observations, patient history, and epidemiological information. The expected result is Negative.  Fact Sheet for Patients: SugarRoll.be  Fact Sheet for Healthcare Providers: https://www.woods-mathews.com/  This test is not yet approved or cleared by the [place] FDA and  has been authorized for detection and/or diagnosis of SARS-CoV-2 by FDA under an Emergency Use Authorization (EUA). This EUA will remain  in effect (meaning this test can be used) for the duration of the COVID-19 declaration under Se ction 564(b)(1) of the Act, 21 U.S.C. section 360bbb-3(b)(1), unless the authorization is terminated or revoked sooner.  Performed at [facility], [place] [address] [place], [place] [address]      Labs: Basic Metabolic Panel: Recent Labs  Lab 11/06/20 1340 11/07/20 0745 11/08/20 0352 11/09/20 0323 11/11/20 0251  NA  --  140 138 138 140  K  --  3.5 3.5 3.8 3.8  CL  --  108 104 103 105  CO2  --  26 26 25 26   GLUCOSE  --  80 180* 140* 154*  BUN  --  13 14 16  24*  CREATININE 0.97 0.91 1.04* 1.08* 1.14*  CALCIUM  --  9.4 9.6 9.3 9.4   Liver Function Tests:  Recent Labs  Lab 11/07/20 0745 11/11/20 0251  AST 36 44*  ALT 16 30  ALKPHOS 48 47  BILITOT 0.8 0.7  PROT 6.1* 5.9*  ALBUMIN 3.4* 3.1*   No results for input(s): LIPASE, AMYLASE in the last 168 hours. No results for input(s): AMMONIA in the last 168 hours. CBC: Recent Labs  Lab 11/06/20 1340 11/07/20 0745 11/08/20 0352 11/09/20 0323 11/09/20 1315  WBC 9.1 8.0 8.1 8.1 7.6  NEUTROABS  --  5.2 5.1 4.8  --   HGB 11.6* 11.8* 12.1 12.6 12.2  HCT 36.9 36.9 37.8 37.8 38.2  MCV 99.7 97.4 95.7 94.0 97.4  PLT 148* 148* 158 71* 160   Cardiac Enzymes: No results for input(s): CKTOTAL, CKMB, CKMBINDEX, TROPONINI in the last 168 hours. BNP: BNP (last 3 results) No results for input(s): BNP in the last 8760 hours.  ProBNP (last 3 results) No results for input(s):  PROBNP in the last 8760 hours.  CBG: Recent Labs  Lab 11/12/20 0600 11/12/20 1152 11/12/20 1622 11/12/20 2058 11/13/20 0625  GLUCAP 181* 377* 117* 125* 138*       Signed:  [name] MD   [facility] 11/13/2020, 8:35 AM

## 2020-11-13 NOTE — TOC Transition Note (Signed)
Transition of Care [facility]) - CM/SW Discharge Note   Patient Details  Name: Stacey Fuentes MRN: [id number] Date of Birth: 05-10-1935  [facility] CM/SW Contact:  Geralynn Ochs, LCSW Phone Number: 11/13/2020, 2:10 PM   Clinical Narrative:   Nurse to call report to [phone], Room 114.    Final next level of care: Skilled Nursing Facility Barriers to Discharge: Barriers Resolved   Patient Goals and CMS Choice   CMS Medicare.gov Compare Post Acute Care list provided to:: Patient Represenative (must comment) Choice offered to / list presented to : Adult Children  Discharge Placement              Patient chooses bed at: [facility] Patient to be transferred to facility by: [place] Name of family member notified: [name], left a voicemail Patient and family notified of of transfer: 11/13/20  Discharge Plan and Services In-house Referral: Clinical Social Work Discharge Planning Services: CM Consult Post Acute Care Choice: [place]                               Social Determinants of Health (SDOH) Interventions     Readmission Risk Interventions No flowsheet data found.

## 2020-11-13 NOTE — Progress Notes (Signed)
Hand off report given to nurse at receiving SNF, awaiting [facility] to transfer patient.

## 2020-11-14 DIAGNOSIS — Z1159 Encounter for screening for other viral diseases: Secondary | ICD-10-CM | POA: Diagnosis not present

## 2020-11-14 DIAGNOSIS — F03918 Unspecified dementia, unspecified severity, with other behavioral disturbance: Secondary | ICD-10-CM | POA: Diagnosis not present

## 2020-11-14 DIAGNOSIS — G459 Transient cerebral ischemic attack, unspecified: Secondary | ICD-10-CM | POA: Diagnosis not present

## 2020-11-14 DIAGNOSIS — E1110 Type 2 diabetes mellitus with ketoacidosis without coma: Secondary | ICD-10-CM | POA: Diagnosis not present

## 2020-11-14 DIAGNOSIS — N182 Chronic kidney disease, stage 2 (mild): Secondary | ICD-10-CM | POA: Diagnosis not present

## 2020-11-14 DIAGNOSIS — Z831 Family history of other infectious and parasitic diseases: Secondary | ICD-10-CM | POA: Diagnosis not present

## 2020-11-14 DIAGNOSIS — E1122 Type 2 diabetes mellitus with diabetic chronic kidney disease: Secondary | ICD-10-CM | POA: Diagnosis not present

## 2020-11-14 DIAGNOSIS — E870 Hyperosmolality and hypernatremia: Secondary | ICD-10-CM | POA: Diagnosis not present

## 2020-11-14 DIAGNOSIS — I69393 Ataxia following cerebral infarction: Secondary | ICD-10-CM | POA: Diagnosis not present

## 2020-11-14 DIAGNOSIS — R401 Stupor: Secondary | ICD-10-CM | POA: Diagnosis not present

## 2020-11-14 DIAGNOSIS — E860 Dehydration: Secondary | ICD-10-CM | POA: Diagnosis not present

## 2020-11-14 DIAGNOSIS — Z79899 Other long term (current) drug therapy: Secondary | ICD-10-CM | POA: Diagnosis not present

## 2020-11-14 DIAGNOSIS — R770 Abnormality of albumin: Secondary | ICD-10-CM | POA: Diagnosis not present

## 2020-11-14 DIAGNOSIS — I69820 Aphasia following other cerebrovascular disease: Secondary | ICD-10-CM | POA: Diagnosis not present

## 2020-11-14 DIAGNOSIS — K219 Gastro-esophageal reflux disease without esophagitis: Secondary | ICD-10-CM | POA: Diagnosis not present

## 2020-11-14 DIAGNOSIS — I499 Cardiac arrhythmia, unspecified: Secondary | ICD-10-CM | POA: Diagnosis not present

## 2020-11-14 DIAGNOSIS — Z23 Encounter for immunization: Secondary | ICD-10-CM | POA: Diagnosis not present

## 2020-11-14 DIAGNOSIS — M6281 Muscle weakness (generalized): Secondary | ICD-10-CM | POA: Diagnosis not present

## 2020-11-14 DIAGNOSIS — E785 Hyperlipidemia, unspecified: Secondary | ICD-10-CM | POA: Diagnosis not present

## 2020-11-14 DIAGNOSIS — I951 Orthostatic hypotension: Secondary | ICD-10-CM | POA: Diagnosis not present

## 2020-11-14 DIAGNOSIS — N19 Unspecified kidney failure: Secondary | ICD-10-CM | POA: Diagnosis not present

## 2020-11-14 DIAGNOSIS — I129 Hypertensive chronic kidney disease with stage 1 through stage 4 chronic kidney disease, or unspecified chronic kidney disease: Secondary | ICD-10-CM | POA: Diagnosis not present

## 2020-11-14 DIAGNOSIS — E876 Hypokalemia: Secondary | ICD-10-CM | POA: Diagnosis not present

## 2020-11-14 DIAGNOSIS — R4182 Altered mental status, unspecified: Secondary | ICD-10-CM | POA: Diagnosis not present

## 2020-11-14 DIAGNOSIS — Z7401 Bed confinement status: Secondary | ICD-10-CM | POA: Diagnosis not present

## 2020-11-14 DIAGNOSIS — R5383 Other fatigue: Secondary | ICD-10-CM | POA: Diagnosis not present

## 2020-11-14 DIAGNOSIS — R739 Hyperglycemia, unspecified: Secondary | ICD-10-CM | POA: Diagnosis not present

## 2020-11-14 DIAGNOSIS — R296 Repeated falls: Secondary | ICD-10-CM | POA: Diagnosis not present

## 2020-11-14 DIAGNOSIS — F0390 Unspecified dementia without behavioral disturbance: Secondary | ICD-10-CM | POA: Diagnosis present

## 2020-11-14 DIAGNOSIS — J9811 Atelectasis: Secondary | ICD-10-CM | POA: Diagnosis not present

## 2020-11-14 DIAGNOSIS — R404 Transient alteration of awareness: Secondary | ICD-10-CM | POA: Diagnosis not present

## 2020-11-14 DIAGNOSIS — Z8673 Personal history of transient ischemic attack (TIA), and cerebral infarction without residual deficits: Secondary | ICD-10-CM | POA: Diagnosis not present

## 2020-11-14 DIAGNOSIS — E1165 Type 2 diabetes mellitus with hyperglycemia: Secondary | ICD-10-CM | POA: Diagnosis not present

## 2020-11-14 DIAGNOSIS — E46 Unspecified protein-calorie malnutrition: Secondary | ICD-10-CM | POA: Diagnosis not present

## 2020-11-14 DIAGNOSIS — E1169 Type 2 diabetes mellitus with other specified complication: Secondary | ICD-10-CM | POA: Diagnosis not present

## 2020-11-14 DIAGNOSIS — G250 Essential tremor: Secondary | ICD-10-CM | POA: Diagnosis not present

## 2020-11-14 DIAGNOSIS — N179 Acute kidney failure, unspecified: Secondary | ICD-10-CM | POA: Diagnosis not present

## 2020-11-14 DIAGNOSIS — I639 Cerebral infarction, unspecified: Secondary | ICD-10-CM | POA: Diagnosis not present

## 2020-11-14 DIAGNOSIS — R000 Tachycardia, unspecified: Secondary | ICD-10-CM | POA: Diagnosis not present

## 2020-11-14 DIAGNOSIS — D649 Anemia, unspecified: Secondary | ICD-10-CM | POA: Diagnosis not present

## 2020-11-14 DIAGNOSIS — E1121 Type 2 diabetes mellitus with diabetic nephropathy: Secondary | ICD-10-CM | POA: Diagnosis not present

## 2020-11-14 DIAGNOSIS — Z743 Need for continuous supervision: Secondary | ICD-10-CM | POA: Diagnosis not present

## 2020-11-14 DIAGNOSIS — R0902 Hypoxemia: Secondary | ICD-10-CM | POA: Diagnosis not present

## 2020-11-14 DIAGNOSIS — G9341 Metabolic encephalopathy: Secondary | ICD-10-CM | POA: Diagnosis not present

## 2020-11-14 DIAGNOSIS — E119 Type 2 diabetes mellitus without complications: Secondary | ICD-10-CM | POA: Diagnosis not present

## 2020-11-14 DIAGNOSIS — M810 Age-related osteoporosis without current pathological fracture: Secondary | ICD-10-CM | POA: Diagnosis not present

## 2020-11-14 DIAGNOSIS — Z20822 Contact with and (suspected) exposure to covid-19: Secondary | ICD-10-CM | POA: Diagnosis not present

## 2020-11-14 DIAGNOSIS — Z794 Long term (current) use of insulin: Secondary | ICD-10-CM | POA: Diagnosis not present

## 2020-11-14 DIAGNOSIS — R451 Restlessness and agitation: Secondary | ICD-10-CM | POA: Diagnosis not present

## 2020-11-14 DIAGNOSIS — E1140 Type 2 diabetes mellitus with diabetic neuropathy, unspecified: Secondary | ICD-10-CM | POA: Diagnosis not present

## 2020-11-14 DIAGNOSIS — N189 Chronic kidney disease, unspecified: Secondary | ICD-10-CM | POA: Diagnosis not present

## 2020-11-14 DIAGNOSIS — I10 Essential (primary) hypertension: Secondary | ICD-10-CM | POA: Diagnosis not present

## 2020-11-14 DIAGNOSIS — E559 Vitamin D deficiency, unspecified: Secondary | ICD-10-CM | POA: Diagnosis not present

## 2020-11-14 NOTE — Progress Notes (Signed)
Patient picked up by [facility] via stretcher and suitcase sent with transport at [phone].

## 2020-11-19 DIAGNOSIS — E1140 Type 2 diabetes mellitus with diabetic neuropathy, unspecified: Secondary | ICD-10-CM | POA: Diagnosis not present

## 2020-11-23 DIAGNOSIS — E1165 Type 2 diabetes mellitus with hyperglycemia: Secondary | ICD-10-CM | POA: Diagnosis not present

## 2020-11-26 ENCOUNTER — Emergency Department (HOSPITAL_COMMUNITY): Payer: Medicare Other

## 2020-11-26 ENCOUNTER — Encounter (HOSPITAL_COMMUNITY): Payer: Self-pay | Admitting: Emergency Medicine

## 2020-11-26 ENCOUNTER — Inpatient Hospital Stay (HOSPITAL_COMMUNITY)
Admission: EM | Admit: 2020-11-26 | Discharge: 2020-12-01 | DRG: 637 | Disposition: A | Discharge status: Home Health | Payer: Medicare Other | Source: Skilled Nursing Facility | Attending: Internal Medicine | Admitting: Internal Medicine

## 2020-11-26 DIAGNOSIS — R401 Stupor: Secondary | ICD-10-CM | POA: Diagnosis not present

## 2020-11-26 DIAGNOSIS — E1122 Type 2 diabetes mellitus with diabetic chronic kidney disease: Secondary | ICD-10-CM | POA: Diagnosis not present

## 2020-11-26 DIAGNOSIS — R739 Hyperglycemia, unspecified: Secondary | ICD-10-CM | POA: Diagnosis not present

## 2020-11-26 DIAGNOSIS — M810 Age-related osteoporosis without current pathological fracture: Secondary | ICD-10-CM | POA: Diagnosis present

## 2020-11-26 DIAGNOSIS — E860 Dehydration: Secondary | ICD-10-CM | POA: Diagnosis not present

## 2020-11-26 DIAGNOSIS — R296 Repeated falls: Secondary | ICD-10-CM | POA: Diagnosis present

## 2020-11-26 DIAGNOSIS — F0390 Unspecified dementia without behavioral disturbance: Secondary | ICD-10-CM | POA: Diagnosis present

## 2020-11-26 DIAGNOSIS — E876 Hypokalemia: Secondary | ICD-10-CM | POA: Diagnosis present

## 2020-11-26 DIAGNOSIS — G9341 Metabolic encephalopathy: Secondary | ICD-10-CM | POA: Diagnosis present

## 2020-11-26 DIAGNOSIS — E870 Hyperosmolality and hypernatremia: Secondary | ICD-10-CM | POA: Diagnosis not present

## 2020-11-26 DIAGNOSIS — I10 Essential (primary) hypertension: Secondary | ICD-10-CM | POA: Diagnosis not present

## 2020-11-26 DIAGNOSIS — Z7401 Bed confinement status: Secondary | ICD-10-CM | POA: Diagnosis not present

## 2020-11-26 DIAGNOSIS — Z794 Long term (current) use of insulin: Secondary | ICD-10-CM

## 2020-11-26 DIAGNOSIS — E1110 Type 2 diabetes mellitus with ketoacidosis without coma: Principal | ICD-10-CM | POA: Diagnosis present

## 2020-11-26 DIAGNOSIS — N189 Chronic kidney disease, unspecified: Secondary | ICD-10-CM | POA: Diagnosis not present

## 2020-11-26 DIAGNOSIS — N182 Chronic kidney disease, stage 2 (mild): Secondary | ICD-10-CM | POA: Diagnosis not present

## 2020-11-26 DIAGNOSIS — Z20822 Contact with and (suspected) exposure to covid-19: Secondary | ICD-10-CM | POA: Diagnosis present

## 2020-11-26 DIAGNOSIS — E785 Hyperlipidemia, unspecified: Secondary | ICD-10-CM | POA: Diagnosis not present

## 2020-11-26 DIAGNOSIS — G319 Degenerative disease of nervous system, unspecified: Secondary | ICD-10-CM | POA: Diagnosis not present

## 2020-11-26 DIAGNOSIS — R404 Transient alteration of awareness: Secondary | ICD-10-CM | POA: Diagnosis not present

## 2020-11-26 DIAGNOSIS — E559 Vitamin D deficiency, unspecified: Secondary | ICD-10-CM | POA: Diagnosis not present

## 2020-11-26 DIAGNOSIS — I499 Cardiac arrhythmia, unspecified: Secondary | ICD-10-CM | POA: Diagnosis not present

## 2020-11-26 DIAGNOSIS — Z79899 Other long term (current) drug therapy: Secondary | ICD-10-CM

## 2020-11-26 DIAGNOSIS — R4182 Altered mental status, unspecified: Secondary | ICD-10-CM | POA: Diagnosis not present

## 2020-11-26 DIAGNOSIS — R0902 Hypoxemia: Secondary | ICD-10-CM | POA: Diagnosis not present

## 2020-11-26 DIAGNOSIS — E1169 Type 2 diabetes mellitus with other specified complication: Secondary | ICD-10-CM | POA: Diagnosis not present

## 2020-11-26 DIAGNOSIS — E119 Type 2 diabetes mellitus without complications: Secondary | ICD-10-CM | POA: Diagnosis not present

## 2020-11-26 DIAGNOSIS — Z8673 Personal history of transient ischemic attack (TIA), and cerebral infarction without residual deficits: Secondary | ICD-10-CM

## 2020-11-26 DIAGNOSIS — E1165 Type 2 diabetes mellitus with hyperglycemia: Secondary | ICD-10-CM | POA: Diagnosis not present

## 2020-11-26 DIAGNOSIS — F03918 Unspecified dementia, unspecified severity, with other behavioral disturbance: Secondary | ICD-10-CM | POA: Diagnosis not present

## 2020-11-26 DIAGNOSIS — Z831 Family history of other infectious and parasitic diseases: Secondary | ICD-10-CM | POA: Diagnosis not present

## 2020-11-26 DIAGNOSIS — D649 Anemia, unspecified: Secondary | ICD-10-CM | POA: Diagnosis not present

## 2020-11-26 DIAGNOSIS — I639 Cerebral infarction, unspecified: Secondary | ICD-10-CM | POA: Diagnosis not present

## 2020-11-26 DIAGNOSIS — I129 Hypertensive chronic kidney disease with stage 1 through stage 4 chronic kidney disease, or unspecified chronic kidney disease: Secondary | ICD-10-CM | POA: Diagnosis not present

## 2020-11-26 DIAGNOSIS — N183 Chronic kidney disease, stage 3 unspecified: Secondary | ICD-10-CM | POA: Diagnosis not present

## 2020-11-26 DIAGNOSIS — I69820 Aphasia following other cerebrovascular disease: Secondary | ICD-10-CM | POA: Diagnosis not present

## 2020-11-26 DIAGNOSIS — N19 Unspecified kidney failure: Secondary | ICD-10-CM | POA: Diagnosis not present

## 2020-11-26 DIAGNOSIS — R000 Tachycardia, unspecified: Secondary | ICD-10-CM | POA: Diagnosis not present

## 2020-11-26 DIAGNOSIS — R5383 Other fatigue: Secondary | ICD-10-CM | POA: Diagnosis not present

## 2020-11-26 DIAGNOSIS — J9811 Atelectasis: Secondary | ICD-10-CM | POA: Diagnosis not present

## 2020-11-26 DIAGNOSIS — Z743 Need for continuous supervision: Secondary | ICD-10-CM | POA: Diagnosis not present

## 2020-11-26 DIAGNOSIS — N179 Acute kidney failure, unspecified: Secondary | ICD-10-CM | POA: Diagnosis not present

## 2020-11-26 LAB — URINALYSIS, ROUTINE W REFLEX MICROSCOPIC
Bacteria, UA: NONE SEEN
Bilirubin Urine: NEGATIVE
Glucose, UA: >=500 mg/dL — AB
Hgb urine dipstick: NEGATIVE
Ketones, ur: 80 mg/dL — AB
Leukocytes,Ua: NEGATIVE
Nitrite: NEGATIVE
Protein, ur: NEGATIVE mg/dL
Specific Gravity, Urine: 1.026 (ref 1.005–1.030)
pH: 5 (ref 5.0–8.0)

## 2020-11-26 LAB — COMPREHENSIVE METABOLIC PANEL
ALT: 20 U/L (ref 0–44)
AST: 16 U/L (ref 15–41)
Albumin: 3.7 g/dL (ref 3.5–5.0)
Alkaline Phosphatase: 65 U/L (ref 38–126)
Anion gap: 20 — ABNORMAL HIGH (ref 5–15)
BUN: 55 mg/dL — ABNORMAL HIGH (ref 8–23)
CO2: 23 mmol/L (ref 22–32)
Calcium: 10.2 mg/dL (ref 8.9–10.3)
Chloride: 105 mmol/L (ref 98–111)
Creatinine, Ser: 1.75 mg/dL — ABNORMAL HIGH (ref 0.44–1.00)
GFR, Estimated: 29 mL/min — ABNORMAL LOW (ref >60)
Glucose, Bld: 602 mg/dL (ref 70–99)
Potassium: 4.6 mmol/L (ref 3.5–5.1)
Sodium: 148 mmol/L — ABNORMAL HIGH (ref 135–145)
Total Bilirubin: 1 mg/dL (ref 0.3–1.2)
Total Protein: 7.2 g/dL (ref 6.5–8.1)

## 2020-11-26 LAB — LACTIC ACID, PLASMA
Lactic Acid, Venous: 2.4 mmol/L (ref 0.5–1.9)
Lactic Acid, Venous: 2.2 mmol/L (ref 0.5–1.9)

## 2020-11-26 LAB — I-STAT VENOUS BLOOD GAS, ED
Acid-base deficit: 3 mmol/L — ABNORMAL HIGH (ref 0.0–2.0)
Acid-base deficit: 3 mmol/L — ABNORMAL HIGH (ref 0.0–2.0)
Bicarbonate: 21.3 mmol/L (ref 20.0–28.0)
Bicarbonate: 22.1 mmol/L (ref 20.0–28.0)
Calcium, Ion: 1.08 mmol/L — ABNORMAL LOW (ref 1.15–1.40)
Calcium, Ion: 1.09 mmol/L — ABNORMAL LOW (ref 1.15–1.40)
HCT: 35 % — ABNORMAL LOW (ref 36.0–46.0)
HCT: 35 % — ABNORMAL LOW (ref 36.0–46.0)
Hemoglobin: 11.9 g/dL — ABNORMAL LOW (ref 12.0–15.0)
Hemoglobin: 11.9 g/dL — ABNORMAL LOW (ref 12.0–15.0)
O2 Saturation: 69 %
O2 Saturation: 84 %
Potassium: 3.6 mmol/L (ref 3.5–5.1)
Potassium: 3.6 mmol/L (ref 3.5–5.1)
Sodium: 151 mmol/L — ABNORMAL HIGH (ref 135–145)
Sodium: 151 mmol/L — ABNORMAL HIGH (ref 135–145)
TCO2: 22 mmol/L (ref 22–32)
TCO2: 23 mmol/L (ref 22–32)
pCO2, Ven: 36.4 mmHg — ABNORMAL LOW (ref 44.0–60.0)
pCO2, Ven: 37.5 mmHg — ABNORMAL LOW (ref 44.0–60.0)
pH, Ven: 7.376 (ref 7.250–7.430)
pH, Ven: 7.379 (ref 7.250–7.430)
pO2, Ven: 37 mmHg (ref 32.0–45.0)
pO2, Ven: 50 mmHg — ABNORMAL HIGH (ref 32.0–45.0)

## 2020-11-26 LAB — RESP PANEL BY RT-PCR (FLU A&B, COVID) ARPGX2
Influenza A by PCR: NEGATIVE
Influenza B by PCR: NEGATIVE
SARS Coronavirus 2 by RT PCR: NEGATIVE

## 2020-11-26 LAB — PROTIME-INR
INR: 1.1 (ref 0.8–1.2)
Prothrombin Time: 13.9 seconds (ref 11.4–15.2)

## 2020-11-26 LAB — BASIC METABOLIC PANEL
Anion gap: 9 (ref 5–15)
Anion gap: 10 (ref 5–15)
BUN: 53 mg/dL — ABNORMAL HIGH (ref 8–23)
BUN: 52 mg/dL — ABNORMAL HIGH (ref 8–23)
CO2: 23 mmol/L (ref 22–32)
CO2: 23 mmol/L (ref 22–32)
Calcium: 9.5 mg/dL (ref 8.9–10.3)
Calcium: 10 mg/dL (ref 8.9–10.3)
Chloride: 115 mmol/L — ABNORMAL HIGH (ref 98–111)
Chloride: 120 mmol/L — ABNORMAL HIGH (ref 98–111)
Creatinine, Ser: 1.41 mg/dL — ABNORMAL HIGH (ref 0.44–1.00)
Creatinine, Ser: 1.22 mg/dL — ABNORMAL HIGH (ref 0.44–1.00)
GFR, Estimated: 37 mL/min — ABNORMAL LOW (ref >60)
GFR, Estimated: 44 mL/min — ABNORMAL LOW (ref >60)
Glucose, Bld: 457 mg/dL — ABNORMAL HIGH (ref 70–99)
Glucose, Bld: 229 mg/dL — ABNORMAL HIGH (ref 70–99)
Potassium: 4.2 mmol/L (ref 3.5–5.1)
Potassium: 4.6 mmol/L (ref 3.5–5.1)
Sodium: 147 mmol/L — ABNORMAL HIGH (ref 135–145)
Sodium: 153 mmol/L — ABNORMAL HIGH (ref 135–145)

## 2020-11-26 LAB — CBC WITH DIFFERENTIAL/PLATELET
Abs Immature Granulocytes: 0.06 10*3/uL (ref 0.00–0.07)
Basophils Absolute: 0 10*3/uL (ref 0.0–0.1)
Basophils Relative: 0 %
Eosinophils Absolute: 0 10*3/uL (ref 0.0–0.5)
Eosinophils Relative: 0 %
HCT: 45.8 % (ref 36.0–46.0)
Hemoglobin: 14 g/dL (ref 12.0–15.0)
Immature Granulocytes: 0 %
Lymphocytes Relative: 7 %
Lymphs Abs: 0.9 10*3/uL (ref 0.7–4.0)
MCH: 30.6 pg (ref 26.0–34.0)
MCHC: 30.6 g/dL (ref 30.0–36.0)
MCV: 100.2 fL — ABNORMAL HIGH (ref 80.0–100.0)
Monocytes Absolute: 0.6 10*3/uL (ref 0.1–1.0)
Monocytes Relative: 4 %
Neutro Abs: 12.4 10*3/uL — ABNORMAL HIGH (ref 1.7–7.7)
Neutrophils Relative %: 89 %
Platelets: 134 10*3/uL — ABNORMAL LOW (ref 150–400)
RBC: 4.57 MIL/uL (ref 3.87–5.11)
RDW: 12.4 % (ref 11.5–15.5)
WBC: 14 10*3/uL — ABNORMAL HIGH (ref 4.0–10.5)
nRBC: 0 % (ref 0.0–0.2)

## 2020-11-26 LAB — CBG MONITORING, ED
Glucose-Capillary: 140 mg/dL — ABNORMAL HIGH (ref 70–99)
Glucose-Capillary: 161 mg/dL — ABNORMAL HIGH (ref 70–99)
Glucose-Capillary: 173 mg/dL — ABNORMAL HIGH (ref 70–99)
Glucose-Capillary: 202 mg/dL — ABNORMAL HIGH (ref 70–99)
Glucose-Capillary: 282 mg/dL — ABNORMAL HIGH (ref 70–99)
Glucose-Capillary: 363 mg/dL — ABNORMAL HIGH (ref 70–99)
Glucose-Capillary: 448 mg/dL — ABNORMAL HIGH (ref 70–99)
Glucose-Capillary: 452 mg/dL — ABNORMAL HIGH (ref 70–99)
Glucose-Capillary: 476 mg/dL — ABNORMAL HIGH (ref 70–99)
Glucose-Capillary: 486 mg/dL — ABNORMAL HIGH (ref 70–99)
Glucose-Capillary: 504 mg/dL (ref 70–99)
Glucose-Capillary: 591 mg/dL (ref 70–99)

## 2020-11-26 LAB — APTT: aPTT: 22 seconds — ABNORMAL LOW (ref 24–36)

## 2020-11-26 LAB — BETA-HYDROXYBUTYRIC ACID
Beta-Hydroxybutyric Acid: 4.07 mmol/L — ABNORMAL HIGH (ref 0.05–0.27)
Beta-Hydroxybutyric Acid: 1.59 mmol/L — ABNORMAL HIGH (ref 0.05–0.27)

## 2020-11-26 MED ORDER — DEXTROSE 50 % IV SOLN: 0.0000 mL | INTRAVENOUS | Status: DC | PRN

## 2020-11-26 MED ORDER — SODIUM CHLORIDE 0.9 % IV BOLUS
1000.0000 mL | Freq: Once | INTRAVENOUS | Status: AC
  Administered 2020-11-26: 1000 mL via INTRAVENOUS

## 2020-11-26 MED ORDER — INSULIN REGULAR(HUMAN) IN NACL 100-0.9 UT/100ML-% IV SOLN
INTRAVENOUS | Status: DC
  Administered 2020-11-26: 11 [IU]/h via INTRAVENOUS
  Administered 2020-11-27: 3.4 [IU]/h via INTRAVENOUS
  Administered 2020-11-27: 5.5 [IU]/h via INTRAVENOUS
  Filled 2020-11-26: qty 100

## 2020-11-26 MED ORDER — DEXTROSE IN LACTATED RINGERS 5 % IV SOLN: INTRAVENOUS | Status: DC

## 2020-11-26 MED ORDER — INSULIN REGULAR(HUMAN) IN NACL 100-0.9 UT/100ML-% IV SOLN
INTRAVENOUS | Status: DC
  Administered 2020-11-26: 9 [IU]/h via INTRAVENOUS
  Filled 2020-11-26: qty 100

## 2020-11-26 MED ORDER — LACTATED RINGERS IV SOLN: INTRAVENOUS | Status: DC

## 2020-11-26 MED ORDER — POTASSIUM CHLORIDE 10 MEQ/100ML IV SOLN
10.0000 meq | INTRAVENOUS | Status: AC
  Administered 2020-11-26 (×2): 10 meq via INTRAVENOUS
  Filled 2020-11-26 (×2): qty 100

## 2020-11-26 MED ORDER — ENOXAPARIN SODIUM 30 MG/0.3ML IJ SOSY
30.0000 mg | PREFILLED_SYRINGE | INTRAMUSCULAR | Status: DC
  Administered 2020-11-26: 30 mg via SUBCUTANEOUS
  Filled 2020-11-26: qty 0.3

## 2020-11-26 NOTE — Progress Notes (Signed)
Inpatient Diabetes Program Recommendations  AACE/ADA: New Consensus Statement on Inpatient Glycemic Control (2015)  Target Ranges:  Prepandial:   less than 140 mg/dL      Peak postprandial:   less than 180 mg/dL (1-2 hours)      Critically ill patients:  140 - 180 mg/dL   Lab Results  Component Value Date   GLUCAP 486 (H) 11/26/2020   HGBA1C 8.3 (H) 11/07/2020    Review of Glycemic Control Results for Stacey Fuentes, Stacey Fuentes (MRN [id number]) as of 11/26/2020 15:37  Ref. Range 11/26/2020 12:03 11/26/2020 14:20  Glucose-Capillary Latest Ref Range: 70 - 99 mg/dL 591 (HH) 486 (H)   Diabetes history: DM 2 Outpatient Diabetes medications:  Novolog 0-10 units tid with meals, Semglee 15 units q HS Current orders for Inpatient glycemic control:  IV insulin Inpatient Diabetes Program Recommendations:    Agree with orders.  Will follow.   Thanks,   Adah Perl, RN, BC-ADM Inpatient Diabetes Coordinator Pager [phone]  (8a-5p)

## 2020-11-26 NOTE — H&P (Signed)
TRH H&P    Patient Demographics:    Stacey Fuentes, is a 85 y.o. female  MRN: [id number]  DOB - [birth year]  Admit Date - 11/26/2020  Referring MD/NP/PA: Malachy Moan  Outpatient Primary MD for the patient is Harlan Stains, MD  Patient coming from: Skilled nursing facility  Chief complaint-unresponsiveness   HPI:    Stacey Fuentes  is a 85 y.o. female, with history of dementia, diabetes mellitus type 2, recent large SCA stroke, who was currently at skilled nursing facility for rehab.  Patient reported he did not wake up this morning, she was found to be altered and had elevated blood glucose reading at the skilled facility.  EMS was called and patient brought to hospital. In the ED, CBG was found to be above 600, patient largely unresponsive, also found to be in DKA, with beta hydroxybutyrate 4.07, anion gap of 20.  CT head showed no acute finding. No other history is obtainable at this time.    Review of systems:    Unable to obtain as patient is unresponsive    Past History of the following :    Past Medical History:  Diagnosis Date   Benign essential tremor    Cataract    CKD (chronic kidney disease), stage II    Diabetes mellitus ([place])    type2   Full dentures    Hyperlipidemia    Memory difficulty    Osteoporosis    Uncontrolled diabetes mellitus type 2 without complications 8/46/9629   Vitamin D deficiency    Wears glasses       Past Surgical History:  Procedure Laterality Date   APPENDECTOMY     childhood   CATARACT EXTRACTION, BILATERAL  2014   COLONOSCOPY     in [place] in [place] N/A 05/18/2013   Procedure: EXCISION OF SEBACEOUS CYST BACK;  Surgeon: Merrie Roof, MD;  Location: [place];  Service: General;  Laterality: N/A;   surgery to remove cyst on back  2010   x 2   TRANSTHORACIC ECHOCARDIOGRAM  06/17/2017    EF 60-65%.   Normal wall motion.  GR 1 DD.  No obvious valvular lesions.   TUBAL LIGATION        Social History:      Social History   Tobacco Use   Smoking status: Never   Smokeless tobacco: Never  Substance Use Topics   Alcohol use: No       Family History :     Family History  Problem Relation Age of Onset   Tuberculosis Mother    Other Father        homicide   Colon cancer Neg Hx    Esophageal cancer Neg Hx    Rectal cancer Neg Hx    Stomach cancer Neg Hx       Home Medications:   Prior to Admission medications   Medication Sig Start Date End Date Taking? Authorizing Provider  amLODipine (NORVASC) 5 MG tablet Take 5 mg by mouth  daily.   Yes [provider]  aspirin 81 MG tablet Take 1 tablet (81 mg total) by mouth daily. 11/13/20  Yes [name], MD  atorvastatin (LIPITOR) 40 MG tablet Take 1 tablet (40 mg total) by mouth daily. Patient taking differently: Take 40 mg by mouth at bedtime. 11/13/20  Yes [name], MD  calcium carbonate (OSCAL) 1500 (600 Ca) MG TABS tablet Take 600 mg of elemental calcium by mouth daily with breakfast.   Yes [provider]  Cholecalciferol (VITAMIN D3 PO) Take 1,000 mg by mouth daily.   Yes [provider]  clopidogrel (PLAVIX) 75 MG tablet Take 1 tablet (75 mg total) by mouth daily. 11/13/20  Yes [name], MD  esomeprazole (NEXIUM) 20 MG capsule Take 40 mg by mouth daily at 12 noon.   Yes [provider]  famotidine (PEPCID) 20 MG tablet Take 1 tablet (20 mg total) by mouth 2 (two) times daily as needed for heartburn or indigestion. Patient taking differently: Take 20 mg by mouth 2 (two) times daily. 09/25/17  Yes Drenda Freeze, MD  haloperidol (HALDOL) 0.5 MG tablet Take 1 tablet (0.5 mg total) by mouth every 8 (eight) hours as needed for agitation (agitation). 11/13/20  Yes [name], MD  insulin aspart (NOVOLOG) 100 UNIT/ML injection Inject 0-10 Units into the  skin as directed. Sliding Scale: 0-59 Notify MD, 60-150=0 units, 151-199=2 units, 200-249=4 units, 250-299=6 units, 300-349=8 units, 350-399=10 units, [phone] Notify MD   Yes [provider]  insulin glargine-yfgn (SEMGLEE) 100 UNIT/ML injection Inject 0.1 mLs (10 Units total) into the skin daily. Patient taking differently: Inject 15 Units into the skin at bedtime. 11/14/20  Yes [name], MD  latanoprost (XALATAN) 0.005 % ophthalmic solution Place 1 drop into both eyes every evening. 07/11/17  Yes [provider]  melatonin 5 MG TABS Take 10 mg by mouth at bedtime.   Yes [provider]  memantine (NAMENDA) 5 MG tablet Take 1 tablet (5 mg total) by mouth daily. 11/13/20  Yes [name], MD  metoprolol succinate (TOPROL-XL) 25 MG 24 hr tablet Take 0.5 tablets (12.5 mg total) by mouth daily. 11/13/20  Yes [name], MD  Multiple Vitamin (MULTIVITAMIN WITH MINERALS) TABS tablet Take 1 tablet by mouth daily.   Yes [provider]  OLANZapine zydis (ZYPREXA) 5 MG disintegrating tablet Take 1 tablet (5 mg total) by mouth daily at 4 PM. 11/13/20  Yes [name], MD  Omega-3 Fatty Acids (FISH OIL) 1000 MG CAPS Take 1 capsule by mouth daily.   Yes [provider]  vitamin B-12 (CYANOCOBALAMIN) 1000 MCG tablet Take 1,000 mcg by mouth daily.   Yes [provider]  ACCU-CHEK SOFTCLIX LANCETS lancets Use to check blood sugars twice daily 04/28/17   Philemon Kingdom, MD  amLODipine (NORVASC) 5 MG tablet Take 1 tablet (5 mg total) by mouth daily. 05/18/19 09/06/19  Wyvonnia Dusky, MD  esomeprazole (NEXIUM) 40 MG capsule Take 1 capsule (40 mg total) by mouth daily. Patient not taking: Reported on 11/26/2020 09/25/17   Drenda Freeze, MD  glucose blood (ACCU-CHEK AVIVA) test strip Use to check blood sugars twice daily 04/28/17   Philemon Kingdom, MD  Insulin Pen Needle (CAREFINE PEN NEEDLES) 32G X 4 MM MISC Use 4x a day  03/24/14   Philemon Kingdom, MD     Allergies:     Allergies  Allergen Reactions   Metformin And Related Nausea Only   Saxagliptin Nausea Only  Physical Exam:   Vitals  Blood pressure (!) 152/72, pulse (!) 119, temperature (!) 97.3 F (36.3 C), resp. rate (!) 26, height 5\' 2"  (1.575 m), weight 72.6 kg, SpO2 94 %.  1.  General: Unresponsive  2. Psychiatric: Unresponsive, barely opens eyes to painful stimuli  3. Neurologic: Moving upper extremities, withdraws lower extremities to pain  4. HEENMT:  Atraumatic normocephalic  5. Respiratory : Clear to auscultation bilaterally  6. Cardiovascular : S1-S2, regular, no murmur auscultated  7. Gastrointestinal:  Abdominal soft, nontender, no organomegaly     Data Review:    CBC Recent Labs  Lab 11/26/20 1214 11/26/20 1441 11/26/20 1451  WBC 14.0*  --   --   HGB 14.0 11.9* 11.9*  HCT 45.8 35.0* 35.0*  PLT 134*  --   --   MCV 100.2*  --   --   MCH 30.6  --   --   MCHC 30.6  --   --   RDW 12.4  --   --   LYMPHSABS 0.9  --   --   MONOABS 0.6  --   --   EOSABS 0.0  --   --   BASOSABS 0.0  --   --    ------------------------------------------------------------------------------------------------------------------  Results for orders placed or performed during the hospital encounter of 11/26/20 (from the past 48 hour(s))  CBG monitoring, ED     Status: Abnormal   Collection Time: 11/26/20 12:03 PM  Result Value Ref Range   Glucose-Capillary 591 (HH) 70 - 99 mg/dL    Comment: Glucose reference range applies only to samples taken after fasting for at least 8 hours.  Resp Panel by RT-PCR (Flu A&B, Covid) Nasopharyngeal Swab     Status: None   Collection Time: 11/26/20 12:14 PM   Specimen: Nasopharyngeal Swab; Nasopharyngeal(NP) swabs in vial transport medium  Result Value Ref Range   SARS Coronavirus 2 by RT PCR NEGATIVE NEGATIVE    Comment: (NOTE) SARS-CoV-2 target nucleic acids are NOT DETECTED.  The  SARS-CoV-2 RNA is generally detectable in upper respiratory specimens during the acute phase of infection. The lowest concentration of SARS-CoV-2 viral copies this assay can detect is 138 copies/mL. A negative result does not preclude SARS-Cov-2 infection and should not be used as the sole basis for treatment or other patient management decisions. A negative result may occur with  improper specimen collection/handling, submission of specimen other than nasopharyngeal swab, presence of viral mutation(s) within the areas targeted by this assay, and inadequate number of viral copies(<138 copies/mL). A negative result must be combined with clinical observations, patient history, and epidemiological information. The expected result is Negative.  Fact Sheet for Patients:  EntrepreneurPulse.com.au  Fact Sheet for Healthcare Providers:  IncredibleEmployment.be  This test is no t yet approved or cleared by the [place] FDA and  has been authorized for detection and/or diagnosis of SARS-CoV-2 by FDA under an Emergency Use Authorization (EUA). This EUA will remain  in effect (meaning this test can be used) for the duration of the COVID-19 declaration under Section 564(b)(1) of the Act, 21 U.S.C.section 360bbb-3(b)(1), unless the authorization is terminated  or revoked sooner.       Influenza A by PCR NEGATIVE NEGATIVE   Influenza B by PCR NEGATIVE NEGATIVE    Comment: (NOTE) The Xpert Xpress SARS-CoV-2/FLU/RSV plus assay is intended as an aid in the diagnosis of influenza from Nasopharyngeal swab specimens and should not be used as a sole basis for treatment. Nasal washings and  aspirates are unacceptable for Xpert Xpress SARS-CoV-2/FLU/RSV testing.  Fact Sheet for Patients: EntrepreneurPulse.com.au  Fact Sheet for Healthcare Providers: IncredibleEmployment.be  This test is not yet approved or cleared by the  [place] FDA and has been authorized for detection and/or diagnosis of SARS-CoV-2 by FDA under an Emergency Use Authorization (EUA). This EUA will remain in effect (meaning this test can be used) for the duration of the COVID-19 declaration under Section 564(b)(1) of the Act, 21 U.S.C. section 360bbb-3(b)(1), unless the authorization is terminated or revoked.  Performed at [facility], [place] [address] [place], [place] [address]   Comprehensive metabolic panel     Status: Abnormal   Collection Time: 11/26/20 12:14 PM  Result Value Ref Range   Sodium 148 (H) 135 - 145 mmol/L   Potassium 4.6 3.5 - 5.1 mmol/L   Chloride 105 98 - 111 mmol/L   CO2 23 22 - 32 mmol/L   Glucose, Bld 602 (HH) 70 - 99 mg/dL    Comment: Glucose reference range applies only to samples taken after fasting for at least 8 hours. CRITICAL RESULT CALLED TO, READ BACK BY AND VERIFIED WITH: M BARBER RN BY SSTEPHENS [phone]    BUN 55 (H) 8 - 23 mg/dL   Creatinine, Ser 1.75 (H) 0.44 - 1.00 mg/dL   Calcium 10.2 8.9 - 10.3 mg/dL   Total Protein 7.2 6.5 - 8.1 g/dL   Albumin 3.7 3.5 - 5.0 g/dL   AST 16 15 - 41 U/L   ALT 20 0 - 44 U/L   Alkaline Phosphatase 65 38 - 126 U/L   Total Bilirubin 1.0 0.3 - 1.2 mg/dL   GFR, Estimated 29 (L) >60 mL/min    Comment: (NOTE) Calculated using the CKD-EPI Creatinine Equation (2021)    Anion gap 20 (H) 5 - 15    Comment: Performed at [facility], [place] [address] [place], [place] [address]  CBC WITH DIFFERENTIAL     Status: Abnormal   Collection Time: 11/26/20 12:14 PM  Result Value Ref Range   WBC 14.0 (H) 4.0 - 10.5 K/uL   RBC 4.57 3.87 - 5.11 MIL/uL   Hemoglobin 14.0 12.0 - 15.0 g/dL   HCT 45.8 36.0 - 46.0 %   MCV 100.2 (H) 80.0 - 100.0 fL   MCH 30.6 26.0 - 34.0 pg   MCHC 30.6 30.0 - 36.0 g/dL   RDW 12.4 11.5 - 15.5 %   Platelets 134 (L) 150 - 400 K/uL   nRBC 0.0 0.0 - 0.2 %   Neutrophils Relative % 89 %   Neutro Abs 12.4 (H) 1.7 - 7.7 K/uL    Lymphocytes Relative 7 %   Lymphs Abs 0.9 0.7 - 4.0 K/uL   Monocytes Relative 4 %   Monocytes Absolute 0.6 0.1 - 1.0 K/uL   Eosinophils Relative 0 %   Eosinophils Absolute 0.0 0.0 - 0.5 K/uL   Basophils Relative 0 %   Basophils Absolute 0.0 0.0 - 0.1 K/uL   Immature Granulocytes 0 %   Abs Immature Granulocytes 0.06 0.00 - 0.07 K/uL    Comment: Performed at [facility] [address], [place], [place] [address]  Lactic acid, plasma     Status: Abnormal   Collection Time: 11/26/20 12:29 PM  Result Value Ref Range   Lactic Acid, Venous 2.4 (HH) 0.5 - 1.9 mmol/L    Comment: CRITICAL RESULT CALLED TO, READ BACK BY AND VERIFIED WITH: M BARBER RN BY SSTEPHENS  [phone] [id number] Performed at [facility], [place] [address], [place], [place] [address]   Beta-hydroxybutyric acid     Status: Abnormal   Collection Time: 11/26/20 12:29 PM  Result Value Ref Range   Beta-Hydroxybutyric Acid 4.07 (H) 0.05 - 0.27 mmol/L    Comment: Performed at [facility] [address] [place], [place] [address]  Protime-INR     Status: None   Collection Time: 11/26/20 12:29 PM  Result Value Ref Range   Prothrombin Time 13.9 11.4 - 15.2 seconds   INR 1.1 0.8 - 1.2    Comment: (NOTE) INR goal varies based on device and disease states. Performed at [facility], [place] [address] [place], [place] [address]   APTT     Status: Abnormal   Collection Time: 11/26/20 12:29 PM  Result Value Ref Range   aPTT 22 (L) 24 - 36 seconds    Comment: Performed at [facility] [address], [place], [place] [address]  Urinalysis, Routine w reflex microscopic Urine, In & Out Cath     Status: Abnormal   Collection Time: 11/26/20  1:49 PM  Result Value Ref Range   Color, Urine YELLOW YELLOW   APPearance CLEAR CLEAR   Specific Gravity, Urine 1.026 1.005 - 1.030   pH 5.0 5.0 - 8.0   Glucose, UA >=500 (A) NEGATIVE mg/dL   Hgb urine dipstick NEGATIVE NEGATIVE   Bilirubin Urine NEGATIVE NEGATIVE    Ketones, ur 80 (A) NEGATIVE mg/dL   Protein, ur NEGATIVE NEGATIVE mg/dL   Nitrite NEGATIVE NEGATIVE   Leukocytes,Ua NEGATIVE NEGATIVE   RBC / HPF 0-5 0 - 5 RBC/hpf   WBC, UA 0-5 0 - 5 WBC/hpf   Bacteria, UA NONE SEEN NONE SEEN   Squamous Epithelial / LPF 6-10 0 - 5   Mucus PRESENT    Hyaline Casts, UA PRESENT     Comment: Performed at [facility], [place] [address], [place], [place] [address]  Lactic acid, plasma     Status: Abnormal   Collection Time: 11/26/20  2:20 PM  Result Value Ref Range   Lactic Acid, Venous 2.2 (HH) 0.5 - 1.9 mmol/L    Comment: CRITICAL VALUE NOTED.  VALUE IS CONSISTENT WITH PREVIOUSLY REPORTED AND CALLED VALUE. Performed at [facility], [place] [address], [place], [place] [address]   CBG monitoring, ED     Status: Abnormal   Collection Time: 11/26/20  2:20 PM  Result Value Ref Range   Glucose-Capillary 486 (H) 70 - 99 mg/dL    Comment: Glucose reference range applies only to samples taken after fasting for at least 8 hours.  I-Stat venous blood gas, [facility] ED)     Status: Abnormal   Collection Time: 11/26/20  2:41 PM  Result Value Ref Range   pH, Ven 7.379 7.250 - 7.430   pCO2, Ven 37.5 (L) 44.0 - 60.0 mmHg   pO2, Ven 37.0 32.0 - 45.0 mmHg   Bicarbonate 22.1 20.0 - 28.0 mmol/L   TCO2 23 22 - 32 mmol/L   O2 Saturation 69.0 %   Acid-base deficit 3.0 (H) 0.0 - 2.0 mmol/L   Sodium 151 (H) 135 - 145 mmol/L   Potassium 3.6 3.5 - 5.1 mmol/L   Calcium, Ion 1.09 (L) 1.15 - 1.40 mmol/L   HCT 35.0 (L) 36.0 - 46.0 %   Hemoglobin 11.9 (L) 12.0 - 15.0 g/dL   Sample type VENOUS    Comment NOTIFIED  PHYSICIAN   I-Stat venous blood gas, ED     Status: Abnormal   Collection Time: 11/26/20  2:51 PM  Result Value Ref Range   pH, Ven 7.376 7.250 - 7.430   pCO2, Ven 36.4 (L) 44.0 - 60.0 mmHg   pO2, Ven 50.0 (H) 32.0 - 45.0 mmHg   Bicarbonate 21.3 20.0 - 28.0 mmol/L   TCO2 22 22 - 32 mmol/L   O2 Saturation 84.0 %   Acid-base deficit 3.0 (H) 0.0 - 2.0 mmol/L    Sodium 151 (H) 135 - 145 mmol/L   Potassium 3.6 3.5 - 5.1 mmol/L   Calcium, Ion 1.08 (L) 1.15 - 1.40 mmol/L   HCT 35.0 (L) 36.0 - 46.0 %   Hemoglobin 11.9 (L) 12.0 - 15.0 g/dL   Sample type VENOUS   CBG monitoring, ED     Status: Abnormal   Collection Time: 11/26/20  3:46 PM  Result Value Ref Range   Glucose-Capillary 504 (HH) 70 - 99 mg/dL    Comment: Glucose reference range applies only to samples taken after fasting for at least 8 hours.    Chemistries  Recent Labs  Lab 11/26/20 1214 11/26/20 1441 11/26/20 1451  NA 148* 151* 151*  K 4.6 3.6 3.6  CL 105  --   --   CO2 23  --   --   GLUCOSE 602*  --   --   BUN 55*  --   --   CREATININE 1.75*  --   --   CALCIUM 10.2  --   --   AST 16  --   --   ALT 20  --   --   ALKPHOS 65  --   --   BILITOT 1.0  --   --    ------------------------------------------------------------------------------------------------------------------  ------------------------------------------------------------------------------------------------------------------ GFR: Estimated Creatinine Clearance: 22.7 mL/min (A) (by C-G formula based on SCr of 1.75 mg/dL (H)). Liver Function Tests: Recent Labs  Lab 11/26/20 1214  AST 16  ALT 20  ALKPHOS 65  BILITOT 1.0  PROT 7.2  ALBUMIN 3.7   No results for input(s): LIPASE, AMYLASE in the last 168 hours. No results for input(s): AMMONIA in the last 168 hours. Coagulation Profile: Recent Labs  Lab 11/26/20 1229  INR 1.1   Cardiac Enzymes: No results for input(s): CKTOTAL, CKMB, CKMBINDEX, TROPONINI in the last 168 hours. BNP (last 3 results) No results for input(s): PROBNP in the last 8760 hours. HbA1C: No results for input(s): HGBA1C in the last 72 hours. CBG: Recent Labs  Lab 11/26/20 1203 11/26/20 1420 11/26/20 1546  GLUCAP 591* 486* 504*   Lipid Profile: No results for input(s): CHOL, HDL, LDLCALC, TRIG, CHOLHDL, LDLDIRECT in the last 72 hours. Thyroid Function Tests: No results for  input(s): TSH, T4TOTAL, FREET4, T3FREE, THYROIDAB in the last 72 hours. Anemia Panel: No results for input(s): VITAMINB12, FOLATE, FERRITIN, TIBC, IRON, RETICCTPCT in the last 72 hours.  --------------------------------------------------------------------------------------------------------------- Urine analysis:    Component Value Date/Time   COLORURINE YELLOW 11/26/2020 [place] 11/26/2020 1349   LABSPEC 1.026 11/26/2020 1349   PHURINE 5.0 11/26/2020 1349   GLUCOSEU >=500 (A) 11/26/2020 1349   HGBUR NEGATIVE 11/26/2020 1349   BILIRUBINUR NEGATIVE 11/26/2020 1349   KETONESUR 80 (A) 11/26/2020 1349   PROTEINUR NEGATIVE 11/26/2020 1349   NITRITE NEGATIVE 11/26/2020 1349   LEUKOCYTESUR NEGATIVE 11/26/2020 1349      Imaging Results:    CT HEAD WO CONTRAST (5MM)  Result Date: 11/26/2020 CLINICAL DATA:  Mental status changes of unknown cause. Hyperglycemia. EXAM: CT HEAD WITHOUT CONTRAST TECHNIQUE: Contiguous axial images were obtained from the base of the skull through the vertex without intravenous contrast. COMPARISON:  11/05/2020 FINDINGS: Brain: Generalized age related atrophy. There chronic small-vessel ischemic changes throughout the cerebral hemispheric white matter. Small acute/subacute cerebellar infarctions shown by recent MRI are not visible by CT. No mass, hemorrhage, hydrocephalus or extra-axial collection. Vascular: There is atherosclerotic calcification of the major vessels at the base of the brain. Skull: Negative Sinuses/Orbits: Clear/normal Other: None IMPRESSION: No acute finding by CT. Generalized atrophy. Extensive chronic small-vessel ischemic changes of the cerebral hemispheric white matter. Electronically Signed   By: Nelson Chimes M.D.   On: 11/26/2020 13:48   DG Chest Port 1 View  Result Date: 11/26/2020 CLINICAL DATA:  Altered mental status, hyperglycemia, possible sepsis. EXAM: PORTABLE CHEST 1 VIEW COMPARISON:  11/05/2020. FINDINGS: Patient is  rotated. Trachea is midline. Heart size stable. Lungs are low in volume with minimal bibasilar subsegmental atelectasis. No pleural fluid. IMPRESSION: Low lung volumes with minimal left basilar atelectasis. Electronically Signed   By: Lorin Picket M.D.   On: 11/26/2020 13:40    My personal review of EKG: Rhythm NSR, no ST-T changes   Assessment & Plan:    Active Problems:   DKA (diabetic ketoacidosis) ([place])   Diabetic ketoacidosis-patient has mild DKA, CO2 is 23, sodium 151.  Anion gap is 20.  Beta hydroxybutyrate is 4.07.  Will initiate DKA protocol, patient will be started on IV insulin, check BMP every 4 hours, will replace potassium per protocol. Altered mental status-likely due to above, CT head is unremarkable.  UA is clear.  MRI brain without contrast has been ordered by ED provider.  We will follow MRI results.  If mental status does not improve with improvement of DKA, consider neurology consultation. Recent large SCA stroke-oral medications on hold due to altered mental status, consider starting home medications when patient is more alert and taking p.o. Hypernatremia-patient has mild hypernatremia, likely from dehydration.  She also has mild AKI.  Currently on IV fluids per DKA protocol.  We will follow serum sodium level. Acute kidney injury-likely from poor p.o. intake, patient's creatinine was 1.14 on 11/11/2020, presented with creatinine of 1.75.  Started on IV fluids as above.  We will follow patient serum creatinine level. Hypertension-blood pressure is mildly elevated, patient takes metoprolol, amlodipine at home.  We will start metoprolol 2.5 mg IV every 8 hours as needed for BP greater than 160/100. History of dementia-consider starting Namenda, Zyprexa once patient is more alert.    DVT Prophylaxis-   Lovenox  AM Labs Ordered, also please review Full Orders  Family Communication: Admission, patients condition and plan of care including tests being ordered have been  discussed with the patient's daughter on phone who indicate understanding and agree with the plan and Code Status.  Code Status: Full code  Admission status: Inpatient :The appropriate admission status for this patient is INPATIENT. Inpatient status is judged to be reasonable and necessary in order to provide the required intensity of service to ensure the patient's safety. The patient's presenting symptoms, physical exam findings, and initial radiographic and laboratory data in the context of their chronic comorbidities is felt to place them at high risk for further clinical deterioration. Furthermore, it is not anticipated that the patient will be medically stable for discharge from the hospital within 2 midnights of admission. The following factors support the admission status of inpatient.         *  I certify that at the point of admission it is my clinical judgment that the patient will require inpatient hospital care spanning beyond 2 midnights from the point of admission due to high intensity of service, high risk for further deterioration and high frequency of surveillance required.*  Time spent in minutes : 60 min   Emalie Mcwethy S Ancelmo Hunt M.D

## 2020-11-26 NOTE — ED Triage Notes (Signed)
Pt arrives via EMS from [facility] with reports of AMS and hyperglycemia. Per staff, pt slept all night and into the morning which is unusual for her. Pt arrives with mouth full of breakfast, responds to painful  stimuli. CBG for staff reading high and gave 10 units insulin. CBG for EMS [phone].

## 2020-11-26 NOTE — ED Provider Notes (Signed)
[facility] EMERGENCY DEPARTMENT Provider Note   CSN: [id number] Arrival date & time: 11/26/20  1157     History Chief Complaint  Patient presents with   Hyperglycemia    Stacey Fuentes is a 85 y.o. female.  Patient presents to the emergency department by ambulance from skilled nursing facility.  Patient reportedly did not wake up like she normally does this morning.  When they checked on her she was altered.  Patient has an elevated blood sugar, reading "high" prior to arrival.  Patient altered and nonverbal at arrival, level 5 caveat     Past Medical History:  Diagnosis Date   Benign essential tremor    Cataract    CKD (chronic kidney disease), stage II    Diabetes mellitus ([place])    type2   Full dentures    Hyperlipidemia    Memory difficulty    Osteoporosis    Uncontrolled diabetes mellitus type 2 without complications 6/44/0347   Vitamin D deficiency    Wears glasses     Patient Active Problem List   Diagnosis Date Noted   Cerebellar infarct ([place]) 11/06/2020   Dementia without behavioral disturbance ([employer]) 11/06/2020   Type 2 diabetes mellitus with other specified complication ([place]) 42/59/5638   CKD (chronic kidney disease), stage III ([place]) 11/06/2020   Essential hypertension 07/12/2017   Orthostatic syncope 06/11/2017   Aortic ejection murmur 06/11/2017   Uncontrolled type 2 diabetes mellitus with nephropathy ([place]) 03/24/2014   Upper abdominal pain 03/23/2014   Nausea 03/23/2014   Abnormal findings on radiological examination of gastrointestinal tract 03/23/2014   Infected sebaceous cyst 04/15/2013    Past Surgical History:  Procedure Laterality Date   APPENDECTOMY     childhood   CATARACT EXTRACTION, BILATERAL  2014   COLONOSCOPY     in [place] in [place] N/A 05/18/2013   Procedure: EXCISION OF SEBACEOUS CYST BACK;  Surgeon: Merrie Roof, MD;  Location: [place];  Service: General;   Laterality: N/A;   surgery to remove cyst on back  2010   x 2   TRANSTHORACIC ECHOCARDIOGRAM  06/17/2017    EF 60-65%.  Normal wall motion.  GR 1 DD.  No obvious valvular lesions.   TUBAL LIGATION       OB History   No obstetric history on file.     Family History  Problem Relation Age of Onset   Tuberculosis Mother    Other Father        homicide   Colon cancer Neg Hx    Esophageal cancer Neg Hx    Rectal cancer Neg Hx    Stomach cancer Neg Hx     Social History   Tobacco Use   Smoking status: Never   Smokeless tobacco: Never  Vaping Use   Vaping Use: Never used  Substance Use Topics   Alcohol use: No   Drug use: No    Home Medications Prior to Admission medications   Medication Sig Start Date End Date Taking? Authorizing Provider  amLODipine (NORVASC) 5 MG tablet Take 5 mg by mouth daily.   Yes [provider]  aspirin 81 MG tablet Take 1 tablet (81 mg total) by mouth daily. 11/13/20  Yes [name], MD  atorvastatin (LIPITOR) 40 MG tablet Take 1 tablet (40 mg total) by mouth daily. Patient taking differently: Take 40 mg by mouth at bedtime. 11/13/20  Yes [name], MD  calcium carbonate (OSCAL)  1500 (600 Ca) MG TABS tablet Take 600 mg of elemental calcium by mouth daily with breakfast.   Yes [provider]  Cholecalciferol (VITAMIN D3 PO) Take 1,000 mg by mouth daily.   Yes [provider]  clopidogrel (PLAVIX) 75 MG tablet Take 1 tablet (75 mg total) by mouth daily. 11/13/20  Yes [name], MD  esomeprazole (NEXIUM) 20 MG capsule Take 40 mg by mouth daily at 12 noon.   Yes [provider]  famotidine (PEPCID) 20 MG tablet Take 1 tablet (20 mg total) by mouth 2 (two) times daily as needed for heartburn or indigestion. Patient taking differently: Take 20 mg by mouth 2 (two) times daily. 09/25/17  Yes Drenda Freeze, MD  haloperidol (HALDOL) 0.5 MG tablet Take 1 tablet (0.5 mg total) by mouth every  8 (eight) hours as needed for agitation (agitation). 11/13/20  Yes [name], MD  insulin aspart (NOVOLOG) 100 UNIT/ML injection Inject 0-10 Units into the skin as directed. Sliding Scale: 0-59 Notify MD, 60-150=0 units, 151-199=2 units, 200-249=4 units, 250-299=6 units, 300-349=8 units, 350-399=10 units, [phone] Notify MD   Yes [provider]  insulin glargine-yfgn (SEMGLEE) 100 UNIT/ML injection Inject 0.1 mLs (10 Units total) into the skin daily. Patient taking differently: Inject 15 Units into the skin at bedtime. 11/14/20  Yes [name], MD  latanoprost (XALATAN) 0.005 % ophthalmic solution Place 1 drop into both eyes every evening. 07/11/17  Yes [provider]  melatonin 5 MG TABS Take 10 mg by mouth at bedtime.   Yes [provider]  memantine (NAMENDA) 5 MG tablet Take 1 tablet (5 mg total) by mouth daily. 11/13/20  Yes [name], MD  metoprolol succinate (TOPROL-XL) 25 MG 24 hr tablet Take 0.5 tablets (12.5 mg total) by mouth daily. 11/13/20  Yes [name], MD  Multiple Vitamin (MULTIVITAMIN WITH MINERALS) TABS tablet Take 1 tablet by mouth daily.   Yes [provider]  OLANZapine zydis (ZYPREXA) 5 MG disintegrating tablet Take 1 tablet (5 mg total) by mouth daily at 4 PM. 11/13/20  Yes [name], MD  Omega-3 Fatty Acids (FISH OIL) 1000 MG CAPS Take 1 capsule by mouth daily.   Yes [provider]  vitamin B-12 (CYANOCOBALAMIN) 1000 MCG tablet Take 1,000 mcg by mouth daily.   Yes [provider]  ACCU-CHEK SOFTCLIX LANCETS lancets Use to check blood sugars twice daily 04/28/17   Philemon Kingdom, MD  amLODipine (NORVASC) 5 MG tablet Take 1 tablet (5 mg total) by mouth daily. 05/18/19 09/06/19  Wyvonnia Dusky, MD  esomeprazole (NEXIUM) 40 MG capsule Take 1 capsule (40 mg total) by mouth daily. Patient not taking: Reported on 11/26/2020 09/25/17   Drenda Freeze, MD  glucose blood  (ACCU-CHEK AVIVA) test strip Use to check blood sugars twice daily 04/28/17   Philemon Kingdom, MD  Insulin Pen Needle (CAREFINE PEN NEEDLES) 32G X 4 MM MISC Use 4x a day 03/24/14   Philemon Kingdom, MD    Allergies    Metformin and related and Saxagliptin  Review of Systems   Review of Systems  Unable to perform ROS: Dementia   Physical Exam Updated Vital Signs BP (!) 150/84   Pulse (!) 119   Temp (!) 97.3 F (36.3 C)   Resp 18   Ht 5\' 2"  (1.575 m)   Wt 72.6 kg   SpO2 97%   BMI 29.26 kg/m   Physical Exam Vitals and nursing note reviewed.  Constitutional:  Appearance: She is not toxic-appearing.  HENT:     Head: Normocephalic.     Comments: Food debris noted in mouth Eyes:     Pupils: Pupils are equal, round, and reactive to light.  Cardiovascular:     Rate and Rhythm: Regular rhythm. Tachycardia present.     Heart sounds: No murmur heard.   No friction rub. No gallop.  Pulmonary:     Effort: Tachypnea present.  Abdominal:     Palpations: Abdomen is soft.  Musculoskeletal:     Cervical back: Neck supple.  Skin:    General: Skin is warm.     Findings: No lesion.  Neurological:     Mental Status: She is lethargic.     Cranial Nerves: Cranial nerves are intact.    ED Results / Procedures / Treatments   Labs (all labs ordered are listed, but only abnormal results are displayed) Labs Reviewed  LACTIC ACID, PLASMA - Abnormal; Notable for the following components:      Result Value   Lactic Acid, Venous 2.4 (*)    All other components within normal limits  COMPREHENSIVE METABOLIC PANEL - Abnormal; Notable for the following components:   Sodium 148 (*)    Glucose, Bld 602 (*)    BUN 55 (*)    Creatinine, Ser 1.75 (*)    GFR, Estimated 29 (*)    Anion gap 20 (*)    All other components within normal limits  CBC WITH DIFFERENTIAL/PLATELET - Abnormal; Notable for the following components:   WBC 14.0 (*)    MCV 100.2 (*)    Platelets 134 (*)    Neutro  Abs 12.4 (*)    All other components within normal limits  URINALYSIS, ROUTINE W REFLEX MICROSCOPIC - Abnormal; Notable for the following components:   Glucose, UA >=500 (*)    Ketones, ur 80 (*)    All other components within normal limits  BETA-HYDROXYBUTYRIC ACID - Abnormal; Notable for the following components:   Beta-Hydroxybutyric Acid 4.07 (*)    All other components within normal limits  APTT - Abnormal; Notable for the following components:   aPTT 22 (*)    All other components within normal limits  CBG MONITORING, ED - Abnormal; Notable for the following components:   Glucose-Capillary 591 (*)    All other components within normal limits  I-STAT VENOUS BLOOD GAS, ED - Abnormal; Notable for the following components:   pCO2, Ven 37.5 (*)    Acid-base deficit 3.0 (*)    Sodium 151 (*)    Calcium, Ion 1.09 (*)    HCT 35.0 (*)    Hemoglobin 11.9 (*)    All other components within normal limits  CBG MONITORING, ED - Abnormal; Notable for the following components:   Glucose-Capillary 486 (*)    All other components within normal limits  I-STAT VENOUS BLOOD GAS, ED - Abnormal; Notable for the following components:   pCO2, Ven 36.4 (*)    pO2, Ven 50.0 (*)    Acid-base deficit 3.0 (*)    Sodium 151 (*)    Calcium, Ion 1.08 (*)    HCT 35.0 (*)    Hemoglobin 11.9 (*)    All other components within normal limits  RESP PANEL BY RT-PCR (FLU A&B, COVID) ARPGX2  CULTURE, BLOOD (SINGLE)  URINE CULTURE  PROTIME-INR  LACTIC ACID, PLASMA    EKG EKG Interpretation  Date/Time:  Monday November 26 2020 12:00:43 EDT Ventricular Rate:  119 PR Interval:  117  QRS Duration: 89 QT Interval:  309 QTC Calculation: 435 R Axis:   66 Text Interpretation: Sinus tachycardia Multiform ventricular premature complexes Aberrant conduction of SV complex(es) Abnormal R-wave progression, early transition Confirmed by Orpah Greek [phone]) on 11/26/2020 12:55:29 PM  Radiology CT HEAD WO  CONTRAST (5MM)  Result Date: 11/26/2020 CLINICAL DATA:  Mental status changes of unknown cause. Hyperglycemia. EXAM: CT HEAD WITHOUT CONTRAST TECHNIQUE: Contiguous axial images were obtained from the base of the skull through the vertex without intravenous contrast. COMPARISON:  11/05/2020 FINDINGS: Brain: Generalized age related atrophy. There chronic small-vessel ischemic changes throughout the cerebral hemispheric white matter. Small acute/subacute cerebellar infarctions shown by recent MRI are not visible by CT. No mass, hemorrhage, hydrocephalus or extra-axial collection. Vascular: There is atherosclerotic calcification of the major vessels at the base of the brain. Skull: Negative Sinuses/Orbits: Clear/normal Other: None IMPRESSION: No acute finding by CT. Generalized atrophy. Extensive chronic small-vessel ischemic changes of the cerebral hemispheric white matter. Electronically Signed   By: Nelson Chimes M.D.   On: 11/26/2020 13:48   DG Chest Port 1 View  Result Date: 11/26/2020 CLINICAL DATA:  Altered mental status, hyperglycemia, possible sepsis. EXAM: PORTABLE CHEST 1 VIEW COMPARISON:  11/05/2020. FINDINGS: Patient is rotated. Trachea is midline. Heart size stable. Lungs are low in volume with minimal bibasilar subsegmental atelectasis. No pleural fluid. IMPRESSION: Low lung volumes with minimal left basilar atelectasis. Electronically Signed   By: Lorin Picket M.D.   On: 11/26/2020 13:40    Procedures Procedures   Medications Ordered in ED Medications  insulin regular, human (MYXREDLIN) 100 units/ 100 mL infusion (has no administration in time range)  lactated ringers infusion (has no administration in time range)  dextrose 5 % in lactated ringers infusion (has no administration in time range)  dextrose 50 % solution 0-50 mL (has no administration in time range)  potassium chloride 10 mEq in 100 mL IVPB (has no administration in time range)  sodium chloride 0.9 % bolus 1,000 mL (0  mLs Intravenous Stopped 11/26/20 1337)    ED Course  I have reviewed the triage vital signs and the nursing notes.  Pertinent labs & imaging results that were available during my care of the patient were reviewed by me and considered in my medical decision making (see chart for details).    MDM Rules/Calculators/A&P                           Patient brought to the emergency department for evaluation of altered mental status.  Patient was treated for cerebellar stroke at the end of September.  She is currently in a skilled nursing facility since that hospitalization.  Staff report that she did not wake up this morning like she normally does.  They checked on her and she was altered.  Blood sugar was critically high.  She was sent to the ER for further evaluation.  Patient confused and mildly agitated at arrival.  Blood sugar 602 at arrival.  She has an anion gap of 20.  Beta hydroxybutyric acid is elevated.  VBG, however, does not show significant acidosis.  Initiate treatment for mild DKA.  Remainder of her work-up has been largely unremarkable.  No obvious infections to cause the elevated blood sugars.  CT head does not show any acute abnormality.  With her recent stroke, will perform MRI to further evaluate.  This can be performed after admission.  We will admit the patient for  further management  CRITICAL CARE Performed by: Orpah Greek   Total critical care time: 31 minutes  Critical care time was exclusive of separately billable procedures and treating other patients.  Critical care was necessary to treat or prevent imminent or life-threatening deterioration.  Critical care was time spent personally by me on the following activities: development of treatment plan with patient and/or surrogate as well as nursing, discussions with consultants, evaluation of patient's response to treatment, examination of patient, obtaining history from patient or surrogate, ordering and  performing treatments and interventions, ordering and review of laboratory studies, ordering and review of radiographic studies, pulse oximetry and re-evaluation of patient's condition.  Final Clinical Impression(s) / ED Diagnoses Final diagnoses:  Diabetic ketoacidosis without coma associated with type 2 diabetes mellitus ([place])    Rx / DC Orders ED Discharge Orders     None        Kagan Hietpas, Gwenyth Allegra, MD 11/26/20 1521

## 2020-11-26 NOTE — ED Notes (Signed)
Patient transported to CT 

## 2020-11-26 NOTE — ED Notes (Signed)
Got patient undressed on the monitor ekg shown to Dr Betsey Holiday patient is resting with call bell in reach did a blood sugar it was 591 notified RN of blood sugar

## 2020-11-26 NOTE — ED Notes (Signed)
Endotool orders to keep insulin at 11

## 2020-11-27 ENCOUNTER — Inpatient Hospital Stay (HOSPITAL_COMMUNITY): Payer: Medicare Other

## 2020-11-27 DIAGNOSIS — R4182 Altered mental status, unspecified: Secondary | ICD-10-CM

## 2020-11-27 DIAGNOSIS — I10 Essential (primary) hypertension: Secondary | ICD-10-CM

## 2020-11-27 DIAGNOSIS — E870 Hyperosmolality and hypernatremia: Secondary | ICD-10-CM

## 2020-11-27 DIAGNOSIS — G9341 Metabolic encephalopathy: Secondary | ICD-10-CM

## 2020-11-27 LAB — BASIC METABOLIC PANEL
Anion gap: 8 (ref 5–15)
Anion gap: 7 (ref 5–15)
Anion gap: 8 (ref 5–15)
Anion gap: 5 (ref 5–15)
Anion gap: 7 (ref 5–15)
BUN: 44 mg/dL — ABNORMAL HIGH (ref 8–23)
BUN: 41 mg/dL — ABNORMAL HIGH (ref 8–23)
BUN: 29 mg/dL — ABNORMAL HIGH (ref 8–23)
BUN: 28 mg/dL — ABNORMAL HIGH (ref 8–23)
BUN: 26 mg/dL — ABNORMAL HIGH (ref 8–23)
CO2: 23 mmol/L (ref 22–32)
CO2: 26 mmol/L (ref 22–32)
CO2: 25 mmol/L (ref 22–32)
CO2: 25 mmol/L (ref 22–32)
CO2: 25 mmol/L (ref 22–32)
Calcium: 9.4 mg/dL (ref 8.9–10.3)
Calcium: 9.2 mg/dL (ref 8.9–10.3)
Calcium: 9.2 mg/dL (ref 8.9–10.3)
Calcium: 9.3 mg/dL (ref 8.9–10.3)
Calcium: 9 mg/dL (ref 8.9–10.3)
Chloride: 119 mmol/L — ABNORMAL HIGH (ref 98–111)
Chloride: 118 mmol/L — ABNORMAL HIGH (ref 98–111)
Chloride: 118 mmol/L — ABNORMAL HIGH (ref 98–111)
Chloride: 119 mmol/L — ABNORMAL HIGH (ref 98–111)
Chloride: 117 mmol/L — ABNORMAL HIGH (ref 98–111)
Creatinine, Ser: 1.11 mg/dL — ABNORMAL HIGH (ref 0.44–1.00)
Creatinine, Ser: 1.01 mg/dL — ABNORMAL HIGH (ref 0.44–1.00)
Creatinine, Ser: 0.88 mg/dL (ref 0.44–1.00)
Creatinine, Ser: 0.9 mg/dL (ref 0.44–1.00)
Creatinine, Ser: 1.01 mg/dL — ABNORMAL HIGH (ref 0.44–1.00)
GFR, Estimated: 49 mL/min — ABNORMAL LOW (ref >60)
GFR, Estimated: 55 mL/min — ABNORMAL LOW (ref >60)
GFR, Estimated: >60 mL/min (ref >60)
GFR, Estimated: >60 mL/min (ref >60)
GFR, Estimated: 55 mL/min — ABNORMAL LOW (ref >60)
Glucose, Bld: 217 mg/dL — ABNORMAL HIGH (ref 70–99)
Glucose, Bld: 334 mg/dL — ABNORMAL HIGH (ref 70–99)
Glucose, Bld: 178 mg/dL — ABNORMAL HIGH (ref 70–99)
Glucose, Bld: 195 mg/dL — ABNORMAL HIGH (ref 70–99)
Glucose, Bld: 225 mg/dL — ABNORMAL HIGH (ref 70–99)
Potassium: 3.9 mmol/L (ref 3.5–5.1)
Potassium: 4.2 mmol/L (ref 3.5–5.1)
Potassium: 3.9 mmol/L (ref 3.5–5.1)
Potassium: 3.7 mmol/L (ref 3.5–5.1)
Potassium: 3.4 mmol/L — ABNORMAL LOW (ref 3.5–5.1)
Sodium: 150 mmol/L — ABNORMAL HIGH (ref 135–145)
Sodium: 151 mmol/L — ABNORMAL HIGH (ref 135–145)
Sodium: 151 mmol/L — ABNORMAL HIGH (ref 135–145)
Sodium: 149 mmol/L — ABNORMAL HIGH (ref 135–145)
Sodium: 149 mmol/L — ABNORMAL HIGH (ref 135–145)

## 2020-11-27 LAB — URINE CULTURE: Culture: NO GROWTH

## 2020-11-27 LAB — CBG MONITORING, ED
Glucose-Capillary: 199 mg/dL — ABNORMAL HIGH (ref 70–99)
Glucose-Capillary: 190 mg/dL — ABNORMAL HIGH (ref 70–99)
Glucose-Capillary: 186 mg/dL — ABNORMAL HIGH (ref 70–99)
Glucose-Capillary: 144 mg/dL — ABNORMAL HIGH (ref 70–99)
Glucose-Capillary: 190 mg/dL — ABNORMAL HIGH (ref 70–99)
Glucose-Capillary: 183 mg/dL — ABNORMAL HIGH (ref 70–99)
Glucose-Capillary: 189 mg/dL — ABNORMAL HIGH (ref 70–99)
Glucose-Capillary: 186 mg/dL — ABNORMAL HIGH (ref 70–99)
Glucose-Capillary: 147 mg/dL — ABNORMAL HIGH (ref 70–99)
Glucose-Capillary: 181 mg/dL — ABNORMAL HIGH (ref 70–99)

## 2020-11-27 LAB — BETA-HYDROXYBUTYRIC ACID: Beta-Hydroxybutyric Acid: 0.24 mmol/L (ref 0.05–0.27)

## 2020-11-27 LAB — GLUCOSE, CAPILLARY
Glucose-Capillary: 174 mg/dL — ABNORMAL HIGH (ref 70–99)
Glucose-Capillary: 177 mg/dL — ABNORMAL HIGH (ref 70–99)
Glucose-Capillary: 183 mg/dL — ABNORMAL HIGH (ref 70–99)
Glucose-Capillary: 224 mg/dL — ABNORMAL HIGH (ref 70–99)
Glucose-Capillary: 203 mg/dL — ABNORMAL HIGH (ref 70–99)
Glucose-Capillary: 203 mg/dL — ABNORMAL HIGH (ref 70–99)

## 2020-11-27 MED ORDER — PANTOPRAZOLE SODIUM 40 MG IV SOLR
40.0000 mg | INTRAVENOUS | Status: DC
  Administered 2020-11-27 – 2020-11-30 (×4): 40 mg via INTRAVENOUS
  Filled 2020-11-27 (×5): qty 40

## 2020-11-27 MED ORDER — HALOPERIDOL LACTATE 5 MG/ML IJ SOLN
2.0000 mg | Freq: Four times a day (QID) | INTRAMUSCULAR | Status: DC | PRN
  Administered 2020-11-29 – 2020-11-30 (×2): 2 mg via INTRAVENOUS
  Filled 2020-11-27 (×2): qty 1

## 2020-11-27 MED ORDER — METOPROLOL TARTRATE 5 MG/5ML IV SOLN: 2.5000 mg | Freq: Three times a day (TID) | INTRAVENOUS | Status: DC | PRN

## 2020-11-27 MED ORDER — ENOXAPARIN SODIUM 40 MG/0.4ML IJ SOSY
40.0000 mg | PREFILLED_SYRINGE | INTRAMUSCULAR | Status: DC
  Administered 2020-11-27 – 2020-12-01 (×5): 40 mg via SUBCUTANEOUS
  Filled 2020-11-27 (×5): qty 0.4

## 2020-11-27 NOTE — ED Notes (Signed)
MD Rathore paged about beginning process off of EndoTool.

## 2020-11-27 NOTE — ED Notes (Signed)
Per endo tool insulin rate no change

## 2020-11-27 NOTE — ED Notes (Signed)
EEG being placed.

## 2020-11-27 NOTE — Progress Notes (Signed)
PROGRESS NOTE        PATIENT DETAILS Name: Stacey Fuentes Age: 85 y.o. Sex: female Date of Birth: 1936/01/09 Admit Date: 11/26/2020 Admitting Physician Oswald Hillock, MD JQB:HALPF, Caren Griffins, MD  Brief Narrative: Patient is a 85 y.o. female with history of dementia, DM-2, recent CVA-presented to the hospital with altered mental status-found to have DKA and subsequently admitted to the hospitalist service.  Subjective: Not verbal-opens eyes-not following commands.  Seems to be moving all 4 extremities-but left side seems more fluent in the right side.  No family at bedside.  Objective: Vitals: Blood pressure (!) 168/145, pulse (!) 110, temperature (!) 97.3 F (36.3 C), resp. rate 20, height 5\' 2"  (1.575 m), weight 72.6 kg, SpO2 93 %.   Exam: Gen Exam: Awakes to a loud verbal stimuli-but not interacting-not in any distress. HEENT:atraumatic, normocephalic Chest: B/L clear to auscultation anteriorly CVS:S1S2 regular Abdomen:soft non tender, non distended Extremities:no edema Neurology: Difficult exam-seems to be moving all 4 extremities although left side seems to be more fluent than the right.   Skin: no rash  Pertinent Labs/Radiology: Na: 151 K: 4.2 Creatinine: 1.01 Anion gap: 7  10/17>>Blood culture: No growth 10/17>>Urine Culture: Pending  10/17>>CXR: No pneumonia 10/17>> CT head: No acute findings.  Assessment/Plan: DKA: Resolved-we will keep on insulin infusion until mental status improves/oral intake resumes.  Acute metabolic encephalopathy superimposed on dementia: Still very lethargic-awake but not really following commands.  Suspect etiology of encephalopathy is from DKA, AKI and hypernatremia.  Given the fact that she recently had a stroke--equivocal exam-awaiting MRI brain.  EEG done-results also pending.  Hypernatremia: Continue IVF-await repeat electrolytes this morning.  AKI: Likely hemodynamically mediated/osmotic diuresis in the  setting of DKA-improved-continue supportive care.  HTN: BP fluctuating-but relatively well controlled this morning-allow some amount of permissive hypertension until acute CVA ruled out.  HLD: Continue to hold statin until oral intake improves/mental status improves.  History of recent cerebellar vermis/left cerebral hemisphere September 2022: Resume antiplatelets when able/mental status improves-awaiting repeat MRI brain  Dementia/delirium: Currently encephalopathic-see above-unclear what her usual baseline is like-continue to hold all usual medications until mental status/oral intake more stable.  Per daughter whom I spoke to over the phone-patient to last week was much more interactive-and able to communicate with family.  Procedures: None Consults: None DVT Prophylaxis: Lovenox Code Status:Full code Family Communication: Daughter-[name] Prescott-[phone] updated over the phone on 10/18  Time spent: 35 minutes-Greater than 50% of this time was spent in counseling, explanation of diagnosis, planning of further management, and coordination of care.  Diet: Diet Order             Diet NPO time specified  Diet effective now                      Disposition Plan: Status is: Inpatient  Remains inpatient appropriate because: Needs inpatient level of treatment/IV medications.  Barriers to Discharge: Improving DKA-improving AKI-improving hypernatremia-still with severe encephalopathy.  Not yet stable for discharge.  Antimicrobial agents: Anti-infectives (From admission, onward)    None        MEDICATIONS: Scheduled Meds: Continuous Infusions:  dextrose 5% lactated ringers 125 mL/hr at 11/27/20 0618   insulin 3.8 Units/hr (11/27/20 0959)   lactated ringers Stopped (11/26/20 2156)   PRN Meds:.dextrose, metoprolol tartrate   I have personally reviewed following labs and imaging  studies  LABORATORY DATA: CBC: Recent Labs  Lab 11/26/20 1214 11/26/20 1441  11/26/20 1451  WBC 14.0*  --   --   NEUTROABS 12.4*  --   --   HGB 14.0 11.9* 11.9*  HCT 45.8 35.0* 35.0*  MCV 100.2*  --   --   PLT 134*  --   --     Basic Metabolic Panel: Recent Labs  Lab 11/26/20 1214 11/26/20 1441 11/26/20 1451 11/26/20 1621 11/26/20 2021 11/27/20 0247 11/27/20 0343  NA 148*   < > 151* 147* 153* 150* 151*  K 4.6   < > 3.6 4.2 4.6 3.9 4.2  CL 105  --   --  115* 120* 119* 118*  CO2 23  --   --  23 23 23 26   GLUCOSE 602*  --   --  457* 229* 217* 334*  BUN 55*  --   --  53* 52* 44* 41*  CREATININE 1.75*  --   --  1.41* 1.22* 1.11* 1.01*  CALCIUM 10.2  --   --  9.5 10.0 9.4 9.2   < > = values in this interval not displayed.    GFR: Estimated Creatinine Clearance: 39.4 mL/min (A) (by C-G formula based on SCr of 1.01 mg/dL (H)).  Liver Function Tests: Recent Labs  Lab 11/26/20 1214  AST 16  ALT 20  ALKPHOS 65  BILITOT 1.0  PROT 7.2  ALBUMIN 3.7   No results for input(s): LIPASE, AMYLASE in the last 168 hours. No results for input(s): AMMONIA in the last 168 hours.  Coagulation Profile: Recent Labs  Lab 11/26/20 1229  INR 1.1    Cardiac Enzymes: No results for input(s): CKTOTAL, CKMB, CKMBINDEX, TROPONINI in the last 168 hours.  BNP (last 3 results) No results for input(s): PROBNP in the last 8760 hours.  Lipid Profile: No results for input(s): CHOL, HDL, LDLCALC, TRIG, CHOLHDL, LDLDIRECT in the last 72 hours.  Thyroid Function Tests: No results for input(s): TSH, T4TOTAL, FREET4, T3FREE, THYROIDAB in the last 72 hours.  Anemia Panel: No results for input(s): VITAMINB12, FOLATE, FERRITIN, TIBC, IRON, RETICCTPCT in the last 72 hours.  Urine analysis:    Component Value Date/Time   COLORURINE YELLOW 11/26/2020 [place] 11/26/2020 1349   LABSPEC 1.026 11/26/2020 1349   PHURINE 5.0 11/26/2020 1349   GLUCOSEU >=500 (A) 11/26/2020 1349   HGBUR NEGATIVE 11/26/2020 [place] 11/26/2020 1349    KETONESUR 80 (A) 11/26/2020 1349   PROTEINUR NEGATIVE 11/26/2020 1349   NITRITE NEGATIVE 11/26/2020 1349   LEUKOCYTESUR NEGATIVE 11/26/2020 1349    Sepsis Labs: Lactic Acid, Venous    Component Value Date/Time   LATICACIDVEN 2.2 (HH) 11/26/2020 1420    MICROBIOLOGY: Recent Results (from the past 240 hour(s))  Resp Panel by RT-PCR (Flu A&B, Covid) Nasopharyngeal Swab     Status: None   Collection Time: 11/26/20 12:14 PM   Specimen: Nasopharyngeal Swab; Nasopharyngeal(NP) swabs in vial transport medium  Result Value Ref Range Status   SARS Coronavirus 2 by RT PCR NEGATIVE NEGATIVE Final    Comment: (NOTE) SARS-CoV-2 target nucleic acids are NOT DETECTED.  The SARS-CoV-2 RNA is generally detectable in upper respiratory specimens during the acute phase of infection. The lowest concentration of SARS-CoV-2 viral copies this assay can detect is 138 copies/mL. A negative result does not preclude SARS-Cov-2 infection and should not be used as the sole basis for treatment or other patient management decisions. A negative result  may occur with  improper specimen collection/handling, submission of specimen other than nasopharyngeal swab, presence of viral mutation(s) within the areas targeted by this assay, and inadequate number of viral copies(<138 copies/mL). A negative result must be combined with clinical observations, patient history, and epidemiological information. The expected result is Negative.  Fact Sheet for Patients:  EntrepreneurPulse.com.au  Fact Sheet for Healthcare Providers:  IncredibleEmployment.be  This test is no t yet approved or cleared by the [place] FDA and  has been authorized for detection and/or diagnosis of SARS-CoV-2 by FDA under an Emergency Use Authorization (EUA). This EUA will remain  in effect (meaning this test can be used) for the duration of the COVID-19 declaration under Section 564(b)(1) of the Act,  21 U.S.C.section 360bbb-3(b)(1), unless the authorization is terminated  or revoked sooner.       Influenza A by PCR NEGATIVE NEGATIVE Final   Influenza B by PCR NEGATIVE NEGATIVE Final    Comment: (NOTE) The Xpert Xpress SARS-CoV-2/FLU/RSV plus assay is intended as an aid in the diagnosis of influenza from Nasopharyngeal swab specimens and should not be used as a sole basis for treatment. Nasal washings and aspirates are unacceptable for Xpert Xpress SARS-CoV-2/FLU/RSV testing.  Fact Sheet for Patients: EntrepreneurPulse.com.au  Fact Sheet for Healthcare Providers: IncredibleEmployment.be  This test is not yet approved or cleared by the [place] FDA and has been authorized for detection and/or diagnosis of SARS-CoV-2 by FDA under an Emergency Use Authorization (EUA). This EUA will remain in effect (meaning this test can be used) for the duration of the COVID-19 declaration under Section 564(b)(1) of the Act, 21 U.S.C. section 360bbb-3(b)(1), unless the authorization is terminated or revoked.  Performed at [facility], [place] [address], [place], [place] [address]   Blood culture (routine single)     Status: None (Preliminary result)   Collection Time: 11/26/20 12:29 PM   Specimen: BLOOD RIGHT FOREARM  Result Value Ref Range Status   Specimen Description BLOOD RIGHT FOREARM  Final   Special Requests   Final    BOTTLES DRAWN AEROBIC AND ANAEROBIC Blood Culture adequate volume   Culture   Final    NO GROWTH < 24 HOURS Performed at [facility], [place] [address], [place], [place] [address]    Report Status PENDING  Incomplete    RADIOLOGY STUDIES/RESULTS: CT HEAD WO CONTRAST (5MM)  Result Date: 11/26/2020 CLINICAL DATA:  Mental status changes of unknown cause. Hyperglycemia. EXAM: CT HEAD WITHOUT CONTRAST TECHNIQUE: Contiguous axial images were obtained from the base of the skull through the vertex without intravenous  contrast. COMPARISON:  11/05/2020 FINDINGS: Brain: Generalized age related atrophy. There chronic small-vessel ischemic changes throughout the cerebral hemispheric white matter. Small acute/subacute cerebellar infarctions shown by recent MRI are not visible by CT. No mass, hemorrhage, hydrocephalus or extra-axial collection. Vascular: There is atherosclerotic calcification of the major vessels at the base of the brain. Skull: Negative Sinuses/Orbits: Clear/normal Other: None IMPRESSION: No acute finding by CT. Generalized atrophy. Extensive chronic small-vessel ischemic changes of the cerebral hemispheric white matter. Electronically Signed   By: Nelson Chimes M.D.   On: 11/26/2020 13:48   DG Chest Port 1 View  Result Date: 11/26/2020 CLINICAL DATA:  Altered mental status, hyperglycemia, possible sepsis. EXAM: PORTABLE CHEST 1 VIEW COMPARISON:  11/05/2020. FINDINGS: Patient is rotated. Trachea is midline. Heart size stable. Lungs are low in volume with minimal bibasilar subsegmental atelectasis. No pleural fluid. IMPRESSION: Low lung volumes with minimal left basilar  atelectasis. Electronically Signed   By: Lorin Picket M.D.   On: 11/26/2020 13:40     LOS: 1 day   Oren Binet, MD  [facility]    To contact the attending provider between 7A-7P or the covering provider during after hours 7P-7A, please log into the web site [email or web address] and access using universal [place] password for that web site. If you do not have the password, please call the hospital operator.  11/27/2020, 10:50 AM

## 2020-11-27 NOTE — Procedures (Signed)
Patient Name: Stacey Fuentes  MRN: [id number]  Epilepsy Attending: Lora Havens  Referring Physician/Provider: Dr Oren Binet Date: 11/27/2020 Duration: 23.19 mins  Patient history: [age] year old female with altered mental status.  EEG to evaluate for seizures.  Level of alertness: lethargic  AEDs during EEG study: None  Technical aspects: This EEG study was done with scalp electrodes positioned according to the 10-20 International system of electrode placement. Electrical activity was acquired at a sampling rate of 500Hz  and reviewed with a high frequency filter of 70Hz  and a low frequency filter of 1Hz . EEG data were recorded continuously and digitally stored.   Description: No clear posterior dominant rhythm was seen. EEG showed continuous generalized 3-5 hz theta- delta slowing, at times with triphasic morphology.  Hyperventilation and photic stimulation were not performed.     ABNORMALITY - Continuous slow, generalized  IMPRESSION: This study is suggestive of moderate diffuse encephalopathy, nonspecific etiology but could be secondary to toxic-metabolic causes. No seizures or epileptiform discharges were seen throughout the recording.  Stacey Fuentes

## 2020-11-27 NOTE — Progress Notes (Signed)
Per MD ok to hold insulin drip while going to MRI

## 2020-11-27 NOTE — Progress Notes (Signed)
SLP Cancellation Note  Patient Details Name: Stacey Fuentes MRN: [id number] DOB: 09-06-1935   Cancelled treatment:       Reason Eval/Treat Not Completed: Fatigue/lethargy limiting ability to participate  RN reports pt is currently not responding. Will continue efforts.   Kip Kautzman B. Quentin Ore, [facility], [place] Speech Language Pathologist Office: [phone]  Shonna Chock 11/27/2020, 8:54 AM

## 2020-11-27 NOTE — Progress Notes (Signed)
EEG complete - results pending 

## 2020-11-27 NOTE — Progress Notes (Signed)
OT Cancellation Note  Patient Details Name: Stacey Fuentes MRN: [id number] DOB: 05/22/1935   Cancelled Treatment:    Reason Eval/Treat Not Completed: Fatigue/lethargy limiting ability to participate (Lethargic and poor arousal per RN. Will hold and return as schedule allows. Thank you.)  [place], OTR/L Acute Rehab Pager: [phone] Office: [phone] 11/27/2020, 9:12 AM

## 2020-11-27 NOTE — ED Notes (Signed)
Per MD [place] pt can't transition off of EndoTool until pt eats. MD wants pt to continue drip for 1-2 hours.

## 2020-11-27 NOTE — Progress Notes (Signed)
PT Cancellation Note  Patient Details Name: Stacey Fuentes MRN: [id number] DOB: Mar 06, 1935   Cancelled Treatment:    Reason Eval/Treat Not Completed: Patient's level of consciousness, pt minimally responsive today. Will recheck tomorrow.   Leighton Roach, PT  Acute Rehab Services  Pager [phone] Office [place] 11/27/2020, 4:20 PM

## 2020-11-28 DIAGNOSIS — F03918 Unspecified dementia, unspecified severity, with other behavioral disturbance: Secondary | ICD-10-CM

## 2020-11-28 DIAGNOSIS — E876 Hypokalemia: Secondary | ICD-10-CM

## 2020-11-28 LAB — BASIC METABOLIC PANEL
Anion gap: 8 (ref 5–15)
BUN: 24 mg/dL — ABNORMAL HIGH (ref 8–23)
CO2: 24 mmol/L (ref 22–32)
Calcium: 8.7 mg/dL — ABNORMAL LOW (ref 8.9–10.3)
Chloride: 111 mmol/L (ref 98–111)
Creatinine, Ser: 1.13 mg/dL — ABNORMAL HIGH (ref 0.44–1.00)
GFR, Estimated: 48 mL/min — ABNORMAL LOW (ref >60)
Glucose, Bld: 184 mg/dL — ABNORMAL HIGH (ref 70–99)
Potassium: 3 mmol/L — ABNORMAL LOW (ref 3.5–5.1)
Sodium: 143 mmol/L (ref 135–145)

## 2020-11-28 LAB — GLUCOSE, CAPILLARY
Glucose-Capillary: 71 mg/dL (ref 70–99)
Glucose-Capillary: 84 mg/dL (ref 70–99)
Glucose-Capillary: 159 mg/dL — ABNORMAL HIGH (ref 70–99)
Glucose-Capillary: 178 mg/dL — ABNORMAL HIGH (ref 70–99)
Glucose-Capillary: 150 mg/dL — ABNORMAL HIGH (ref 70–99)
Glucose-Capillary: 161 mg/dL — ABNORMAL HIGH (ref 70–99)
Glucose-Capillary: 236 mg/dL — ABNORMAL HIGH (ref 70–99)
Glucose-Capillary: 206 mg/dL — ABNORMAL HIGH (ref 70–99)
Glucose-Capillary: 186 mg/dL — ABNORMAL HIGH (ref 70–99)

## 2020-11-28 MED ORDER — INSULIN ASPART 100 UNIT/ML IJ SOLN
0.0000 [IU] | Freq: Three times a day (TID) | INTRAMUSCULAR | Status: DC
  Administered 2020-11-28: 3 [IU] via SUBCUTANEOUS
  Administered 2020-11-29: 5 [IU] via SUBCUTANEOUS
  Administered 2020-11-29: 2 [IU] via SUBCUTANEOUS
  Administered 2020-11-30: 3 [IU] via SUBCUTANEOUS
  Administered 2020-11-30: 1 [IU] via SUBCUTANEOUS
  Administered 2020-11-30: 3 [IU] via SUBCUTANEOUS
  Administered 2020-12-01: 2 [IU] via SUBCUTANEOUS

## 2020-11-28 MED ORDER — ATORVASTATIN CALCIUM 40 MG PO TABS
40.0000 mg | ORAL_TABLET | Freq: Every day | ORAL | Status: DC
  Administered 2020-11-29 – 2020-12-01 (×3): 40 mg via ORAL
  Filled 2020-11-28 (×3): qty 1

## 2020-11-28 MED ORDER — MEMANTINE HCL 5 MG PO TABS
5.0000 mg | ORAL_TABLET | Freq: Every day | ORAL | Status: DC
  Administered 2020-11-29 – 2020-12-01 (×3): 5 mg via ORAL
  Filled 2020-11-28 (×3): qty 1

## 2020-11-28 MED ORDER — ASPIRIN 81 MG PO CHEW
81.0000 mg | CHEWABLE_TABLET | Freq: Every day | ORAL | Status: DC
  Administered 2020-11-29 – 2020-12-01 (×3): 81 mg via ORAL
  Filled 2020-11-28 (×3): qty 1

## 2020-11-28 MED ORDER — AMLODIPINE BESYLATE 5 MG PO TABS
5.0000 mg | ORAL_TABLET | Freq: Every day | ORAL | Status: DC
  Administered 2020-11-29: 5 mg via ORAL
  Filled 2020-11-28: qty 1

## 2020-11-28 MED ORDER — POTASSIUM CHLORIDE 20 MEQ PO PACK
40.0000 meq | PACK | Freq: Once | ORAL | Status: AC
  Administered 2020-11-28: 40 meq via ORAL
  Filled 2020-11-28: qty 2

## 2020-11-28 MED ORDER — POTASSIUM CHLORIDE 10 MEQ/100ML IV SOLN
10.0000 meq | INTRAVENOUS | Status: AC
  Administered 2020-11-28 (×2): 10 meq via INTRAVENOUS
  Filled 2020-11-28: qty 100

## 2020-11-28 MED ORDER — INSULIN GLARGINE-YFGN 100 UNIT/ML ~~LOC~~ SOLN
12.0000 [IU] | Freq: Every day | SUBCUTANEOUS | Status: DC
  Administered 2020-11-28 – 2020-12-01 (×4): 12 [IU] via SUBCUTANEOUS
  Filled 2020-11-28 (×5): qty 0.12

## 2020-11-28 NOTE — TOC Initial Note (Signed)
Transition of Care [facility]) - Initial/Assessment Note    Patient Details  Name: Christal Lagerstrom MRN: [id number] Date of Birth: 05/12/1935  [facility] CM/SW Contact:    Joanne Chars, LCSW Phone Number: 11/28/2020, 2:54 PM  Clinical Narrative:  Pt unable to participate in assessment, CSW spoke with daughter [name] who confirms that pt has been in short term rehab at [facility].  Prior to that, pt lived with [place].  Discussed return to [facility], [name] has some concerns related to pt diet at the facility, discussed other options such as home with [facility] but [name] does want pt to return to [facility] prior to a return to her home.  Pt is vaccinated for covid but not boosted.  CSW spoke with [place] at [facility], shared [name]'s concerns.  Juliann Pulse will reach out to her to discuss.  [place] confirmed that pt can return, does have medicaid  as secondary insurance which would cover any copay days that are utilized.                   Expected Discharge Plan: Skilled Nursing Facility Barriers to Discharge: Continued Medical Work up, Other (must enter comment) (insurance auth)   Patient Goals and CMS Choice   CMS Medicare.gov Compare Post Acute Care list provided to::  (Pt will return to [facility])    Expected Discharge Plan and Services Expected Discharge Plan: [place] Choice: [place] arrangements for the past 2 months: Single Family Home                                      Prior Living Arrangements/Services Living arrangements for the past 2 months: Single Family Home Lives with:: Adult Children (lives with [place]) Patient language and need for interpreter reviewed:: No        Need for Family Participation in Patient Care: Yes (Comment) Care giver support system in place?: Yes (comment) Current home services: Other (comment) (na) Criminal Activity/Legal Involvement Pertinent to Current Situation/Hospitalization: No -  Comment as needed  Activities of Daily Living      Permission Sought/Granted                  Emotional Assessment Appearance:: Appears stated age Attitude/Demeanor/Rapport: Unable to Assess Affect (typically observed): Unable to Assess Orientation: : Oriented to Self Alcohol / Substance Use: Not Applicable Psych Involvement: No (comment)  Admission diagnosis:  DKA (diabetic ketoacidosis) ([place]) [E11.10] Diabetic ketoacidosis without coma associated with type 2 diabetes mellitus ([place]) [E11.10] Patient Active Problem List   Diagnosis Date Noted   DKA (diabetic ketoacidosis) ([place]) 11/26/2020   Cerebellar infarct ([place]) 11/06/2020   Dementia without behavioral disturbance ([place]) 11/06/2020   Type 2 diabetes mellitus with other specified complication ([place]) 22/29/7989   CKD (chronic kidney disease), stage III ([place]) 11/06/2020   Essential hypertension 07/12/2017   Orthostatic syncope 06/11/2017   Aortic ejection murmur 06/11/2017   Uncontrolled type 2 diabetes mellitus with nephropathy ([place]) 03/24/2014   Upper abdominal pain 03/23/2014   Nausea 03/23/2014   Abnormal findings on radiological examination of gastrointestinal tract 03/23/2014   Infected sebaceous cyst 04/15/2013   PCP:  Harlan Stains, MD Pharmacy:   [facility] ([facility]), [place] - [address]  ([place]) [place] [address] Phone: [phone] Fax: [phone]  [facility]  [facility]  Delivery) - [place], [place] [facility] [address] [place] [address] [place] [address] Phone: [phone] Fax: [phone]     Social Determinants of Health (SDOH) Interventions    Readmission Risk Interventions No flowsheet data found.

## 2020-11-28 NOTE — Progress Notes (Addendum)
PROGRESS NOTE        PATIENT DETAILS Name: Stacey Fuentes Age: 85 y.o. Sex: female Date of Birth: [birth year] Admit Date: 11/26/2020 Admitting Physician Oswald Hillock, MD YVO:PFYTW, Caren Griffins, MD  Brief Narrative: Patient is a 85 y.o. female with history of dementia, DM-2, recent CVA-presented to the hospital with altered mental status-found to have DKA and subsequently admitted to the hospitalist service.  Subjective: Was sleeping comfortably this morning when I saw the patient-she awoke-was interactive-answering simple questions appropriately.  RN and this MD fed this patient a whole container of applesauce-and she was easily able to gulp down ice cold water was well.  Objective: Vitals: Blood pressure (!) 165/95, pulse (!) 105, temperature 99 F (37.2 C), temperature source Axillary, resp. rate 18, height 5\' 2"  (1.575 m), weight 72.6 kg, SpO2 98 %.   Exam: Gen Exam: Much more awake and alert compared to yesterday. HEENT:atraumatic, normocephalic Chest: B/L clear to auscultation anteriorly CVS:S1S2 regular Abdomen:soft non tender, non distended Extremities:no edema Neurology: Appears nonfocal-moving all 4 extremities.   Skin: no rash   Pertinent Labs/Radiology: Na: 143 K: 3.0  creatinine: 1.13   10/17>>Blood culture: No growth 10/17>>Urine Culture: No growth  10/17>>CXR: No pneumonia 10/17>> CT head: No acute findings. 10/18>> MRI brain: No acute infarct-Cerebral atrophy with asymmetrical severe right temporal volume loss (similar findings in September 2022)  10/18>> EEG: No seizures  Assessment/Plan: DKA: Resolved-transition to SQ insulin today.    Acute metabolic encephalopathy superimposed on dementia: Much more awake and alert today compared to yesterday.  MRI brain without acute CVA-EEG without seizures.  Has dementia at baseline-so expect some amount of delirium this hospital stay.  Continue supportive care.  Hypernatremia: Resolved with  IVF.  Monitor electrolytes periodically.  AKI: Likely hemodynamically mediated/osmotic diuresis in the setting of DKA-resolved with supportive care.  Monitor electrolytes periodically.   HTN: BP on the higher side-resume amlodipine-if remains stable-we will plan to resume metoprolol over the next few days.    HLD: Resume statin  History of recent cerebellar vermis/left cerebral hemisphere September 2022: Continue aspirin (reviewed most recent DC summary-does not require Plavix any longer)-remains on statin.  MRI brain on 10/18 negative for CVA.    Dementia/delirium: Unclear whether she is back to her baseline as encephalopathy has improved significantly.  We will resume Namenda-and if mentation continues to improve-should be able to resume Zyprexa as well.  Maintain delirium precautions-she is at risk for delirium during this hospital stay.    Procedures: None Consults: None DVT Prophylaxis: Lovenox Code Status:Full code Family Communication: Daughter-[name] [phone] updated over the phone on 10/19  Time spent: 35 minutes-Greater than 50% of this time was spent in counseling, explanation of diagnosis, planning of further management, and coordination of care.  Diet: Diet Order             DIET DYS 2 Room service appropriate? Yes; Fluid consistency: Thin  Diet effective now                      Disposition Plan: Status is: Inpatient  Remains inpatient appropriate because: Needs inpatient level of treatment/IV medications.  Barriers to Discharge: Improving DKA-improving AKI-improving hypernatremia-still with severe encephalopathy.  Not yet stable for discharge.  Antimicrobial agents: Anti-infectives (From admission, onward)    None        MEDICATIONS: Scheduled Meds:  enoxaparin (LOVENOX) injection  40 mg Subcutaneous Q24H   insulin aspart  0-9 Units Subcutaneous TID WC   insulin glargine-yfgn  12 Units Subcutaneous Daily   pantoprazole (PROTONIX) IV   40 mg Intravenous Q24H   Continuous Infusions:   PRN Meds:.dextrose, haloperidol lactate, metoprolol tartrate   I have personally reviewed following labs and imaging studies  LABORATORY DATA: CBC: Recent Labs  Lab 11/26/20 1214 11/26/20 1441 11/26/20 1451  WBC 14.0*  --   --   NEUTROABS 12.4*  --   --   HGB 14.0 11.9* 11.9*  HCT 45.8 35.0* 35.0*  MCV 100.2*  --   --   PLT 134*  --   --      Basic Metabolic Panel: Recent Labs  Lab 11/27/20 0343 11/27/20 1425 11/27/20 1633 11/27/20 2201 11/28/20 0447  NA 151* 151* 149* 149* 143  K 4.2 3.9 3.7 3.4* 3.0*  CL 118* 118* 119* 117* 111  CO2 26 25 25 25 24   GLUCOSE 334* 178* 195* 225* 184*  BUN 41* 29* 28* 26* 24*  CREATININE 1.01* 0.88 0.90 1.01* 1.13*  CALCIUM 9.2 9.2 9.3 9.0 8.7*     GFR: Estimated Creatinine Clearance: 35.2 mL/min (A) (by C-G formula based on SCr of 1.13 mg/dL (H)).  Liver Function Tests: Recent Labs  Lab 11/26/20 1214  AST 16  ALT 20  ALKPHOS 65  BILITOT 1.0  PROT 7.2  ALBUMIN 3.7    No results for input(s): LIPASE, AMYLASE in the last 168 hours. No results for input(s): AMMONIA in the last 168 hours.  Coagulation Profile: Recent Labs  Lab 11/26/20 1229  INR 1.1     Cardiac Enzymes: No results for input(s): CKTOTAL, CKMB, CKMBINDEX, TROPONINI in the last 168 hours.  BNP (last 3 results) No results for input(s): PROBNP in the last 8760 hours.  Lipid Profile: No results for input(s): CHOL, HDL, LDLCALC, TRIG, CHOLHDL, LDLDIRECT in the last 72 hours.  Thyroid Function Tests: No results for input(s): TSH, T4TOTAL, FREET4, T3FREE, THYROIDAB in the last 72 hours.  Anemia Panel: No results for input(s): VITAMINB12, FOLATE, FERRITIN, TIBC, IRON, RETICCTPCT in the last 72 hours.  Urine analysis:    Component Value Date/Time   COLORURINE YELLOW 11/26/2020 [place] 11/26/2020 1349   LABSPEC 1.026 11/26/2020 1349   PHURINE 5.0 11/26/2020 1349   GLUCOSEU  >=500 (A) 11/26/2020 1349   HGBUR NEGATIVE 11/26/2020 [place] 11/26/2020 1349   KETONESUR 80 (A) 11/26/2020 1349   PROTEINUR NEGATIVE 11/26/2020 1349   NITRITE NEGATIVE 11/26/2020 1349   LEUKOCYTESUR NEGATIVE 11/26/2020 1349    Sepsis Labs: Lactic Acid, Venous    Component Value Date/Time   LATICACIDVEN 2.2 (HH) 11/26/2020 1420    MICROBIOLOGY: Recent Results (from the past 240 hour(s))  Resp Panel by RT-PCR (Flu A&B, Covid) Nasopharyngeal Swab     Status: None   Collection Time: 11/26/20 12:14 PM   Specimen: Nasopharyngeal Swab; Nasopharyngeal(NP) swabs in vial transport medium  Result Value Ref Range Status   SARS Coronavirus 2 by RT PCR NEGATIVE NEGATIVE Final    Comment: (NOTE) SARS-CoV-2 target nucleic acids are NOT DETECTED.  The SARS-CoV-2 RNA is generally detectable in upper respiratory specimens during the acute phase of infection. The lowest concentration of SARS-CoV-2 viral copies this assay can detect is 138 copies/mL. A negative result does not preclude SARS-Cov-2 infection and should not be used as the sole basis for treatment or other patient management decisions. A  negative result may occur with  improper specimen collection/handling, submission of specimen other than nasopharyngeal swab, presence of viral mutation(s) within the areas targeted by this assay, and inadequate number of viral copies(<138 copies/mL). A negative result must be combined with clinical observations, patient history, and epidemiological information. The expected result is Negative.  Fact Sheet for Patients:  EntrepreneurPulse.com.au  Fact Sheet for Healthcare Providers:  IncredibleEmployment.be  This test is no t yet approved or cleared by the [place] FDA and  has been authorized for detection and/or diagnosis of SARS-CoV-2 by FDA under an Emergency Use Authorization (EUA). This EUA will remain  in effect (meaning this  test can be used) for the duration of the COVID-19 declaration under Section 564(b)(1) of the Act, 21 U.S.C.section 360bbb-3(b)(1), unless the authorization is terminated  or revoked sooner.       Influenza A by PCR NEGATIVE NEGATIVE Final   Influenza B by PCR NEGATIVE NEGATIVE Final    Comment: (NOTE) The Xpert Xpress SARS-CoV-2/FLU/RSV plus assay is intended as an aid in the diagnosis of influenza from Nasopharyngeal swab specimens and should not be used as a sole basis for treatment. Nasal washings and aspirates are unacceptable for Xpert Xpress SARS-CoV-2/FLU/RSV testing.  Fact Sheet for Patients: EntrepreneurPulse.com.au  Fact Sheet for Healthcare Providers: IncredibleEmployment.be  This test is not yet approved or cleared by the [place] FDA and has been authorized for detection and/or diagnosis of SARS-CoV-2 by FDA under an Emergency Use Authorization (EUA). This EUA will remain in effect (meaning this test can be used) for the duration of the COVID-19 declaration under Section 564(b)(1) of the Act, 21 U.S.C. section 360bbb-3(b)(1), unless the authorization is terminated or revoked.  Performed at [facility], [place] [address], [place] [address]   Blood culture (routine single)     Status: None (Preliminary result)   Collection Time: 11/26/20 12:29 PM   Specimen: BLOOD RIGHT FOREARM  Result Value Ref Range Status   Specimen Description BLOOD RIGHT FOREARM  Final   Special Requests   Final    BOTTLES DRAWN AEROBIC AND ANAEROBIC Blood Culture adequate volume   Culture   Final    NO GROWTH 2 DAYS Performed at [facility], [place] [address] [place], [place] [address]    Report Status PENDING  Incomplete  Urine Culture     Status: None   Collection Time: 11/26/20  1:37 PM   Specimen: In/Out Cath Urine  Result Value Ref Range Status   Specimen Description IN/OUT CATH URINE  Final   Special Requests NONE   Final   Culture   Final    NO GROWTH Performed at [facility], [place] [address], [place], [place] [address]    Report Status 11/27/2020 FINAL  Final    RADIOLOGY STUDIES/RESULTS: CT HEAD WO CONTRAST (5MM)  Result Date: 11/26/2020 CLINICAL DATA:  Mental status changes of unknown cause. Hyperglycemia. EXAM: CT HEAD WITHOUT CONTRAST TECHNIQUE: Contiguous axial images were obtained from the base of the skull through the vertex without intravenous contrast. COMPARISON:  11/05/2020 FINDINGS: Brain: Generalized age related atrophy. There chronic small-vessel ischemic changes throughout the cerebral hemispheric white matter. Small acute/subacute cerebellar infarctions shown by recent MRI are not visible by CT. No mass, hemorrhage, hydrocephalus or extra-axial collection. Vascular: There is atherosclerotic calcification of the major vessels at the base of the brain. Skull: Negative Sinuses/Orbits: Clear/normal Other: None IMPRESSION: No acute finding by CT. Generalized atrophy. Extensive chronic small-vessel ischemic changes  of the cerebral hemispheric white matter. Electronically Signed   By: Nelson Chimes M.D.   On: 11/26/2020 13:48   MR BRAIN WO CONTRAST  Result Date: 11/27/2020 CLINICAL DATA:  Mental status change, unknown cause. EXAM: MRI HEAD WITHOUT CONTRAST TECHNIQUE: Multiplanar, multiecho pulse sequences of the brain and surrounding structures were obtained without intravenous contrast. COMPARISON:  Head CT 11/26/2020 and MRI 11/06/2020 FINDINGS: Brain: There is a small amount of residual signal abnormality at the site of the superior cerebellar infarcts on the prior MRI. No acute infarct, intracranial hemorrhage, mass, midline shift, or extra-axial fluid collection is identified. Patchy and confluent T2 hyperintensities in the cerebral white matter bilaterally are unchanged and nonspecific but compatible with moderate chronic small vessel ischemic disease. Mild cerebral and cerebellar atrophy  and asymmetrically severe right mesial temporal lobe volume loss are again noted. Vascular: Major intracranial vascular flow voids are preserved. Skull and upper cervical spine: Unremarkable bone marrow signal. Sinuses/Orbits: Bilateral cataract extraction. Paranasal sinuses and mastoid air cells are clear. Other: None. IMPRESSION: 1. No acute intracranial abnormality. 2. Moderate chronic small vessel ischemic disease. 3. Cerebral atrophy including asymmetrically severe right temporal lobe volume loss. Electronically Signed   By: Logan Bores M.D.   On: 11/27/2020 18:46   DG Chest Port 1 View  Result Date: 11/26/2020 CLINICAL DATA:  Altered mental status, hyperglycemia, possible sepsis. EXAM: PORTABLE CHEST 1 VIEW COMPARISON:  11/05/2020. FINDINGS: Patient is rotated. Trachea is midline. Heart size stable. Lungs are low in volume with minimal bibasilar subsegmental atelectasis. No pleural fluid. IMPRESSION: Low lung volumes with minimal left basilar atelectasis. Electronically Signed   By: Lorin Picket M.D.   On: 11/26/2020 13:40   EEG adult  Result Date: 11/27/2020 Lora Havens, MD     11/27/2020 11:00 AM Patient Name: Jaquilla Woodroof MRN: [id number] Epilepsy Attending: Lora Havens Referring Physician/Provider: Dr Oren Binet Date: 11/27/2020 Duration: 23.19 mins Patient history: 10 85 year old female with altered mental status.  EEG to evaluate for seizures. Level of alertness: lethargic AEDs during EEG study: None Technical aspects: This EEG study was done with scalp electrodes positioned according to the 10-20 International system of electrode placement. Electrical activity was acquired at a sampling rate of 500Hz  and reviewed with a high frequency filter of 70Hz  and a low frequency filter of 1Hz . EEG data were recorded continuously and digitally stored. Description: No clear posterior dominant rhythm was seen. EEG showed continuous generalized 3-5 hz theta- delta slowing, at times with  triphasic morphology.  Hyperventilation and photic stimulation were not performed.   ABNORMALITY - Continuous slow, generalized IMPRESSION: This study is suggestive of moderate diffuse encephalopathy, nonspecific etiology but could be secondary to toxic-metabolic causes. No seizures or epileptiform discharges were seen throughout the recording. Priyanka Barbra Sarks     LOS: 2 days   Oren Binet, MD  [facility]    To contact the attending provider between 7A-7P or the covering provider during after hours 7P-7A, please log into the web site [email or web address] and access using universal [place] password for that web site. If you do not have the password, please call the hospital operator.  11/28/2020, 11:48 AM

## 2020-11-28 NOTE — Evaluation (Signed)
Occupational Therapy Evaluation Patient Details Name: Stacey Fuentes MRN: [id number] DOB: January 30, 1936 Today's Date: 11/28/2020   History of Present Illness 85 y.o. female presents to [facility] hospital on 11/26/2020 with AMS, found to have DKA. PMH includes dementia, DM-2, recent CVA (September 2022).   Clinical Impression   Pt presents with above and limited by problem list below, including impaired cognition, decreased activity tolerance, generalized weakness, and impaired balance.  She has a history of dementia, follows commands intermittently but is lethargic and confused.  She reports being "off work" and looking to find diapers for her baby, knows her name but reports her birthday is in [birth year].  She requires total assist for all self care and total assist +2 for mobility to EOB today. Hypotension at EOB, but pt asymptomatic.  Pt will benefit from continued OT services while admitted and after dc at SNF level to decrease burden of care and progress back towards PLOF. Will follow.      Recommendations for follow up therapy are one component of a multi-disciplinary discharge planning process, led by the attending physician.  Recommendations may be updated based on patient status, additional functional criteria and insurance authorization.   Follow Up Recommendations  SNF    Equipment Recommendations  Other (comment) (TBD)    Recommendations for Other Services       Precautions / Restrictions Precautions Precautions: Fall Precaution Comments: dementia Restrictions Weight Bearing Restrictions: No      Mobility Bed Mobility Overal bed mobility: Needs Assistance Bed Mobility: Supine to Sit;Sit to Supine;Rolling Rolling: Total assist;+2 for physical assistance   Supine to sit: Total assist;+2 for physical assistance Sit to supine: Total assist;+2 for physical assistance        Transfers Overall transfer level:  (deferred 2/2 hypotension)                    Balance  Overall balance assessment: Needs assistance Sitting-balance support: Single extremity supported;Bilateral upper extremity supported;Feet supported Sitting balance-Leahy Scale: Fair Sitting balance - Comments: minG for static sitting at edge of bed                                   ADL either performed or assessed with clinical judgement   ADL Overall ADL's : Needs assistance/impaired     Grooming: Sitting;Wash/dry face;Total assistance                               Functional mobility during ADLs: +2 for physical assistance;Total assistance;+2 for safety/equipment       Vision   Additional Comments: pt with eyes closed during session, opens briefly but does not sustain     Perception     Praxis      Pertinent Vitals/Pain Pain Assessment: Faces Faces Pain Scale: Hurts a little bit Pain Location: generalized grimace Pain Descriptors / Indicators: Grimacing Pain Intervention(s): Monitored during session     Hand Dominance Right   Extremity/Trunk Assessment Upper Extremity Assessment Upper Extremity Assessment: Difficult to assess due to impaired cognition;RUE deficits/detail;LUE deficits/detail RUE Deficits / Details: able to hold UE up against gravity briefly, but unable to use functionally due to cognition LUE Deficits / Details: able to hold UE up against gravity briefly, squeeze hand but unable to use functionally due to cognition   Lower Extremity Assessment Lower Extremity Assessment: Defer to PT evaluation RLE Deficits /  Details: no initiation of LE movement noted this session, PROM WFL LLE Deficits / Details: no initiation of LE movement noted this session, PROM WFL   Cervical / Trunk Assessment Cervical / Trunk Assessment: Normal   Communication Communication Communication: HOH;Expressive difficulties   Cognition Arousal/Alertness: Awake/alert Behavior During Therapy: Flat affect Overall Cognitive Status: History of cognitive  impairments - at baseline                                 General Comments: dementia at baseline, seldomly following motor commands this session   General Comments  pt with hypotension upon sitting, 92/29. BP of 121/68 after return to supine. Pt denies symptoms of orthostatic BP however dementia and AMS may affect ability to accurately report symptomspt with hypotension upon sitting, 92/29. BP of 121/68 after return to supine. Pt denies symptoms of orthostatic BP however dementia and AMS may affect ability to accurately report symptoms    Exercises     Shoulder Instructions      Home Living Family/patient expects to be discharged to:: Unsure (no family present, pt discharged to SNF in September) Living Arrangements: Children Available Help at Discharge: Family;Available 24 hours/day Type of Home: House Home Access: Stairs to enter [employer] of Steps: 4-5 Entrance Stairs-Rails: Right;Left Home Layout: One level     Bathroom Shower/Tub: Teacher, early years/pre: Standard     Home Equipment: Cane - single point;Walker - 2 wheels;Shower seat          Prior Functioning/Environment Level of Independence: Needs assistance  Gait / Transfers Assistance Needed: Daughter reports occasional use of cane for ambulation ADL's / Homemaking Assistance Needed: Daughter reports she has to help pt initiate self feeding and brushing teeth.  She is able to bathe with supervision/set up.  She requires assist for dressing and tub transfers.  She is able to complete toileting mod I, but does have episodes of incontinence   Comments: history obtained from september admission, pt unable to report history due to AMS        OT Problem List: Decreased strength;Decreased range of motion;Decreased activity tolerance;Impaired balance (sitting and/or standing);Impaired vision/perception;Decreased coordination;Decreased cognition;Decreased safety awareness;Decreased  knowledge of use of DME or AE;Decreased knowledge of precautions;Cardiopulmonary status limiting activity;Pain;Impaired UE functional use      OT Treatment/Interventions: Self-care/ADL training;DME and/or AE instruction;Therapeutic activities;Cognitive remediation/compensation;Patient/family education;Balance training;Therapeutic exercise    OT Goals(Current goals can be found in the care plan section) Acute Rehab OT Goals Patient Stated Goal: pt unable to state, PT goal to improve functional mobility quality OT Goal Formulation: Patient unable to participate in goal setting Time For Goal Achievement: 12/12/20 Potential to Achieve Goals: Fair  OT Frequency: Min 2X/week   Barriers to D/C:            Co-evaluation PT/OT/SLP Co-Evaluation/Treatment: Yes Reason for Co-Treatment: Complexity of the patient's impairments (multi-system involvement);Necessary to address cognition/behavior during functional activity;For patient/therapist safety PT goals addressed during session: Mobility/safety with mobility;Balance;Strengthening/ROM OT goals addressed during session: ADL's and self-care      AM-PAC OT "6 Clicks" Daily Activity     Outcome Measure Help from another person eating meals?: Total Help from another person taking care of personal grooming?: Total Help from another person toileting, which includes using toliet, bedpan, or urinal?: Total Help from another person bathing (including washing, rinsing, drying)?: Total Help from another person to put on and taking off regular upper body  clothing?: Total Help from another person to put on and taking off regular lower body clothing?: Total 6 Click Score: 6   End of Session Nurse Communication: Mobility status;Precautions  Activity Tolerance: Patient limited by lethargy Patient left: in bed;with call bell/phone within reach;with bed alarm set  OT Visit Diagnosis: Other abnormalities of gait and mobility (R26.89);Muscle weakness  (generalized) (M62.81);Cognitive communication deficit (R41.841);Other symptoms and signs involving cognitive function                Time: 7939-6886 OT Time Calculation (min): 28 min Charges:  OT General Charges $OT Visit: 1 Visit OT Evaluation $OT Eval Moderate Complexity: 1 Mod  Jolaine Artist, OT Acute Rehabilitation Services Pager [phone] Office [phone]   Delight Stare 11/28/2020, 9:58 AM

## 2020-11-28 NOTE — Progress Notes (Signed)
CBG 71.  Endo tool recommended to hold insulin until 0230.  Nurse will recheck and hang D5LR.

## 2020-11-28 NOTE — Evaluation (Signed)
Clinical/Bedside Swallow Evaluation Patient Details  Name: Stacey Fuentes MRN: [id number] Date of Birth: 04-Jun-1935  Today's Date: 11/28/2020 Time: SLP Start Time (ACUTE ONLY): 1025 SLP Stop Time (ACUTE ONLY): 1055 SLP Time Calculation (min) (ACUTE ONLY): 30 min  Past Medical History:  Past Medical History:  Diagnosis Date   Benign essential tremor    Cataract    CKD (chronic kidney disease), stage II    Diabetes mellitus ([place])    type2   Full dentures    Hyperlipidemia    Memory difficulty    Osteoporosis    Uncontrolled diabetes mellitus type 2 without complications 3/81/0175   Vitamin D deficiency    Wears glasses    Past Surgical History:  Past Surgical History:  Procedure Laterality Date   APPENDECTOMY     childhood   CATARACT EXTRACTION, BILATERAL  2014   COLONOSCOPY     in [place] in [place] N/A 05/18/2013   Procedure: EXCISION OF SEBACEOUS CYST BACK;  Surgeon: Merrie Roof, MD;  Location: [facility];  Service: General;  Laterality: N/A;   surgery to remove cyst on back  2010   x 2   TRANSTHORACIC ECHOCARDIOGRAM  06/17/2017    EF 60-65%.  Normal wall motion.  GR 1 DD.  No obvious valvular lesions.   TUBAL LIGATION     HPI:  Pt is an 85 y/o female admitted 10/18 from SNF with secondary to AMS, found to have DKA. Imaging showed infarct in cerebellar vermis and L cerebellar hemisphere. PMH includes dementia, HTN, CKD, DM, osteoporosis, and tremors.  Imaging did not show acute neurological change - MRI and CT.  CXR showed low lung volumes, nothing acute. .  Her diet at SNF is level 7/regular/thin per soft chart from facility.  Swallow evaluation ordered.    Assessment / Plan / Recommendation  Clinical Impression  Pt initially lethargic but did awaken to answer SLP questions re: her needs/wants.  She demonsrates oral retention of secretions without awareness and did swallow once during entire session *at beginning*- suspect  may be reflexive due to spillage of secretions into pharynx.  Total multimodal cues were not effective to elicit swallow- effort *dry spoon, cold stimulation, etc.   Pt answered questions with sentence length information x1 - including that she "used to use upper denture but not anymore".  SLP presented ice chips, water and taste of juice to pt.  She opened mouth to accept boluses but is not eliciting a swallow - allowing anterior spillage or holding.  Pt even required total cues=physical assist to allow spillage of oral retention into cup.   SLP suspects pt's prior CVA, dementia with exacerbation from current mentation as source of dysphagia.  At this time, recommend NPO and oral care/oral suction of secretions.  Suspect as she recovers from her DKA, her dysphagia will improve, especially as she was on a regular/thin diet at SNF.  INformed RN via secure chat.  IV in right hand bleeding, set hand on pillow and informed RN via secure chat. SLP Visit Diagnosis: Dysphagia, oral phase (R13.11)    Aspiration Risk  Severe aspiration risk;Risk for inadequate nutrition/hydration    Diet Recommendation NPO   Medication Administration: Via alternative means    Other  Recommendations      Recommendations for follow up therapy are one component of a multi-disciplinary discharge planning process, led by the attending physician.  Recommendations may be updated based on patient status, additional functional  criteria and insurance authorization.  Follow up Recommendations    SNF    Frequency and Duration min 2x/week  2 weeks       Prognosis Prognosis for Safe Diet Advancement: Good      Swallow Study   General HPI: Pt is an 85 y/o female admitted 10/18 from SNF with secondary to AMS, found to have DKA. Imaging showed infarct in cerebellar vermis and L cerebellar hemisphere. PMH includes dementia, HTN, CKD, DM, osteoporosis, and tremors.  Imaging did not show acute neurological change - MRI and CT.  CXR  showed low lung volumes, nothing acute. .  Her diet at SNF is level 7/regular/thin per soft chart from facility.  Swallow evaluation ordered. Type of Study: Bedside Swallow Evaluation Previous Swallow Assessment: pt passed [place] during prior admit Diet Prior to this Study: Dysphagia 2 (chopped);Thin liquids Temperature Spikes Noted: No Respiratory Status: Room air History of Recent Intubation: No Behavior/Cognition: Lethargic/Drowsy (but did awaken to answer questions and follow some directions) Oral Cavity Assessment: Excessive secretions;Other (comment) (oral holding of secretions with drooling) Oral Care Completed by SLP: Other (Comment) (attempted, pt resistant to oral care - but did allow toothette cleaning) Oral Cavity - Dentition: Other (Comment) (lower anterior dentition, no uppers and no denture present) Self-Feeding Abilities: Total assist Patient Positioning: Upright in bed Baseline Vocal Quality: Low vocal intensity Volitional Cough: Cognitively unable to elicit (reflexive cough is weak) Volitional Swallow: Unable to elicit    Oral/Motor/Sensory Function Overall Oral Motor/Sensory Function: Generalized oral weakness (bilateral anterior loss of secretions, melted ice and water)   Ice Chips Ice chips: Impaired Presentation: Spoon Oral Phase Impairments: Reduced labial seal;Reduced lingual movement/coordination;Poor awareness of bolus Oral Phase Functional Implications: Right anterior spillage;Oral holding   Thin Liquid Thin Liquid: Impaired Presentation: Straw;Spoon Oral Phase Impairments: Poor awareness of bolus Oral Phase Functional Implications: Right anterior spillage;Left anterior spillage;Oral holding    Nectar Thick Nectar Thick Liquid: Not tested   Honey Thick Honey Thick Liquid: Not tested   Puree Puree: Not tested   Solid     Solid: Not tested     Kathleen Lime, [facility] SLP Acute Rehab Services Office [phone] Pager [phone]  Macario Golds 11/28/2020,11:12 AM

## 2020-11-28 NOTE — Evaluation (Signed)
Physical Therapy Evaluation Patient Details Name: Stacey Fuentes MRN: [id number] DOB: November 27, 1935 Today's Date: 11/28/2020  History of Present Illness  85 y.o. female presents to [facility] hospital on 11/26/2020 with AMS, found to have DKA. PMH includes dementia, DM-2, recent CVA (September 2022).  Clinical Impression  Pt presents to PT with deficits in cognition, balance, power, gait, functional mobility, endurance, strength. Pt with history of dementia and currently altered, unable to accurately report history at this time. Pt reports she is "off work" and needs to find diapers for her baby. Pt does not initiate any functional mobility this session and requires significant physical assistance at this time. PT defers attempts at standing due to hypotension. Pt will benefit from continued attempts at mobilization to reduce falls risk and caregiver burden. PT recommends SNF placement in an effort to improve functional mobility quality.        Recommendations for follow up therapy are one component of a multi-disciplinary discharge planning process, led by the attending physician.  Recommendations may be updated based on patient status, additional functional criteria and insurance authorization.  Follow Up Recommendations SNF    Equipment Recommendations  Wheelchair (measurements PT);Wheelchair cushion (measurements PT);Hospital bed;Other (comment) (hoyer lift)    Recommendations for Other Services       Precautions / Restrictions Precautions Precautions: Fall Precaution Comments: dementia Restrictions Weight Bearing Restrictions: No      Mobility  Bed Mobility Overal bed mobility: Needs Assistance Bed Mobility: Supine to Sit;Sit to Supine;Rolling Rolling: Total assist;+2 for physical assistance   Supine to sit: Total assist;+2 for physical assistance Sit to supine: Total assist;+2 for physical assistance        Transfers Overall transfer level:  (deferred 2/2 hypotension)                   Ambulation/Gait                Stairs            Wheelchair Mobility    Modified Rankin (Stroke Patients Only)       Balance Overall balance assessment: Needs assistance Sitting-balance support: Single extremity supported;Bilateral upper extremity supported;Feet supported Sitting balance-Leahy Scale: Fair Sitting balance - Comments: minG for static sitting at edge of bed                                     Pertinent Vitals/Pain Pain Assessment: Faces Faces Pain Scale: Hurts a little bit Pain Location: generalized grimace Pain Descriptors / Indicators: Grimacing Pain Intervention(s): Monitored during session    Home Living Family/patient expects to be discharged to:: Unsure (no family present, pt discharged to SNF in September) Living Arrangements: Children Available Help at Discharge: Family;Available 24 hours/day Type of Home: House Home Access: Stairs to enter Entrance Stairs-Rails: Psychiatric nurse of Steps: 4-5 Home Layout: One level Home Equipment: Cane - single point;Walker - 2 wheels;Shower seat      Prior Function Level of Independence: Needs assistance   Gait / Transfers Assistance Needed: Daughter reports occasional use of cane for ambulation  ADL's / Homemaking Assistance Needed: Daughter reports she has to help pt initiate self feeding and brushing teeth.  She is able to bathe with supervision/set up.  She requires assist for dressing and tub transfers.  She is able to complete toileting mod I, but does have episodes of incontinence  Comments: history obtained from september admission, pt unable to  report history due to AMS     Hand Dominance   Dominant Hand: Right    Extremity/Trunk Assessment   Upper Extremity Assessment Upper Extremity Assessment: Defer to OT evaluation    Lower Extremity Assessment Lower Extremity Assessment: RLE deficits/detail;LLE deficits/detail RLE Deficits /  Details: no initiation of LE movement noted this session, PROM WFL LLE Deficits / Details: no initiation of LE movement noted this session, PROM WFL    Cervical / Trunk Assessment Cervical / Trunk Assessment: Normal  Communication   Communication: HOH;Expressive difficulties  Cognition Arousal/Alertness: Awake/alert Behavior During Therapy: Flat affect Overall Cognitive Status: History of cognitive impairments - at baseline                                 General Comments: dementia at baseline, seldomly following motor commands this session      General Comments General comments (skin integrity, edema, etc.): pt with hypotension upon sitting, 92/29. BP of 121/68 after return to supine. Pt denies symptoms of orthostatic BP however dementia and AMS may affect ability to accurately report symptoms    Exercises     Assessment/Plan    PT Assessment Patient needs continued PT services  PT Problem List Decreased strength;Decreased activity tolerance;Decreased balance;Decreased mobility;Decreased cognition;Decreased knowledge of use of DME;Decreased safety awareness;Decreased knowledge of precautions;Cardiopulmonary status limiting activity       PT Treatment Interventions DME instruction;Functional mobility training;Gait training;Therapeutic activities;Therapeutic exercise;Balance training;Neuromuscular re-education;Cognitive remediation;Patient/family education;Wheelchair mobility training    PT Goals (Current goals can be found in the Care Plan section)  Acute Rehab PT Goals Patient Stated Goal: pt unable to state, PT goal to improve functional mobility quality PT Goal Formulation: Patient unable to participate in goal setting Time For Goal Achievement: 12/12/20 Potential to Achieve Goals: Fair    Frequency Min 2X/week   Barriers to discharge        Co-evaluation PT/OT/SLP Co-Evaluation/Treatment: Yes Reason for Co-Treatment: Complexity of the patient's  impairments (multi-system involvement);Necessary to address cognition/behavior during functional activity;For patient/therapist safety PT goals addressed during session: Mobility/safety with mobility;Balance;Strengthening/ROM         AM-PAC PT "6 Clicks" Mobility  Outcome Measure Help needed turning from your back to your side while in a flat bed without using bedrails?: Total Help needed moving from lying on your back to sitting on the side of a flat bed without using bedrails?: Total Help needed moving to and from a bed to a chair (including a wheelchair)?: Total Help needed standing up from a chair using your arms (e.g., wheelchair or bedside chair)?: Total Help needed to walk in hospital room?: Total Help needed climbing 3-5 steps with a railing? : Total 6 Click Score: 6    End of Session   Activity Tolerance: Treatment limited secondary to medical complications (Comment) (hypotension) Patient left: in bed;with call bell/phone within reach;with bed alarm set;Other (comment) (reapplied mits) Nurse Communication: Mobility status;Need for lift equipment PT Visit Diagnosis: Other abnormalities of gait and mobility (R26.89);Muscle weakness (generalized) (M62.81)    Time: 4656-8127 PT Time Calculation (min) (ACUTE ONLY): 23 min   Charges:   PT Evaluation $PT Eval Low Complexity: [place], PT, DPT Acute Rehabilitation Pager: [phone] Office [phone]   Zenaida Niece 11/28/2020, 9:39 AM

## 2020-11-28 NOTE — NC FL2 (Signed)
[place] LEVEL OF CARE SCREENING TOOL     IDENTIFICATION  Patient Name: Stacey Fuentes Birthdate: [birth year] Sex: female Admission Date (Current Location): 11/26/2020  [place] and [place] Number:  Kathleen Argue [id number] [place] and Address:  [facility] [place], [place] [address], [place],  [address]      Provider Number: [id number]  Attending Physician Name and Address:  Jonetta Osgood, MD  Relative Name and Phone Number:  [name] Daughter [phone]    Current Level of Care: Hospital Recommended Level of Care: [place] Prior Approval Number:    Date Approved/Denied:   PASRR Number: [id number]  Discharge Plan: SNF    Current Diagnoses: Patient Active Problem List   Diagnosis Date Noted   DKA (diabetic ketoacidosis) ([place]) 11/26/2020   Cerebellar infarct ([place]) 11/06/2020   Dementia without behavioral disturbance ([place]) 11/06/2020   Type 2 diabetes mellitus with other specified complication ([place]) 27/07/2374   CKD (chronic kidney disease), stage III ([place]) 11/06/2020   Essential hypertension 07/12/2017   Orthostatic syncope 06/11/2017   Aortic ejection murmur 06/11/2017   Uncontrolled type 2 diabetes mellitus with nephropathy ([place]) 03/24/2014   Upper abdominal pain 03/23/2014   Nausea 03/23/2014   Abnormal findings on radiological examination of gastrointestinal tract 03/23/2014   Infected sebaceous cyst 04/15/2013    Orientation RESPIRATION BLADDER Height & Weight     Self  Normal External catheter Weight: 160 lb (72.6 kg) Height:  5\' 2"  (157.5 cm)  BEHAVIORAL SYMPTOMS/MOOD NEUROLOGICAL BOWEL NUTRITION STATUS      Continent Diet  AMBULATORY STATUS COMMUNICATION OF NEEDS Skin   Total Care Verbally Normal                       Personal Care Assistance Level of Assistance  Bathing, Feeding, Dressing, Total care Bathing Assistance: Maximum assistance Feeding assistance: Maximum  assistance Dressing Assistance: Maximum assistance Total Care Assistance: Maximum assistance   Functional Limitations Info    Sight Info: Impaired Hearing Info: Adequate Speech Info: Adequate    SPECIAL CARE FACTORS FREQUENCY  PT (By licensed PT), OT (By licensed OT)     PT Frequency: 5x week OT Frequency: 5x week            Contractures Contractures Info: Not present    Additional Factors Info  Code Status, Allergies Code Status Info: full Allergies Info: Metformin And Related, Saxagliptin   Insulin Sliding Scale Info: Novolog, 0-9 units, 3x day with meals.  See discharge summary       Current Medications (11/28/2020):  This is the current hospital active medication list Current Facility-Administered Medications  Medication Dose Route Frequency Provider Last Rate Last Admin   amLODipine (NORVASC) tablet 5 mg  5 mg Oral Daily Ghimire, Henreitta Leber, MD       aspirin chewable tablet 81 mg  81 mg Oral Daily Ghimire, Shanker M, MD       atorvastatin (LIPITOR) tablet 40 mg  40 mg Oral Daily Ghimire, Shanker M, MD       dextrose 50 % solution 0-50 mL  0-50 mL Intravenous PRN Oswald Hillock, MD       enoxaparin (LOVENOX) injection 40 mg  40 mg Subcutaneous Q24H Ghimire, Shanker M, MD   40 mg at 11/28/20 1323   haloperidol lactate (HALDOL) injection 2 mg  2 mg Intravenous Q6H PRN Ghimire, Henreitta Leber, MD       insulin aspart (novoLOG) injection 0-9 Units  0-9 Units Subcutaneous  TID WC Ghimire, Henreitta Leber, MD       insulin glargine-yfgn [facility]) injection 12 Units  12 Units Subcutaneous Daily Jonetta Osgood, MD   12 Units at 11/28/20 0944   memantine (NAMENDA) tablet 5 mg  5 mg Oral Daily Ghimire, Henreitta Leber, MD       pantoprazole (PROTONIX) injection 40 mg  40 mg Intravenous Q24H Jonetta Osgood, MD   40 mg at 11/28/20 1328     Discharge Medications: Please see discharge summary for a list of discharge medications.  Relevant Imaging Results:  Relevant Lab  Results:   Additional Information SS#: [id number]. Pt is vaccinated for covid but not boosted.  Joanne Chars, LCSW

## 2020-11-28 NOTE — Progress Notes (Signed)
Endo tool 2119 increased 5.5, 2335 5.5, 0035 5.5.  Pt Cbg was stable at 0608 Md will recesses in the am.

## 2020-11-28 NOTE — Progress Notes (Signed)
Speech Language Pathology Treatment: Dysphagia  Patient Details Name: Stacey Fuentes MRN: [id number] DOB: 1935-04-11 Today's Date: 11/28/2020 Time: 0932-3557 SLP Time Calculation (min) (ACUTE ONLY): 25 min  Assessment / Plan / Recommendation Clinical Impression  Pt awakened by SLP who washed face and repositioned. Pt with poorly focused attention, needed max tactile cues to take sips of thin liquids, initially dripping from a straw into pts mouth, then pt sipping from straw or accepting sips from cup edge. No oral holding or aspiration with liquids. Pt accepted spoons of puree, was more attentive on right. Pt with prolonged oral holding in 50% of trials of puree from lunch tray. Sips of liquids facilitated oral transit. It is appropriate to continue to attempt oral feeding as mentation improves, though intermittent oral holding is probable. If oral suction is present, held boluses can be removed easily. Posted sign and discussed with RN. Will f/u   HPI HPI: Pt is an 85 y/o female admitted 10/18 from SNF with secondary to AMS, found to have DKA. Imaging showed infarct in cerebellar vermis and L cerebellar hemisphere. PMH includes dementia, HTN, CKD, DM, osteoporosis, and tremors.  Imaging did not show acute neurological change - MRI and CT.  CXR showed low lung volumes, nothing acute. .  Her diet at SNF is level 7/regular/thin per soft chart from facility.  Swallow evaluation ordered.      SLP Plan  Continue with current plan of care      Recommendations for follow up therapy are one component of a multi-disciplinary discharge planning process, led by the attending physician.  Recommendations may be updated based on patient status, additional functional criteria and insurance authorization.    Recommendations  Diet recommendations: Dysphagia 1 (puree);Thin liquid Liquids provided via: Straw;Cup Medication Administration: Crushed with puree Supervision: Full supervision/cueing for compensatory  strategies Compensations: Slow rate;Small sips/bites;Minimize environmental distractions                Oral Care Recommendations: Oral care BID Follow up Recommendations: 24 hour supervision/assistance SLP Visit Diagnosis: Dysphagia, oral phase (R13.11) Plan: Continue with current plan of care       Stacey Makilah Dowda, MA [place] Office [phone]  Lynann Beaver  11/28/2020, 2:39 PM

## 2020-11-29 LAB — BASIC METABOLIC PANEL
Anion gap: 10 (ref 5–15)
BUN: 23 mg/dL (ref 8–23)
CO2: 24 mmol/L (ref 22–32)
Calcium: 8.7 mg/dL — ABNORMAL LOW (ref 8.9–10.3)
Chloride: 114 mmol/L — ABNORMAL HIGH (ref 98–111)
Creatinine, Ser: 1.23 mg/dL — ABNORMAL HIGH (ref 0.44–1.00)
GFR, Estimated: 44 mL/min — ABNORMAL LOW (ref >60)
Glucose, Bld: 222 mg/dL — ABNORMAL HIGH (ref 70–99)
Potassium: 3.6 mmol/L (ref 3.5–5.1)
Sodium: 148 mmol/L — ABNORMAL HIGH (ref 135–145)

## 2020-11-29 LAB — GLUCOSE, CAPILLARY
Glucose-Capillary: 177 mg/dL — ABNORMAL HIGH (ref 70–99)
Glucose-Capillary: 238 mg/dL — ABNORMAL HIGH (ref 70–99)
Glucose-Capillary: 172 mg/dL — ABNORMAL HIGH (ref 70–99)
Glucose-Capillary: 109 mg/dL — ABNORMAL HIGH (ref 70–99)
Glucose-Capillary: 191 mg/dL — ABNORMAL HIGH (ref 70–99)

## 2020-11-29 LAB — MAGNESIUM: Magnesium: 1.6 mg/dL — ABNORMAL LOW (ref 1.7–2.4)

## 2020-11-29 MED ORDER — AMLODIPINE BESYLATE 5 MG PO TABS
2.5000 mg | ORAL_TABLET | Freq: Every day | ORAL | Status: DC
  Administered 2020-11-30 – 2020-12-01 (×2): 2.5 mg via ORAL
  Filled 2020-11-29 (×2): qty 1

## 2020-11-29 MED ORDER — SODIUM CHLORIDE 0.45 % IV SOLN: INTRAVENOUS | Status: DC

## 2020-11-29 MED ORDER — MAGNESIUM SULFATE 2 GM/50ML IV SOLN
2.0000 g | Freq: Once | INTRAVENOUS | Status: AC
  Administered 2020-11-29: 2 g via INTRAVENOUS
  Filled 2020-11-29: qty 50

## 2020-11-29 NOTE — Progress Notes (Signed)
Physical Therapy Treatment Patient Details Name: Stacey Fuentes MRN: [id number] DOB: 1935-11-04 Today's Date: 11/29/2020   History of Present Illness 85 y.o. female presents to [facility] hospital on 11/26/2020 with AMS, found to have DKA. PMH includes dementia, DM-2, recent CVA (September 2022).    PT Comments    Pt making progress with mobility. With pt's dementia feel she will progress with mobility with consistent reinforcement and assist. Feel she could benefit from SNF to regain her mobility.    Recommendations for follow up therapy are one component of a multi-disciplinary discharge planning process, led by the attending physician.  Recommendations may be updated based on patient status, additional functional criteria and insurance authorization.  Follow Up Recommendations  SNF     Equipment Recommendations  Wheelchair (measurements PT);Wheelchair cushion (measurements PT);Hospital bed    Recommendations for Other Services       Precautions / Restrictions Precautions Precautions: Fall Precaution Comments: dementia     Mobility  Bed Mobility Overal bed mobility: Needs Assistance Bed Mobility: Supine to Sit;Sit to Supine;Rolling Rolling: Mod assist   Supine to sit: +2 for physical assistance;Total assist Sit to supine: +2 for physical assistance;Mod assist   General bed mobility comments: Assist for all aspects of supine to sit due to pt asleep/lethargic. More alert after sitting EOB and standing and then +2 mod assist to return to supine with assist to initiate and control descent of trunk and assist to initiate and bring legs up into bed. Pt rolled with mod assist primarily to initiate movment.    Transfers Overall transfer level: Needs assistance Equipment used: Rolling walker (2 wheeled);2 person hand held assist Transfers: Sit to/from Stand Sit to Stand: +2 physical assistance;Mod assist         General transfer comment: Assist to bring hips up and for balance. On  initial 2 stands used bed pad under hips and rolling walker. On last stand used bilateral HHA and did not need pad under her hips.  Ambulation/Gait             General Gait Details: Stood but unable to take any steps   Marine scientist [place] (Stroke Patients Only)       Balance Overall balance assessment: Needs assistance Sitting-balance support: No upper extremity supported;Feet supported Sitting balance-Leahy Scale: Fair     Standing balance support: Bilateral upper extremity supported Standing balance-Leahy Scale: Poor Standing balance comment: Stood x 3 for 30-45 seconds with +1-+2 mod assist. Pt with posterior lean                            Cognition Arousal/Alertness: Awake/alert;Lethargic Behavior During Therapy: Flat affect Overall Cognitive Status: History of cognitive impairments - at baseline                                 General Comments: dementia a baseline. Only said "mmhuh" or laughed in response to questions/statements. With multimodal cuing pt followed some gross mobility commands (stand up, lie down). Pt initially lethargic. After getting pt to EOB she aroused and was alert for remainder of session.      Exercises      General Comments General comments (skin integrity, edema, etc.): VSS on RA      Pertinent Vitals/Pain Pain Assessment: Faces Faces Pain Scale: No  hurt    Home Living                      Prior Function            PT Goals (current goals can now be found in the care plan section) Progress towards PT goals: Progressing toward goals    Frequency    Min 2X/week      PT Plan      Co-evaluation              AM-PAC PT "6 Clicks" Mobility   Outcome Measure  Help needed turning from your back to your side while in a flat bed without using bedrails?: A Lot Help needed moving from lying on your back to sitting on the side of a flat bed  without using bedrails?: Total Help needed moving to and from a bed to a chair (including a wheelchair)?: Total Help needed standing up from a chair using your arms (e.g., wheelchair or bedside chair)?: Total Help needed to walk in hospital room?: Total Help needed climbing 3-5 steps with a railing? : Total 6 Click Score: 7    End of Session Equipment Utilized During Treatment: Gait belt Activity Tolerance: Patient tolerated treatment well Patient left: in bed;with call bell/phone within reach;with bed alarm set   PT Visit Diagnosis: Other abnormalities of gait and mobility (R26.89);Muscle weakness (generalized) (M62.81)     Time: 7972-8206 PT Time Calculation (min) (ACUTE ONLY): 21 min  Charges:  $Therapeutic Activity: 8-22 mins                     [place] Pager [phone] Office [place] 11/29/2020, 2:19 PM

## 2020-11-29 NOTE — Progress Notes (Signed)
Speech Language Pathology Treatment: Dysphagia  Patient Details Name: Stacey Fuentes MRN: [id number] DOB: 1935/02/19 Today's Date: 11/29/2020 Time: 1000-1015 SLP Time Calculation (min) (ACUTE ONLY): 15 min  Assessment / Plan / Recommendation Clinical Impression  Pt demonstrates significant lethargy today upon arrival of her breakfast. SLP and Rn set up oral suction. Repositioned pt, washed face. Pt seemed to have some residue of pocketed meds on her face. Pt intermittently opened eyes, but did not respond to hand over hand, tactile cues to feeding. Did not orally manipulate taste of juice. Pts mouth suctioned. Per MD arousal waxes and wanes this am. Pt may continue a diet if alert and oral suction present.   HPI HPI: Pt is an 85 y/o female admitted 10/18 from SNF with secondary to AMS, found to have DKA. Imaging showed infarct in cerebellar vermis and L cerebellar hemisphere. PMH includes dementia, HTN, CKD, DM, osteoporosis, and tremors.  Imaging did not show acute neurological change - MRI and CT.  CXR showed low lung volumes, nothing acute. .  Her diet at SNF is level 7/regular/thin per soft chart from facility.  Swallow evaluation ordered.      SLP Plan  Continue with current plan of care      Recommendations for follow up therapy are one component of a multi-disciplinary discharge planning process, led by the attending physician.  Recommendations may be updated based on patient status, additional functional criteria and insurance authorization.    Recommendations  Diet recommendations: Thin liquid;Dysphagia 1 (puree) Liquids provided via: Straw;Cup Medication Administration: Crushed with puree Supervision: Full supervision/cueing for compensatory strategies Compensations: Slow rate;Small sips/bites;Minimize environmental distractions Postural Changes and/or Swallow Maneuvers: Seated upright 90 degrees                Follow up Recommendations: 24 hour supervision/assistance SLP  Visit Diagnosis: Dysphagia, oral phase (R13.11) Plan: Continue with current plan of care       GO                Marijo Quizon, Katherene Ponto  11/29/2020, 12:49 PM

## 2020-11-29 NOTE — Progress Notes (Signed)
Inpatient Diabetes Program Recommendations  AACE/ADA: New Consensus Statement on Inpatient Glycemic Control (2015)  Target Ranges:  Prepandial:   less than 140 mg/dL      Peak postprandial:   less than 180 mg/dL (1-2 hours)      Critically ill patients:  140 - 180 mg/dL   Lab Results  Component Value Date   GLUCAP 238 (H) 11/29/2020   HGBA1C 8.3 (H) 11/07/2020    Review of Glycemic Control Results for Stacey Fuentes, Stacey Fuentes (MRN [id number]) as of 11/29/2020 11:51  Ref. Range 11/28/2020 11:24 11/28/2020 16:02 11/28/2020 16:43 11/28/2020 21:59 11/29/2020 09:02  Glucose-Capillary Latest Ref Range: 70 - 99 mg/dL 161 (H) 236 (H) 206 (H) 186 (H) 238 (H)  Diabetes history: DM 2 Outpatient Diabetes medications:  Novolog 0-10 units tid with meals, Semglee 15 units q HS Current orders for Inpatient glycemic control:  Semglee 12 units daily, Novolog sensitive tid with meals Inpatient Diabetes Program Recommendations:     Consider increasing Semglee to 15 units daily.    Thanks,  Adah Perl, RN, BC-ADM Inpatient Diabetes Coordinator Pager [phone]  (8a-5p)

## 2020-11-29 NOTE — Progress Notes (Signed)
PROGRESS NOTE        PATIENT DETAILS Name: Stacey Fuentes Age: 85 y.o. Sex: female Date of Birth: 1935-12-07 Admit Date: 11/26/2020 Admitting Physician Oswald Hillock, MD VOZ:DGUYQ, Caren Griffins, MD  Brief Narrative: Patient is a 85 y.o. female with history of dementia, DM-2, recent CVA-presented to the hospital with altered mental status-found to have DKA and subsequently admitted to the hospitalist service.  Subjective: Seen earlier this morning-she was easily arousable-and answering simple questions appropriately.    Objective: Vitals: Blood pressure (!) 102/47, pulse 86, temperature 98.2 F (36.8 C), temperature source Axillary, resp. rate 15, height 5\' 2"  (1.575 m), weight 72.6 kg, SpO2 98 %.   Exam: Gen Exam: BP but arousable-chronically frail.  Not in any distress. HEENT:atraumatic, normocephalic Chest: B/L clear to auscultation anteriorly CVS:S1S2 regular Abdomen:soft non tender, non distended Extremities:no edema Neurology: Non focal-but with generalized weakness. Skin: no rash   Pertinent Labs/Radiology: Na: 148 K: 3.6 creatinine: 1.23  10/17>>Blood culture: No growth 10/17>>Urine Culture: No growth  10/17>>CXR: No pneumonia 10/17>> CT head: No acute findings. 10/18>> MRI brain: No acute infarct-Cerebral atrophy with asymmetrical severe right temporal volume loss (similar findings in September 2022)  10/18>> EEG: No seizures  Assessment/Plan: DKA: Resolved-has been transitioned to SQ insulin.  DM-2 (A1c 8.3 on 9/28): CBGs relatively stable-allow permissive hyperglycemia-as patient with frailty/dementia.  Continue Lantus 12 units daily and SSI.  Recent Labs    11/28/20 2159 11/29/20 0902 11/29/20 1219  GLUCAP 186* 238* 172*     Acute metabolic encephalopathy superimposed on dementia: Overall improving-mental status continues to fluctuate quite a bit.  When I saw her earlier this morning-she was relatively awake and alert-but later  on when evaluated by speech therapy-she was hard to arouse.  MRI brain negative for acute CVA-Spot EEG without seizures.  Continue supportive care-she has dementia at baseline-suspect she will continue to have fluctuating mental status during this hospitalization.   Hypernatremia: Continues to have mild hyponatremia-we will start half-normal and reassess on 10/21.  AKI: Likely hemodynamically mediated-improving-continue gentle hydration for today.  Recheck electrolytes tomorrow.  HTN: BP stable-low 034V systolic-we will decrease amlodipine to 2.5 mg daily.  Reassess on 10/21.   HLD: Continue statin.  History of recent cerebellar vermis/left cerebral hemisphere September 2022: Continue aspirin (reviewed most recent DC summary-does not require Plavix any longer)-remains on statin.  MRI brain on 10/18 negative for CVA.    Dementia/delirium: Unclear whether she is back to her baseline as encephalopathy has improved significantly.  Continue Namenda-if mental status improves-we will resume Zyprexa over the next few days.  Continue to maintain delirium precautions-at risk for delirium during this hospital stay.     Procedures: None Consults: None DVT Prophylaxis: Lovenox Code Status:Full code Family Communication: Daughter-[name] [phone] updated over the phone on 10/19  Time spent: 35 minutes-Greater than 50% of this time was spent in counseling, explanation of diagnosis, planning of further management, and coordination of care.  Diet: Diet Order             DIET DYS 2 Room service appropriate? Yes; Fluid consistency: Thin  Diet effective now                      Disposition Plan: Status is: Inpatient  Remains inpatient appropriate because: Needs inpatient level of treatment/IV medications.  Barriers to Discharge: Improving DKA-improving AKI-improving  hypernatremia-still with severe encephalopathy.  Not yet stable for discharge.  Antimicrobial  agents: Anti-infectives (From admission, onward)    None        MEDICATIONS: Scheduled Meds:  amLODipine  5 mg Oral Daily   aspirin  81 mg Oral Daily   atorvastatin  40 mg Oral Daily   enoxaparin (LOVENOX) injection  40 mg Subcutaneous Q24H   insulin aspart  0-9 Units Subcutaneous TID WC   insulin glargine-yfgn  12 Units Subcutaneous Daily   memantine  5 mg Oral Daily   pantoprazole (PROTONIX) IV  40 mg Intravenous Q24H   Continuous Infusions:  sodium chloride 50 mL/hr at 11/29/20 0756    PRN Meds:.dextrose, haloperidol lactate   I have personally reviewed following labs and imaging studies  LABORATORY DATA: CBC: Recent Labs  Lab 11/26/20 1214 11/26/20 1441 11/26/20 1451  WBC 14.0*  --   --   NEUTROABS 12.4*  --   --   HGB 14.0 11.9* 11.9*  HCT 45.8 35.0* 35.0*  MCV 100.2*  --   --   PLT 134*  --   --      Basic Metabolic Panel: Recent Labs  Lab 11/27/20 1425 11/27/20 1633 11/27/20 2201 11/28/20 0447 11/29/20 0313  NA 151* 149* 149* 143 148*  K 3.9 3.7 3.4* 3.0* 3.6  CL 118* 119* 117* 111 114*  CO2 25 25 25 24 24   GLUCOSE 178* 195* 225* 184* 222*  BUN 29* 28* 26* 24* 23  CREATININE 0.88 0.90 1.01* 1.13* 1.23*  CALCIUM 9.2 9.3 9.0 8.7* 8.7*  MG  --   --   --   --  1.6*     GFR: Estimated Creatinine Clearance: 32.3 mL/min (A) (by C-G formula based on SCr of 1.23 mg/dL (H)).  Liver Function Tests: Recent Labs  Lab 11/26/20 1214  AST 16  ALT 20  ALKPHOS 65  BILITOT 1.0  PROT 7.2  ALBUMIN 3.7    No results for input(s): LIPASE, AMYLASE in the last 168 hours. No results for input(s): AMMONIA in the last 168 hours.  Coagulation Profile: Recent Labs  Lab 11/26/20 1229  INR 1.1     Cardiac Enzymes: No results for input(s): CKTOTAL, CKMB, CKMBINDEX, TROPONINI in the last 168 hours.  BNP (last 3 results) No results for input(s): PROBNP in the last 8760 hours.  Lipid Profile: No results for input(s): CHOL, HDL, LDLCALC, TRIG,  CHOLHDL, LDLDIRECT in the last 72 hours.  Thyroid Function Tests: No results for input(s): TSH, T4TOTAL, FREET4, T3FREE, THYROIDAB in the last 72 hours.  Anemia Panel: No results for input(s): VITAMINB12, FOLATE, FERRITIN, TIBC, IRON, RETICCTPCT in the last 72 hours.  Urine analysis:    Component Value Date/Time   COLORURINE YELLOW 11/26/2020 [place] 11/26/2020 1349   LABSPEC 1.026 11/26/2020 1349   PHURINE 5.0 11/26/2020 1349   GLUCOSEU >=500 (A) 11/26/2020 1349   HGBUR NEGATIVE 11/26/2020 [place] 11/26/2020 1349   KETONESUR 80 (A) 11/26/2020 1349   PROTEINUR NEGATIVE 11/26/2020 1349   NITRITE NEGATIVE 11/26/2020 1349   LEUKOCYTESUR NEGATIVE 11/26/2020 1349    Sepsis Labs: Lactic Acid, Venous    Component Value Date/Time   LATICACIDVEN 2.2 (HH) 11/26/2020 1420    MICROBIOLOGY: Recent Results (from the past 240 hour(s))  Resp Panel by RT-PCR (Flu A&B, Covid) Nasopharyngeal Swab     Status: None   Collection Time: 11/26/20 12:14 PM   Specimen: Nasopharyngeal Swab; Nasopharyngeal(NP) swabs in vial transport  medium  Result Value Ref Range Status   SARS Coronavirus 2 by RT PCR NEGATIVE NEGATIVE Final    Comment: (NOTE) SARS-CoV-2 target nucleic acids are NOT DETECTED.  The SARS-CoV-2 RNA is generally detectable in upper respiratory specimens during the acute phase of infection. The lowest concentration of SARS-CoV-2 viral copies this assay can detect is 138 copies/mL. A negative result does not preclude SARS-Cov-2 infection and should not be used as the sole basis for treatment or other patient management decisions. A negative result may occur with  improper specimen collection/handling, submission of specimen other than nasopharyngeal swab, presence of viral mutation(s) within the areas targeted by this assay, and inadequate number of viral copies(<138 copies/mL). A negative result must be combined with clinical observations,  patient history, and epidemiological information. The expected result is Negative.  Fact Sheet for Patients:  EntrepreneurPulse.com.au  Fact Sheet for Healthcare Providers:  IncredibleEmployment.be  This test is no t yet approved or cleared by the [place] FDA and  has been authorized for detection and/or diagnosis of SARS-CoV-2 by FDA under an Emergency Use Authorization (EUA). This EUA will remain  in effect (meaning this test can be used) for the duration of the COVID-19 declaration under Section 564(b)(1) of the Act, 21 U.S.C.section 360bbb-3(b)(1), unless the authorization is terminated  or revoked sooner.       Influenza A by PCR NEGATIVE NEGATIVE Final   Influenza B by PCR NEGATIVE NEGATIVE Final    Comment: (NOTE) The Xpert Xpress SARS-CoV-2/FLU/RSV plus assay is intended as an aid in the diagnosis of influenza from Nasopharyngeal swab specimens and should not be used as a sole basis for treatment. Nasal washings and aspirates are unacceptable for Xpert Xpress SARS-CoV-2/FLU/RSV testing.  Fact Sheet for Patients: EntrepreneurPulse.com.au  Fact Sheet for Healthcare Providers: IncredibleEmployment.be  This test is not yet approved or cleared by the [place] FDA and has been authorized for detection and/or diagnosis of SARS-CoV-2 by FDA under an Emergency Use Authorization (EUA). This EUA will remain in effect (meaning this test can be used) for the duration of the COVID-19 declaration under Section 564(b)(1) of the Act, 21 U.S.C. section 360bbb-3(b)(1), unless the authorization is terminated or revoked.  Performed at [facility], [place] [address] [place], [place] [address]   Blood culture (routine single)     Status: None (Preliminary result)   Collection Time: 11/26/20 12:29 PM   Specimen: BLOOD RIGHT FOREARM  Result Value Ref Range Status   Specimen Description BLOOD RIGHT  FOREARM  Final   Special Requests   Final    BOTTLES DRAWN AEROBIC AND ANAEROBIC Blood Culture adequate volume   Culture   Final    NO GROWTH 3 DAYS Performed at [facility], [place] [address] [place], [place] [address]    Report Status PENDING  Incomplete  Urine Culture     Status: None   Collection Time: 11/26/20  1:37 PM   Specimen: In/Out Cath Urine  Result Value Ref Range Status   Specimen Description IN/OUT CATH URINE  Final   Special Requests NONE  Final   Culture   Final    NO GROWTH Performed at [facility], [place] [address], [place], [place] [address]    Report Status 11/27/2020 FINAL  Final    RADIOLOGY STUDIES/RESULTS: MR BRAIN WO CONTRAST  Result Date: 11/27/2020 CLINICAL DATA:  Mental status change, unknown cause. EXAM: MRI HEAD WITHOUT CONTRAST TECHNIQUE: Multiplanar, multiecho pulse sequences of the brain and  surrounding structures were obtained without intravenous contrast. COMPARISON:  Head CT 11/26/2020 and MRI 11/06/2020 FINDINGS: Brain: There is a small amount of residual signal abnormality at the site of the superior cerebellar infarcts on the prior MRI. No acute infarct, intracranial hemorrhage, mass, midline shift, or extra-axial fluid collection is identified. Patchy and confluent T2 hyperintensities in the cerebral white matter bilaterally are unchanged and nonspecific but compatible with moderate chronic small vessel ischemic disease. Mild cerebral and cerebellar atrophy and asymmetrically severe right mesial temporal lobe volume loss are again noted. Vascular: Major intracranial vascular flow voids are preserved. Skull and upper cervical spine: Unremarkable bone marrow signal. Sinuses/Orbits: Bilateral cataract extraction. Paranasal sinuses and mastoid air cells are clear. Other: None. IMPRESSION: 1. No acute intracranial abnormality. 2. Moderate chronic small vessel ischemic disease. 3. Cerebral atrophy including asymmetrically severe right temporal  lobe volume loss. Electronically Signed   By: Logan Bores M.D.   On: 11/27/2020 18:46     LOS: 3 days   Oren Binet, MD  [facility]    To contact the attending provider between 7A-7P or the covering provider during after hours 7P-7A, please log into the web site [email or web address] and access using universal [place] password for that web site. If you do not have the password, please call the hospital operator.  11/29/2020, 2:42 PM

## 2020-11-30 DIAGNOSIS — N179 Acute kidney failure, unspecified: Secondary | ICD-10-CM

## 2020-11-30 LAB — BASIC METABOLIC PANEL
Anion gap: 4 — ABNORMAL LOW (ref 5–15)
BUN: 26 mg/dL — ABNORMAL HIGH (ref 8–23)
CO2: 26 mmol/L (ref 22–32)
Calcium: 8.5 mg/dL — ABNORMAL LOW (ref 8.9–10.3)
Chloride: 112 mmol/L — ABNORMAL HIGH (ref 98–111)
Creatinine, Ser: 1.16 mg/dL — ABNORMAL HIGH (ref 0.44–1.00)
GFR, Estimated: 47 mL/min — ABNORMAL LOW (ref >60)
Glucose, Bld: 241 mg/dL — ABNORMAL HIGH (ref 70–99)
Potassium: 3.5 mmol/L (ref 3.5–5.1)
Sodium: 142 mmol/L (ref 135–145)

## 2020-11-30 LAB — CBC
HCT: 33.5 % — ABNORMAL LOW (ref 36.0–46.0)
Hemoglobin: 10.5 g/dL — ABNORMAL LOW (ref 12.0–15.0)
MCH: 31.3 pg (ref 26.0–34.0)
MCHC: 31.3 g/dL (ref 30.0–36.0)
MCV: 99.7 fL (ref 80.0–100.0)
Platelets: 110 10*3/uL — ABNORMAL LOW (ref 150–400)
RBC: 3.36 MIL/uL — ABNORMAL LOW (ref 3.87–5.11)
RDW: 12.4 % (ref 11.5–15.5)
WBC: 8.6 10*3/uL (ref 4.0–10.5)
nRBC: 0.2 % (ref 0.0–0.2)

## 2020-11-30 LAB — GLUCOSE, CAPILLARY
Glucose-Capillary: 223 mg/dL — ABNORMAL HIGH (ref 70–99)
Glucose-Capillary: 218 mg/dL — ABNORMAL HIGH (ref 70–99)
Glucose-Capillary: 150 mg/dL — ABNORMAL HIGH (ref 70–99)
Glucose-Capillary: 142 mg/dL — ABNORMAL HIGH (ref 70–99)

## 2020-11-30 NOTE — Care Management Important Message (Signed)
Important Message  Patient Details  Name: Stacey Fuentes MRN: [id number] Date of Birth: January 13, 1936   Medicare Important Message Given:  Yes  Patient was not able to sign .  Signed copy left at the patient bedside.    [place] 11/30/2020, 8:47 AM

## 2020-11-30 NOTE — TOC Progression Note (Addendum)
Transition of Care [facility]) - Progression Note    Patient Details  Name: Tapanga Ottaway MRN: [id number] Date of Birth: [birth year]  Transition of Care [facility]) CM/SW Contact  Joanne Chars, LCSW Phone Number: 11/30/2020, 9:35 AM  Clinical Narrative:   CSW informed by RN chat that pt daughter has decided to decline SNF and take pt home.  CSW spoke with daughter [name] by phone who confirms this.  Discussed most recent PT note that pt was able to stand but not walk, discussed the amount of care that pt will require at home.  [name] aware of this, still wants to move forward. Discussed what is at place at home.  Pt has [place] aide currently for 2 hours per day, [name] does not have the name of the agency off hand, agreed to get this to [place] by noon.  [name] stated she needs more hours than this.  CSW discussed with her that the process of getting additional hours approved and that this would definitely take some time, discussed that pt could not remain in the hospital while this was in process.  Discussed HH options and [name] in agreement with [facility] referral, no agency preference.  She also requested hospital bed and wheel chair. CSW shared that pt likely DC today today, [name] said she did not think they could be ready today.  Agreed to start working on all this.  MD informed, will order hospital bed and wheelchair.   1250: CSW attempted to call and text [name] regarding [place] aide agency, received text response stating her brother has passed and she has to plan the funeral and that she needs care to be in place prior to pt coming home.  CSW apologized for her loss, shared that MD was willing to wait until Sat for DC, CSW asked again if she would consider SNF as a better way to meet pt needs for significant amount of care.  [name] responded with the contact for the [facility] aide agency: [facility], April, [phone].  CSW spoke with April at [facility] care.  They provide 2.5 hours of [place] aide per day and  will plan to restart tomorrow, Saturday.  Sarah at [place] accepts pt for [facility].  Hospital Bed and Wheelchair ordered from [place].   CSW attempted again to confirm with [name] that she will need to pick pt up tomorrow but received no response.   1510: CSW received call from [place], pt son in law.  [phone].  He lives in [place] but was contacted by [place] and is trying to help while they are working on some funeral issues for a family member.  He confirmed that they do not want SNF or to try another SNF, CSW updated him on [facility] and [place] aide.  He will help to coordinate delivery of DME.  His wife is in [place] and is going to assist with caring for pt until increased help can be arranged through the [facility] aide.  He confirmed that they will plan to take pt home tomorrow once delivery of DME is confirmed.   CSW updated [employer] who will coordinate with [place] on delivery.     Expected Discharge Plan: [place] Barriers to Discharge: Continued Medical Work up, Other (must enter comment) (insurance [place])  Expected Discharge Plan and Services Expected Discharge Plan: [place] Choice: [place] arrangements for the past 2 months: [place]  Social Determinants of Health (SDOH) Interventions    Readmission Risk Interventions No flowsheet data found.

## 2020-11-30 NOTE — Progress Notes (Signed)
Occupational Therapy Treatment Patient Details Name: Stacey Fuentes MRN: [id number] DOB: Oct 04, 1935 Today's Date: 11/30/2020   History of present illness 85 y.o. female presents to [facility] hospital on 11/26/2020 with AMS, found to have DKA. PMH includes dementia, DM-2, recent CVA (September 2022).   OT comments  Patient supine in bed, pleasantly confused but appears happy to see OT.  Pt oriented to name only (reports her birthday is in dec), following simple commands with multimodal cueing.  Requires max assist for bed mobility, maintains sitting balance at EOB with min guard to wash face with min assist.  Stood with mod assist using RW.  Noted family declining SNF, therefore updated dc plan to [facility] with max services ([place] OT and aide).  Pt will require 24/7 assist.  Will follow.    Recommendations for follow up therapy are one component of a multi-disciplinary discharge planning process, led by the attending physician.  Recommendations may be updated based on patient status, additional functional criteria and insurance authorization.    Follow Up Recommendations  Home health OT;Supervision/Assistance - 24 hour (family declining SNF- [facility])    Equipment Recommendations  3 in 1 bedside commode    Recommendations for Other Services      Precautions / Restrictions Precautions Precautions: Fall Precaution Comments: dementia Restrictions Weight Bearing Restrictions: No       Mobility Bed Mobility Overal bed mobility: Needs Assistance Bed Mobility: Supine to Sit;Sit to Supine     Supine to sit: Max assist;HOB elevated Sit to supine: Max assist;HOB elevated   General bed mobility comments: max assist for LB and trunk support to EOB    Transfers Overall transfer level: Needs assistance Equipment used: Rolling walker (2 wheeled);2 person hand held assist Transfers: Sit to/from Stand Sit to Stand: Mod assist         General transfer comment: mod assist to power up and steady pt  pulling on RW    Balance Overall balance assessment: Needs assistance Sitting-balance support: No upper extremity supported;Feet supported Sitting balance-Leahy Scale: Fair Sitting balance - Comments: minG for static sitting at edge of bed   Standing balance support: Bilateral upper extremity supported;During functional activity Standing balance-Leahy Scale: Poor Standing balance comment: relies on external and BUE support                           ADL either performed or assessed with clinical judgement   ADL Overall ADL's : Needs assistance/impaired     Grooming: Wash/dry face;Minimal assistance;Sitting               Lower Body Dressing: Total assistance;Sit to/from stand   Toilet Transfer: Moderate assistance;RW Toilet Transfer Details (indicate cue type and reason): simulated side stepping towards HOB         Functional mobility during ADLs: Maximal assistance;Moderate assistance;Rolling walker;Cueing for safety;Cueing for sequencing General ADL Comments: pt limited by cognition, weakness and balance     Vision       Perception     Praxis      Cognition Arousal/Alertness: Awake/alert Behavior During Therapy: Flat affect Overall Cognitive Status: History of cognitive impairments - at baseline                                 General Comments: dementia at baseline. Follows simple commands with multimodal cueing. Oriented to name only (reports her birthday is in dec) and thinks she  is at a birthday party.        Exercises     Shoulder Instructions       General Comments VSS on RA    Pertinent Vitals/ Pain       Pain Assessment: Faces Faces Pain Scale: No hurt  Home Living                                          Prior Functioning/Environment              Frequency  Min 2X/week        Progress Toward Goals  OT Goals(current goals can now be found in the care plan section)  Progress towards  OT goals: Progressing toward goals  Acute Rehab OT Goals Patient Stated Goal: pt unable to state OT Goal Formulation: Patient unable to participate in goal setting  Plan Discharge plan needs to be updated;Frequency remains appropriate    Co-evaluation                 AM-PAC OT "6 Clicks" Daily Activity     Outcome Measure   Help from another person eating meals?: A Lot Help from another person taking care of personal grooming?: A Lot Help from another person toileting, which includes using toliet, bedpan, or urinal?: Total Help from another person bathing (including washing, rinsing, drying)?: A Lot Help from another person to put on and taking off regular upper body clothing?: Total Help from another person to put on and taking off regular lower body clothing?: Total 6 Click Score: 9    End of Session Equipment Utilized During Treatment: Rolling walker  OT Visit Diagnosis: Other abnormalities of gait and mobility (R26.89);Muscle weakness (generalized) (M62.81);Cognitive communication deficit (R41.841);Other symptoms and signs involving cognitive function   Activity Tolerance Patient tolerated treatment well   Patient Left in bed;with call bell/phone within reach;with bed alarm set   Nurse Communication Mobility status        Time: 1141-1159 OT Time Calculation (min): 18 min  Charges: OT General Charges $OT Visit: 1 Visit OT Treatments $Self Care/Home Management : 8-22 mins  Jolaine Artist, OT [place] Pager [phone] Office [phone]   Delight Stare 11/30/2020, 12:58 PM

## 2020-11-30 NOTE — Progress Notes (Signed)
Inpatient Diabetes Program Recommendations  AACE/ADA: New Consensus Statement on Inpatient Glycemic Control (2015)  Target Ranges:  Prepandial:   less than 140 mg/dL      Peak postprandial:   less than 180 mg/dL (1-2 hours)      Critically ill patients:  140 - 180 mg/dL  Results for LINN, CLAVIN (MRN [id number]) as of 11/30/2020 09:32  Ref. Range 11/29/2020 09:02 11/29/2020 12:19 11/29/2020 15:32 11/29/2020 19:16  Glucose-Capillary Latest Ref Range: 70 - 99 mg/dL 238 (H)  5 units Novolog  12 units Semglee @0856   172 (H)  2 units Novolog  109 (H)  No Insulin needed 191 (H)  Results for ABBEE, CREMEENS (MRN [id number]) as of 11/30/2020 09:32  Ref. Range 11/30/2020 08:29  Glucose-Capillary Latest Ref Range: 70 - 99 mg/dL 223 (H)    Outpatient DM Meds: Novolog 0-10 units tid with meals Semglee 15 units q HS   Current Orders: Semglee 12 units daily Novolog Sensitive Correction Scale/ SSI (0-9 units) TID AC   MD- Note AM CBGs remain elevated the last 2 days (238 yesterday AM, 223 this AM)  Please consider increasing the Semglee to 15 units Daily (home dose)  If 12 unit dose already given this AM, please also order Semglee 3 units X 1 dose to be given this AM as well    --Will follow patient during hospitalization--  Wyn Quaker RN, MSN, CDE Diabetes Coordinator Inpatient Glycemic Control Team Team Pager: [phone] (8a-5p)

## 2020-11-30 NOTE — Progress Notes (Signed)
PROGRESS NOTE        PATIENT DETAILS Name: Stacey Fuentes Age: 85 y.o. Sex: female Date of Birth: 1935-08-09 Admit Date: 11/26/2020 Admitting Physician Oswald Hillock, MD KPT:[id number], Caren Griffins, MD  Brief Narrative: Patient is a 85 y.o. female with history of dementia, DM-2, recent CVA-presented to the hospital with altered mental status-found to have DKA and subsequently admitted to the hospitalist service.  Subjective: Lying comfortably in bed-answer simple questions appropriately.  Per nursing staff-no major events overnight.  Per nursing staff-patient eating a decent quantity of meals.  Objective: Vitals: Blood pressure 136/78, pulse 88, temperature 98.2 F (36.8 C), temperature source Oral, resp. rate 13, height 5\' 2"  (1.575 m), weight 72.6 kg, SpO2 97 %.   Exam: Gen Exam:not in any distress HEENT:atraumatic, normocephalic Chest: B/L clear to auscultation anteriorly CVS:S1S2 regular Abdomen:soft non tender, non distended Extremities:no edema Neurology: Non focal Skin: no rash    Pertinent Labs/Radiology: Na: 142 K: 3.5 creatinine:1.16  10/17>>Blood culture: No growth 10/17>>Urine Culture: No growth  10/17>>CXR: No pneumonia 10/17>> CT head: No acute findings. 10/18>> MRI brain: No acute infarct-Cerebral atrophy with asymmetrical severe right temporal volume loss (similar findings in September 2022)  10/18>> EEG: No seizures  Assessment/Plan: DKA: Resolved-has been transitioned to SQ insulin.  DM-2 (A1c 8.3 on 9/28): CBGs relatively stable-allow permissive hyperglycemia-as patient with frailty/dementia.  Continue Lantus 12 units daily and SSI.  Recent Labs    11/29/20 1532 11/29/20 1916 11/30/20 0829  GLUCAP 109* 191* 223*      Acute metabolic encephalopathy superimposed on dementia: Acute metabolic encephalopathy was likely from DKA-she has improved-mentation continues to fluctuate which is likely due to delirium.  MRI brain negative  for abnormality, EEG negative for seizures.  Continue supportive care.   Hypernatremia: Sodium levels have stabilized-stop all IV fluids and monitor.    AKI: Likely hemodynamically mediated-improving-volume status stable-Per nursing staff-patient starting to consume more oral intake-stop all IV fluids.  HTN: BP stable-amlodipine 2.5 mg p.o. daily.    HLD: Continue statin.  History of recent cerebellar vermis/left cerebral hemisphere September 2022: Continue aspirin (reviewed most recent DC summary-does not require Plavix any longer)-remains on statin.  MRI brain on 10/18 negative for CVA.    Dementia/delirium: Unclear whether she is back to her baseline as encephalopathy has improved significantly.  Continue Namenda-if mental status improves-we will resume Zyprexa over the next few days.  Continue to maintain delirium precautions-at risk for delirium during this hospital stay.     Chronic debility/deconditioning: Likely worsened by acute illness-initially from SNF-however family now desires to take patient home with home health services.  Case management/social work working with family-DME ordered.  Procedures: None Consults: None DVT Prophylaxis: Lovenox Code Status:Full code Family Communication: Daughter-[name] [phone] updated over the phone on 10/21  Time spent: 25 minutes-Greater than 50% of this time was spent in counseling, explanation of diagnosis, planning of further management, and coordination of care.  Diet: Diet Order             DIET DYS 2 Room service appropriate? Yes; Fluid consistency: Thin  Diet effective now                      Disposition Plan: Status is: Inpatient  Remains inpatient appropriate because: Needs inpatient level of treatment/IV medications.  Barriers to Discharge: Stopping IVF-watching another day-family desires to  take patient home with maximal home health services-and DME being ordered.  Suspect home on  10/22.  Antimicrobial agents: Anti-infectives (From admission, onward)    None        MEDICATIONS: Scheduled Meds:  amLODipine  2.5 mg Oral Daily   aspirin  81 mg Oral Daily   atorvastatin  40 mg Oral Daily   enoxaparin (LOVENOX) injection  40 mg Subcutaneous Q24H   insulin aspart  0-9 Units Subcutaneous TID WC   insulin glargine-yfgn  12 Units Subcutaneous Daily   memantine  5 mg Oral Daily   pantoprazole (PROTONIX) IV  40 mg Intravenous Q24H   Continuous Infusions:    PRN Meds:.dextrose, haloperidol lactate   I have personally reviewed following labs and imaging studies  LABORATORY DATA: CBC: Recent Labs  Lab 11/26/20 1214 11/26/20 1441 11/26/20 1451 11/30/20 0233  WBC 14.0*  --   --  8.6  NEUTROABS 12.4*  --   --   --   HGB 14.0 11.9* 11.9* 10.5*  HCT 45.8 35.0* 35.0* 33.5*  MCV 100.2*  --   --  99.7  PLT 134*  --   --  110*     Basic Metabolic Panel: Recent Labs  Lab 11/27/20 1633 11/27/20 2201 11/28/20 0447 11/29/20 0313 11/30/20 0233  NA 149* 149* 143 148* 142  K 3.7 3.4* 3.0* 3.6 3.5  CL 119* 117* 111 114* 112*  CO2 25 25 24 24 26   GLUCOSE 195* 225* 184* 222* 241*  BUN 28* 26* 24* 23 26*  CREATININE 0.90 1.01* 1.13* 1.23* 1.16*  CALCIUM 9.3 9.0 8.7* 8.7* 8.5*  MG  --   --   --  1.6*  --      GFR: Estimated Creatinine Clearance: 34.3 mL/min (A) (by C-G formula based on SCr of 1.16 mg/dL (H)).  Liver Function Tests: Recent Labs  Lab 11/26/20 1214  AST 16  ALT 20  ALKPHOS 65  BILITOT 1.0  PROT 7.2  ALBUMIN 3.7    No results for input(s): LIPASE, AMYLASE in the last 168 hours. No results for input(s): AMMONIA in the last 168 hours.  Coagulation Profile: Recent Labs  Lab 11/26/20 1229  INR 1.1     Cardiac Enzymes: No results for input(s): CKTOTAL, CKMB, CKMBINDEX, TROPONINI in the last 168 hours.  BNP (last 3 results) No results for input(s): PROBNP in the last 8760 hours.  Lipid Profile: No results for input(s):  CHOL, HDL, LDLCALC, TRIG, CHOLHDL, LDLDIRECT in the last 72 hours.  Thyroid Function Tests: No results for input(s): TSH, T4TOTAL, FREET4, T3FREE, THYROIDAB in the last 72 hours.  Anemia Panel: No results for input(s): VITAMINB12, FOLATE, FERRITIN, TIBC, IRON, RETICCTPCT in the last 72 hours.  Urine analysis:    Component Value Date/Time   COLORURINE YELLOW 11/26/2020 [place] 11/26/2020 1349   LABSPEC 1.026 11/26/2020 1349   PHURINE 5.0 11/26/2020 1349   GLUCOSEU >=500 (A) 11/26/2020 1349   HGBUR NEGATIVE 11/26/2020 [place] 11/26/2020 1349   KETONESUR 80 (A) 11/26/2020 1349   PROTEINUR NEGATIVE 11/26/2020 1349   NITRITE NEGATIVE 11/26/2020 1349   LEUKOCYTESUR NEGATIVE 11/26/2020 1349    Sepsis Labs: Lactic Acid, Venous    Component Value Date/Time   LATICACIDVEN 2.2 (HH) 11/26/2020 1420    MICROBIOLOGY: Recent Results (from the past 240 hour(s))  Resp Panel by RT-PCR (Flu A&B, Covid) Nasopharyngeal Swab     Status: None   Collection Time: 11/26/20 12:14 PM  Specimen: Nasopharyngeal Swab; Nasopharyngeal(NP) swabs in vial transport medium  Result Value Ref Range Status   SARS Coronavirus 2 by RT PCR NEGATIVE NEGATIVE Final    Comment: (NOTE) SARS-CoV-2 target nucleic acids are NOT DETECTED.  The SARS-CoV-2 RNA is generally detectable in upper respiratory specimens during the acute phase of infection. The lowest concentration of SARS-CoV-2 viral copies this assay can detect is 138 copies/mL. A negative result does not preclude SARS-Cov-2 infection and should not be used as the sole basis for treatment or other patient management decisions. A negative result may occur with  improper specimen collection/handling, submission of specimen other than nasopharyngeal swab, presence of viral mutation(s) within the areas targeted by this assay, and inadequate number of viral copies(<138 copies/mL). A negative result must be combined  with clinical observations, patient history, and epidemiological information. The expected result is Negative.  Fact Sheet for Patients:  EntrepreneurPulse.com.au  Fact Sheet for Healthcare Providers:  IncredibleEmployment.be  This test is no t yet approved or cleared by the [place] FDA and  has been authorized for detection and/or diagnosis of SARS-CoV-2 by FDA under an Emergency Use Authorization (EUA). This EUA will remain  in effect (meaning this test can be used) for the duration of the COVID-19 declaration under Section 564(b)(1) of the Act, 21 U.S.C.section 360bbb-3(b)(1), unless the authorization is terminated  or revoked sooner.       Influenza A by PCR NEGATIVE NEGATIVE Final   Influenza B by PCR NEGATIVE NEGATIVE Final    Comment: (NOTE) The Xpert Xpress SARS-CoV-2/FLU/RSV plus assay is intended as an aid in the diagnosis of influenza from Nasopharyngeal swab specimens and should not be used as a sole basis for treatment. Nasal washings and aspirates are unacceptable for Xpert Xpress SARS-CoV-2/FLU/RSV testing.  Fact Sheet for Patients: EntrepreneurPulse.com.au  Fact Sheet for Healthcare Providers: IncredibleEmployment.be  This test is not yet approved or cleared by the [place] FDA and has been authorized for detection and/or diagnosis of SARS-CoV-2 by FDA under an Emergency Use Authorization (EUA). This EUA will remain in effect (meaning this test can be used) for the duration of the COVID-19 declaration under Section 564(b)(1) of the Act, 21 U.S.C. section 360bbb-3(b)(1), unless the authorization is terminated or revoked.  Performed at [facility], [place] [address], [place], [place] [address]   Blood culture (routine single)     Status: None (Preliminary result)   Collection Time: 11/26/20 12:29 PM   Specimen: BLOOD RIGHT FOREARM  Result Value Ref Range Status    Specimen Description BLOOD RIGHT FOREARM  Final   Special Requests   Final    BOTTLES DRAWN AEROBIC AND ANAEROBIC Blood Culture adequate volume   Culture   Final    NO GROWTH 3 DAYS Performed at [facility], [place] [address] [place], [place] [address]    Report Status PENDING  Incomplete  Urine Culture     Status: None   Collection Time: 11/26/20  1:37 PM   Specimen: In/Out Cath Urine  Result Value Ref Range Status   Specimen Description IN/OUT CATH URINE  Final   Special Requests NONE  Final   Culture   Final    NO GROWTH Performed at [facility], [place] [address], [place], [place] [address]    Report Status 11/27/2020 FINAL  Final    RADIOLOGY STUDIES/RESULTS: No results found.   LOS: 4 days   Oren Binet, MD  [facility]    To contact the  attending provider between 7A-7P or the covering provider during after hours 7P-7A, please log into the web site [email or web address] and access using universal [place] password for that web site. If you do not have the password, please call the hospital operator.  11/30/2020, 11:49 AM

## 2020-12-01 DIAGNOSIS — E1169 Type 2 diabetes mellitus with other specified complication: Secondary | ICD-10-CM

## 2020-12-01 DIAGNOSIS — Z794 Long term (current) use of insulin: Secondary | ICD-10-CM

## 2020-12-01 LAB — GLUCOSE, CAPILLARY
Glucose-Capillary: 184 mg/dL — ABNORMAL HIGH (ref 70–99)
Glucose-Capillary: 183 mg/dL — ABNORMAL HIGH (ref 70–99)
Glucose-Capillary: 170 mg/dL — ABNORMAL HIGH (ref 70–99)
Glucose-Capillary: 119 mg/dL — ABNORMAL HIGH (ref 70–99)
Glucose-Capillary: 125 mg/dL — ABNORMAL HIGH (ref 70–99)

## 2020-12-01 LAB — CULTURE, BLOOD (SINGLE)
Culture: NO GROWTH
Special Requests: ADEQUATE

## 2020-12-01 MED ORDER — BLOOD GLUCOSE METER KIT: PACK | 0 refills | Status: DC

## 2020-12-01 MED ORDER — VITAMIN B-12 1000 MCG PO TABS: 1000.0000 ug | ORAL_TABLET | Freq: Every day | ORAL | 1 refills | Status: AC

## 2020-12-01 MED ORDER — FISH OIL 1000 MG PO CAPS: 1.0000 | ORAL_CAPSULE | Freq: Every day | ORAL | 1 refills | Status: AC

## 2020-12-01 MED ORDER — ASPIRIN 81 MG PO TABS: 81.0000 mg | ORAL_TABLET | Freq: Every day | ORAL | 2 refills | Status: AC

## 2020-12-01 MED ORDER — ESOMEPRAZOLE MAGNESIUM 40 MG PO CPDR: 40.0000 mg | DELAYED_RELEASE_CAPSULE | Freq: Every day | ORAL | 1 refills | Status: DC

## 2020-12-01 MED ORDER — INSULIN ASPART 100 UNIT/ML FLEXPEN: PEN_INJECTOR | SUBCUTANEOUS | 11 refills | Status: DC

## 2020-12-01 MED ORDER — AMLODIPINE BESYLATE 2.5 MG PO TABS
2.5000 mg | ORAL_TABLET | Freq: Every day | ORAL | 2 refills | Status: DC
Start: 2020-12-01 — End: 2020-12-20

## 2020-12-01 MED ORDER — LATANOPROST 0.005 % OP SOLN: 1.0000 [drp] | Freq: Every evening | OPHTHALMIC | 5 refills | Status: AC

## 2020-12-01 MED ORDER — INSULIN ASPART 100 UNIT/ML IJ SOLN: 0.0000 [IU] | INTRAMUSCULAR | 2 refills | Status: DC

## 2020-12-01 MED ORDER — ATORVASTATIN CALCIUM 40 MG PO TABS: 40.0000 mg | ORAL_TABLET | Freq: Every day | ORAL | 1 refills | Status: AC

## 2020-12-01 MED ORDER — MEMANTINE HCL 5 MG PO TABS: 5.0000 mg | ORAL_TABLET | Freq: Every day | ORAL | 2 refills | Status: AC

## 2020-12-01 MED ORDER — CALCIUM CARBONATE 1500 (600 CA) MG PO TABS: 600.0000 mg | ORAL_TABLET | Freq: Every day | ORAL | 1 refills | Status: AC

## 2020-12-01 MED ORDER — ADULT MULTIVITAMIN W/MINERALS CH: 1.0000 | ORAL_TABLET | Freq: Every day | ORAL | 1 refills | Status: AC

## 2020-12-01 MED ORDER — INSULIN PEN NEEDLE 32G X 4 MM MISC: 1.0000 | 0 refills | Status: AC | PRN

## 2020-12-01 MED ORDER — INSULIN GLARGINE 100 UNIT/ML SOLOSTAR PEN: 12.0000 [IU] | PEN_INJECTOR | Freq: Every day | SUBCUTANEOUS | 1 refills | Status: AC

## 2020-12-01 NOTE — Discharge Summary (Signed)
PATIENT DETAILS Name: Stacey Fuentes Age: 85 y.o. Sex: female Date of Birth: 02/11/1935 MRN: [id number]. Admitting Physician: Oswald Hillock, MD [id number], Caren Griffins, MD  Admit Date: 11/26/2020 Discharge date: 12/01/2020  Recommendations for Outpatient Follow-up:  Follow up with PCP in 1-2 weeks Please obtain CMP/CBC in one week Please ensure outpatient follow-up with neurology  Admitted From:  Home  Disposition: Home with home health services (family refused for patient to go back to SNF)   Home Health: Yes  Equipment/Devices: None  Discharge Condition: Stable  CODE STATUS: FULL CODE  Diet recommendation:  Diet Order             Diet - low sodium heart healthy           Diet Carb Modified           DIET DYS 2 Room service appropriate? Yes; Fluid consistency: Thin  Diet effective now                    Brief Summary: Patient is a 85 y.o. female with history of dementia, DM-2, recent CVA-presented to the hospital with altered mental status-found to have DKA and subsequently admitted to the hospitalist service.  Pertinent Labs/Radiology: 10/17>>Blood culture: No growth 10/17>>Urine Culture: No growth  10/17>>CXR: No pneumonia 10/17>> CT head: No acute findings. 10/18>> MRI brain: No acute infarct-Cerebral atrophy with asymmetrical severe right temporal volume loss (similar findings in September 2022)  10/18>> EEG: No seizures  Brief Hospital Course: DKA: Resolved-has been transitioned to SQ insulin.   DM-2 (A1c 8.3 on 9/28): CBGs relatively stable-allow permissive hyperglycemia-as patient with frailty/dementia.  Continue Lantus 12 units daily and SSI.  Acute metabolic encephalopathy superimposed on dementia: Acute metabolic encephalopathy was likely from DKA-she has improved-mentation continues to fluctuate which is likely due to delirium.  This morning-she awoke-and was able to follow simple commands and respond appropriately to simple questions. MRI  brain negative for abnormality, EEG negative for seizures.  Continue supportive care.    Hypernatremia: Sodium levels have stabilized-stop all IV fluids and monitor.     AKI: Likely hemodynamically mediated-improving-volume status stable-treated briefly with IV fluids.   HTN: BP stable-amlodipine 2.5 mg p.o. daily.     HLD: Continue statin.   History of recent cerebellar vermis/left cerebral hemisphere September 2022: Continue aspirin (reviewed most recent DC summary-does not require Plavix any longer)-remains on statin.  MRI brain on 10/18 negative for CVA.     Dementia/delirium: Unclear whether she is back to her baseline as encephalopathy has improved significantly.  Continue Namenda-Haldol/Zyprexa remains on hold-as she has not had any significant/severe amount of delirium.  Patient to follow-up with her primary neurologist for further continued care and medication optimization.   Chronic debility/deconditioning: Likely worsened by acute illness-initially from SNF-however family now desires to take patient home with home health services.  Case management/social worker followed closely-and DME has been ordered.  Stable for discharge today.   Procedures None  Discharge Diagnoses:  Active Problems:   DKA (diabetic ketoacidosis) ([place])   Discharge Instructions:  Activity:  As tolerated with Full fall precautions use walker/cane & assistance as needed  Discharge Instructions     Ambulatory referral to Neurology   Complete by: As directed    An appointment is requested in approximately: 4 weeks   Call MD for:  extreme fatigue   Complete by: As directed    Call MD for:  persistant dizziness or light-headedness   Complete by: As directed  Diet - low sodium heart healthy   Complete by: As directed    Diet Carb Modified   Complete by: As directed    Discharge instructions   Complete by: As directed    Follow with Primary MD  Harlan Stains, MD in 1-2 weeks  Electronic  referral has been sent to your neurologist's office-you should be hearing from them soon, if you do not hear from them in a week-please give them a call.  Please get a complete blood count and chemistry panel checked by your Primary MD at your next visit, and again as instructed by your Primary MD.  Get Medicines reviewed and adjusted: Please take all your medications with you for your next visit with your Primary MD  Laboratory/radiological data: Please request your Primary MD to go over all hospital tests and procedure/radiological results at the follow up, please ask your Primary MD to get all Hospital records sent to his/her office.  In some cases, they will be blood work, cultures and biopsy results pending at the time of your discharge. Please request that your primary care M.D. follows up on these results.  Also Note the following: If you experience worsening of your admission symptoms, develop shortness of breath, life threatening emergency, suicidal or homicidal thoughts you must seek medical attention immediately by calling 911 or calling your MD immediately  if symptoms less severe.  You must read complete instructions/literature along with all the possible adverse reactions/side effects for all the Medicines you take and that have been prescribed to you. Take any new Medicines after you have completely understood and accpet all the possible adverse reactions/side effects.   Do not drive when taking Pain medications or sleeping medications (Benzodaizepines)  Do not take more than prescribed Pain, Sleep and Anxiety Medications. It is not advisable to combine anxiety,sleep and pain medications without talking with your primary care practitioner  Special Instructions: If you have smoked or chewed Tobacco  in the last 2 yrs please stop smoking, stop any regular Alcohol  and or any Recreational drug use.  Wear Seat belts while driving.  Please note: You were cared for by a hospitalist  during your hospital stay. Once you are discharged, your primary care physician will handle any further medical issues. Please note that NO REFILLS for any discharge medications will be authorized once you are discharged, as it is imperative that you return to your primary care physician (or establish a relationship with a primary care physician if you do not have one) for your post hospital discharge needs so that they can reassess your need for medications and monitor your lab values.   Check your CBGs before meals and at bedtime.   Increase activity slowly   Complete by: As directed       Allergies as of 12/01/2020       Reactions   Metformin And Related Nausea Only   Saxagliptin Nausea Only        Medication List     STOP taking these medications    Accu-Chek Softclix Lancets lancets   clopidogrel 75 MG tablet Commonly known as: PLAVIX   famotidine 20 MG tablet Commonly known as: Pepcid   glucose blood test strip Commonly known as: Accu-Chek Aviva   haloperidol 0.5 MG tablet Commonly known as: HALDOL   insulin glargine-yfgn 100 UNIT/ML injection Commonly known as: SEMGLEE   melatonin 5 MG Tabs   metoprolol succinate 25 MG 24 hr tablet Commonly known as: TOPROL-XL   OLANZapine  zydis 5 MG disintegrating tablet Commonly known as: ZYPREXA   VITAMIN D3 PO       TAKE these medications    amLODipine 2.5 MG tablet Commonly known as: NORVASC Take 1 tablet (2.5 mg total) by mouth daily. What changed:  medication strength how much to take Another medication with the same name was removed. Continue taking this medication, and follow the directions you see here.   aspirin 81 MG tablet Take 1 tablet (81 mg total) by mouth daily.   atorvastatin 40 MG tablet Commonly known as: LIPITOR Take 1 tablet (40 mg total) by mouth daily. What changed: when to take this   blood glucose meter kit and supplies Dispense based on patient and insurance preference. Use up to  four times daily as directed. (FOR ICD-10 E10.9, E11.9).   calcium carbonate 1500 (600 Ca) MG Tabs tablet Commonly known as: OSCAL Take 1 tablet (1,500 mg total) by mouth daily with breakfast.   esomeprazole 40 MG capsule Commonly known as: NEXIUM Take 1 capsule (40 mg total) by mouth daily. What changed: Another medication with the same name was removed. Continue taking this medication, and follow the directions you see here.   Fish Oil 1000 MG Caps Take 1 capsule (1,000 mg total) by mouth daily.   insulin aspart 100 UNIT/ML injection Commonly known as: NovoLOG Inject 0-10 Units into the skin as directed. 0-9 Units, Subcutaneous, 3 times daily with meals: 0-69 implement hypoglycemic measures, 70-150=0 units, 151-199=2 units, 200-249=4 units, 250-299=6 units, 300-349=8 units, 350-399=10 units, [phone] Notify MD What changed: additional instructions   insulin glargine 100 UNIT/ML Solostar Pen Commonly known as: LANTUS Inject 12 Units into the skin daily.   Insulin Pen Needle 32G X 4 MM Misc 1 each by Does not apply route as needed. What changed:  how much to take how to take this when to take this reasons to take this additional instructions   latanoprost 0.005 % ophthalmic solution Commonly known as: XALATAN Place 1 drop into both eyes every evening.   memantine 5 MG tablet Commonly known as: NAMENDA Take 1 tablet (5 mg total) by mouth daily.   multivitamin with minerals Tabs tablet Take 1 tablet by mouth daily.   vitamin B-12 1000 MCG tablet Commonly known as: CYANOCOBALAMIN Take 1 tablet (1,000 mcg total) by mouth daily.               Durable Medical Equipment  (From admission, onward)           Start     Ordered   11/30/20 0942  For home use only DME Hospital bed  Once       Question Answer Comment  Length of Need Lifetime   The above medical condition requires: Patient requires the ability to reposition immediately   Head must be elevated  greater than: 45 degrees   Bed type Semi-electric   Support Surface: Low Air loss Mattress      11/30/20 0944   11/30/20 0942  For home use only DME lightweight manual wheelchair with seat cushion  Once       Comments: Patient suffers from physical deconditioning which impairs their ability to perform daily activities like bathing, dressing, feeding, grooming, and toileting in the home.  A cane, crutch, or walker will not resolve  issue with performing activities of daily living. A wheelchair will allow patient to safely perform daily activities. Patient is not able to propel themselves in the home using a standard weight wheelchair  due to arm weakness, endurance, and general weakness. Patient can self propel in the lightweight wheelchair. Length of need Lifetime. Accessories: elevating leg rests (ELRs), wheel locks, extensions and anti-tippers.   11/30/20 0944   11/30/20 0942  For home use only DME wheelchair cushion (seat and back)  Once        11/30/20 0944            Follow-up Information     Harlan Stains, MD. Schedule an appointment as soon as possible for a visit in 1 week(s).   Specialty: Family Medicine Contact information: [address], [place] [address] [phone]         Penni Bombard, MD. Schedule an appointment as soon as possible for a visit in 4 week(s).   Specialties: Neurology, Radiology Contact information: [address] [place] [address] [phone]                Allergies  Allergen Reactions   Metformin And Related Nausea Only   Saxagliptin Nausea Only      Consultations:  None   Other Procedures/Studies: CT ANGIO HEAD NECK W WO CM  Result Date: 11/06/2020 CLINICAL DATA:  Ataxia, stroke suspected EXAM: CT ANGIOGRAPHY HEAD AND NECK TECHNIQUE: Multidetector CT imaging of the head and neck was performed using the standard protocol during bolus administration of intravenous contrast.  Multiplanar CT image reconstructions and MIPs were obtained to evaluate the vascular anatomy. Carotid stenosis measurements (when applicable) are obtained utilizing NASCET criteria, using the distal internal carotid diameter as the denominator. CONTRAST:  17mL OMNIPAQUE IOHEXOL 350 MG/ML SOLN COMPARISON:  CT Head 11/05/2020.  Same day MRI. FINDINGS: CT HEAD FINDINGS Brain: Known acute infarcts in the cerebellar vermis and left cerebellar hemisphere are not well characterized by CT. No acute hemorrhage or mass effect. Redemonstrated moderate to advanced chronic microvascular ischemic disease and atrophy with disproportionate right hippocampal volume loss. No hydrocephalus, midline shift, mass lesion, or extra-axial fluid collection. Vascular: See below. Skull: No acute fracture. Sinuses: Visualized sinuses are clear. Orbits: No acute findings. Review of the MIP images confirms the above findings CTA NECK FINDINGS Aortic arch: Great vessel origins are patent.  Atherosclerosis. Right carotid system: Atherosclerosis of the common carotid artery with approximately 40% stenosis distally. Mixed calcific and noncalcific atherosclerosis at the carotid bifurcation without greater than 50% stenosis relative to the distal vessel. Retropharyngeal course. Left carotid system: Atherosclerosis at the carotid bifurcation without greater than 50% stenosis relative to the distal vessel. Vertebral arteries: Right dominant. The left vertebral artery is small throughout its course. No evidence of significant (greater than 50%) stenosis. Skeleton: Moderate multilevel degenerative disc disease. Other neck: No acute abnormality. Upper chest: Mild dependent ground-glass opacities, likely atelectasis. Otherwise, clear visualized lung apices. Review of the MIP images confirms the above findings CTA HEAD FINDINGS Anterior circulation: Bilateral intracranial ICA calcific atherosclerosis with severe greater than left paraclinoid ICA stenosis.  Bilateral M1 MCAs are small but patent. No proximal M2 MCA occlusion. Small or absent right A1 ACA, likely congenital. Otherwise, patent ACAs. Posterior circulation: Small/non dominant left vertebral artery appears to largely terminate as PICA. Mild atherosclerotic narrowing of the dominant right intradural vertebral artery. The basilar artery is small with superimposed moderate atherosclerotic narrowing along its midportion. Both superior cerebellar arteries are small with nonvisualization of the mid and distal left superior cerebellar artery, likely stenotic or occluded. Fetal type PCA on the right with severe right P2 PCA stenosis. Venous sinuses: As  permitted by contrast timing, patent. Anatomic variants: As detailed above Review of the MIP images confirms the above findings IMPRESSION: CT head: 1. Known acute infarcts in the cerebellar vermis and left cerebellar hemisphere are not well characterized by CT. No acute hemorrhage or mass effect. 2. Redemonstrated moderate to advanced chronic microvascular ischemic disease and atrophy with disproportionate right hippocampal volume loss. CTA head: 1. Severe right greater than left paraclinoid ICA stenosis. 2. The superior cerebellar arteries are difficult to assess due to their small size with nonvisualization of the mid and distal left superior cerebellar artery, likely stenotic or occluded. 3. Small vertebrobasilar system with superimposed moderate narrowing of the mid basilar artery and severe right P2 PCA stenosis. CTA neck: 1. Approximately 40% stenosis of the distal right common carotid artery. 2. Bilateral carotid bifurcation atherosclerosis without greater than 50% stenosis. Electronically Signed   By: Margaretha Sheffield M.D.   On: 11/06/2020 13:27   DG Chest 2 View  Result Date: 11/05/2020 CLINICAL DATA:  Weakness, fall EXAM: CHEST - 2 VIEW COMPARISON:  09/25/2017 FINDINGS: Lungs are clear.  No pleural effusion or pneumothorax. The heart is normal in  size. Mild degenerative changes of the visualized thoracolumbar spine. IMPRESSION: Normal chest radiographs. Electronically Signed   By: Julian Hy M.D.   On: 11/05/2020 19:32   CT HEAD WO CONTRAST (5MM)  Result Date: 11/26/2020 CLINICAL DATA:  Mental status changes of unknown cause. Hyperglycemia. EXAM: CT HEAD WITHOUT CONTRAST TECHNIQUE: Contiguous axial images were obtained from the base of the skull through the vertex without intravenous contrast. COMPARISON:  11/05/2020 FINDINGS: Brain: Generalized age related atrophy. There chronic small-vessel ischemic changes throughout the cerebral hemispheric white matter. Small acute/subacute cerebellar infarctions shown by recent MRI are not visible by CT. No mass, hemorrhage, hydrocephalus or extra-axial collection. Vascular: There is atherosclerotic calcification of the major vessels at the base of the brain. Skull: Negative Sinuses/Orbits: Clear/normal Other: None IMPRESSION: No acute finding by CT. Generalized atrophy. Extensive chronic small-vessel ischemic changes of the cerebral hemispheric white matter. Electronically Signed   By: Nelson Chimes M.D.   On: 11/26/2020 13:48   CT HEAD WO CONTRAST (5MM)  Result Date: 11/05/2020 CLINICAL DATA:  Mental status change. EXAM: CT HEAD WITHOUT CONTRAST TECHNIQUE: Contiguous axial images were obtained from the base of the skull through the vertex without intravenous contrast. COMPARISON:  None. FINDINGS: Brain: No acute intracranial hemorrhage. No focal mass lesion. No CT evidence of acute infarction. No midline shift or mass effect. No hydrocephalus. Basilar cisterns are patent. There are periventricular and subcortical white matter hypodensities. Generalized cortical atrophy. Vascular: No hyperdense vessel or unexpected calcification. Skull: Normal. Negative for fracture or focal lesion. Sinuses/Orbits: Paranasal sinuses and mastoid air cells are clear. Orbits are clear. Other: None. IMPRESSION: 1. No acute  intracranial findings. 2. Chronic atrophy and periventricular white matter microvascular disease. Electronically Signed   By: Suzy Bouchard M.D.   On: 11/05/2020 19:33   MR BRAIN WO CONTRAST  Result Date: 11/27/2020 CLINICAL DATA:  Mental status change, unknown cause. EXAM: MRI HEAD WITHOUT CONTRAST TECHNIQUE: Multiplanar, multiecho pulse sequences of the brain and surrounding structures were obtained without intravenous contrast. COMPARISON:  Head CT 11/26/2020 and MRI 11/06/2020 FINDINGS: Brain: There is a small amount of residual signal abnormality at the site of the superior cerebellar infarcts on the prior MRI. No acute infarct, intracranial hemorrhage, mass, midline shift, or extra-axial fluid collection is identified. Patchy and confluent T2 hyperintensities in the cerebral white matter bilaterally are  unchanged and nonspecific but compatible with moderate chronic small vessel ischemic disease. Mild cerebral and cerebellar atrophy and asymmetrically severe right mesial temporal lobe volume loss are again noted. Vascular: Major intracranial vascular flow voids are preserved. Skull and upper cervical spine: Unremarkable bone marrow signal. Sinuses/Orbits: Bilateral cataract extraction. Paranasal sinuses and mastoid air cells are clear. Other: None. IMPRESSION: 1. No acute intracranial abnormality. 2. Moderate chronic small vessel ischemic disease. 3. Cerebral atrophy including asymmetrically severe right temporal lobe volume loss. Electronically Signed   By: Logan Bores M.D.   On: 11/27/2020 18:46   MR BRAIN WO CONTRAST  Result Date: 11/06/2020 CLINICAL DATA:  Increased lethargy, reported fall EXAM: MRI HEAD WITHOUT CONTRAST TECHNIQUE: Multiplanar, multiecho pulse sequences of the brain and surrounding structures were obtained without intravenous contrast. COMPARISON:  Noncontrast CT head obtained 1 day prior FINDINGS: Brain: There are small acute to early subacute infarcts in the cerebellar vermis  and left cerebellar hemisphere in the SCA distribution. There is no associated hemorrhage. There is no other acute infarct. There is no extra-axial fluid collection. There is moderate global parenchymal volume loss with disproportionate severe right hippocampal atrophy. There is moderate left hippocampal atrophy. There is commensurate ex vacuo dilatation of the ventricular system. There is extensive confluent FLAIR signal abnormality throughout the subcortical and periventricular white matter likely reflecting sequela of advanced chronic white matter microangiopathy. There is no mass lesion.  There is no midline shift. Vascular: Normal flow voids. Skull and upper cervical spine: Normal marrow signal. Sinuses/Orbits: The imaged paranasal sinuses are clear. Bilateral lens implants are in place. The globes and orbits are otherwise unremarkable. Other: None. IMPRESSION: 1. Small early subacute infarcts in the cerebellar vermis and left cerebellar hemisphere in the SCA distribution. 2. Global parenchymal volume loss with disproportionate severe right hippocampal atrophy. 3. Advanced chronic white matter microangiopathy. Electronically Signed   By: Valetta Mole M.D.   On: 11/06/2020 09:34   DG Chest Port 1 View  Result Date: 11/26/2020 CLINICAL DATA:  Altered mental status, hyperglycemia, possible sepsis. EXAM: PORTABLE CHEST 1 VIEW COMPARISON:  11/05/2020. FINDINGS: Patient is rotated. Trachea is midline. Heart size stable. Lungs are low in volume with minimal bibasilar subsegmental atelectasis. No pleural fluid. IMPRESSION: Low lung volumes with minimal left basilar atelectasis. Electronically Signed   By: Lorin Picket M.D.   On: 11/26/2020 13:40   EEG adult  Result Date: 11/27/2020 Lora Havens, MD     11/27/2020 11:00 AM Patient Name: Selina Tapper MRN: [id number] Epilepsy Attending: Lora Havens Referring Physician/Provider: Dr Oren Binet Date: 11/27/2020 Duration: 23.19 mins Patient  history: [age] year old female with altered mental status.  EEG to evaluate for seizures. Level of alertness: lethargic AEDs during EEG study: None Technical aspects: This EEG study was done with scalp electrodes positioned according to the 10-20 International system of electrode placement. Electrical activity was acquired at a sampling rate of $Remov'500Hz'AlJBxE$  and reviewed with a high frequency filter of $RemoveB'70Hz'XLhFXIFj$  and a low frequency filter of $RemoveB'1Hz'RueBVmru$ . EEG data were recorded continuously and digitally stored. Description: No clear posterior dominant rhythm was seen. EEG showed continuous generalized 3-5 hz theta- delta slowing, at times with triphasic morphology.  Hyperventilation and photic stimulation were not performed.   ABNORMALITY - Continuous slow, generalized IMPRESSION: This study is suggestive of moderate diffuse encephalopathy, nonspecific etiology but could be secondary to toxic-metabolic causes. No seizures or epileptiform discharges were seen throughout the recording. Lora Havens   EEG adult  Result Date: 11/08/2020  Lora Havens, MD     11/08/2020 11:26 AM Patient Name: Willene Holian MRN: [id number] Epilepsy Attending: Lora Havens Referring Physician/Provider: Hetty Blend, NP Date: 11/08/2020 Duration: 21.49 mins Patient history: 85 y.o. 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 Per daughter patient has had recent falls, AMS, word finding difficulty, staring spell and increased lethargy. EEG to evaluate for seizure. Level of alertness: Awake AEDs during EEG study: None Technical aspects: This EEG study was done with scalp electrodes positioned according to the 10-20 International system of electrode placement. Electrical activity was acquired at a sampling rate of $Remov'500Hz'UieJmq$  and reviewed with a high frequency filter of $RemoveB'70Hz'ILbZiisB$  and a low frequency filter of  $R'1Hz'My$ . EEG data were recorded continuously and digitally stored. Description: The posterior dominant rhythm consists of 7.5 Hz activity of moderate voltage (25-35 uV) seen predominantly in posterior head regions, symmetric and reactive to eye opening and eye closing. EEG showed continuous generalized 5 to 6 Hz theta as well as intermittent 2-$RemoveBeforeDEI'3Hz'hZxFunZDYWyNLHyx$  delta slowing. Hyperventilation and photic stimulation were not performed.   ABNORMALITY - Continuous slow, generalized IMPRESSION: This study is suggestive of mild to moderate diffuse encephalopathy, nonspecific etiology. No seizures or epileptiform discharges were seen throughout the recording. Lora Havens   ECHOCARDIOGRAM COMPLETE  Result Date: 11/07/2020    ECHOCARDIOGRAM REPORT   Patient Name:   GREY SCHLAUCH Date of Exam: 11/07/2020 Medical Rec #:  [id number]      Height:       62.0 in Accession #:    [id number]     Weight:       160.0 lb Date of Birth:  August 20, 1935      BSA:          1.739 m Patient Age:    82 years       BP:           147/85 mmHg Patient Gender: F              HR:           95 bpm. Exam Location:  Inpatient Procedure: 2D Echo, Cardiac Doppler and Color Doppler Indications:    Stroke I63.9  History:        Patient has prior history of Echocardiogram examinations, most                 recent 06/17/2017.  Sonographer:    Merrie Roof RDCS Referring Phys: [id number] [place]  1. Left ventricular ejection fraction, by estimation, is 60 to 65%. The left ventricle has normal function. The left ventricle has no regional wall motion abnormalities. There is moderate left ventricular hypertrophy. Left ventricular diastolic parameters are consistent with Grade I diastolic dysfunction (impaired relaxation).  2. Right ventricular systolic function is normal. The right ventricular size is normal.  3. The mitral valve is abnormal. Trivial mitral valve regurgitation. No evidence of mitral stenosis.  4. The aortic valve is tricuspid. There is mild  calcification of the aortic valve. Aortic valve regurgitation is not visualized. Mild aortic valve sclerosis is present, with no evidence of aortic valve stenosis.  5. The inferior vena cava is normal in size with greater than 50% respiratory variability, suggesting right atrial pressure of 3 mmHg. FINDINGS  Left Ventricle: Left ventricular ejection fraction, by estimation, is 60 to 65%. The left ventricle has normal function. The  left ventricle has no regional wall motion abnormalities. The left ventricular internal cavity size was normal in size. There is  moderate left ventricular hypertrophy. Left ventricular diastolic parameters are consistent with Grade I diastolic dysfunction (impaired relaxation). Right Ventricle: The right ventricular size is normal. No increase in right ventricular wall thickness. Right ventricular systolic function is normal. Left Atrium: Left atrial size was normal in size. Right Atrium: Right atrial size was normal in size. Pericardium: There is no evidence of pericardial effusion. Mitral Valve: The mitral valve is abnormal. There is mild thickening of the mitral valve leaflet(s). There is mild calcification of the mitral valve leaflet(s). Mild mitral annular calcification. Trivial mitral valve regurgitation. No evidence of mitral valve stenosis. Tricuspid Valve: The tricuspid valve is normal in structure. Tricuspid valve regurgitation is not demonstrated. No evidence of tricuspid stenosis. Aortic Valve: The aortic valve is tricuspid. There is mild calcification of the aortic valve. Aortic valve regurgitation is not visualized. Mild aortic valve sclerosis is present, with no evidence of aortic valve stenosis. Aortic valve mean gradient measures 5.0 mmHg. Aortic valve peak gradient measures 7.4 mmHg. Aortic valve area, by VTI measures 2.56 cm. Pulmonic Valve: The pulmonic valve was normal in structure. Pulmonic valve regurgitation is not visualized. No evidence of pulmonic stenosis.  Aorta: The aortic root is normal in size and structure. Venous: The inferior vena cava is normal in size with greater than 50% respiratory variability, suggesting right atrial pressure of 3 mmHg. IAS/Shunts: No atrial level shunt detected by color flow Doppler.  LEFT VENTRICLE PLAX 2D LVIDd:         3.50 cm  Diastology LVIDs:         2.30 cm  LV e' medial:    4.90 cm/s LV PW:         1.30 cm  LV E/e' medial:  15.1 LV IVS:        1.40 cm  LV e' lateral:   6.31 cm/s LVOT diam:     2.00 cm  LV E/e' lateral: 11.7 LV SV:         69 LV SV Index:   40 LVOT Area:     3.14 cm  RIGHT VENTRICLE RV Basal diam:  2.80 cm LEFT ATRIUM             Index       RIGHT ATRIUM           Index LA diam:        3.20 cm 1.84 cm/m  RA Area:     14.30 cm LA Vol (A2C):   62.1 ml 35.72 ml/m RA Volume:   34.20 ml  19.67 ml/m LA Vol (A4C):   45.7 ml 26.28 ml/m LA Biplane Vol: 56.0 ml 32.21 ml/m  AORTIC VALVE AV Area (Vmax):    2.66 cm AV Area (Vmean):   2.44 cm AV Area (VTI):     2.56 cm AV Vmax:           136.00 cm/s AV Vmean:          102.000 cm/s AV VTI:            0.271 m AV Peak Grad:      7.4 mmHg AV Mean Grad:      5.0 mmHg LVOT Vmax:         115.00 cm/s LVOT Vmean:        79.300 cm/s LVOT VTI:          0.221 m  LVOT/AV VTI ratio: 0.82  AORTA Ao Root diam: 3.50 cm Ao Asc diam:  3.40 cm MITRAL VALVE MV Area (PHT): 4.06 cm     SHUNTS MV Decel Time: 187 msec     Systemic VTI:  0.22 m MV E velocity: 74.10 cm/s   Systemic Diam: 2.00 cm MV A velocity: 114.00 cm/s MV E/A ratio:  0.65 Berringer Rouge MD Electronically signed by Funderburk Rouge MD Signature Date/Time: 11/07/2020/9:45:48 AM    Final      TODAY-DAY OF DISCHARGE:  Subjective:   Kavin Leech today has no headache,no chest abdominal pain,no new weakness tingling or numbness, feels much better wants to go home today.   Objective:   Blood pressure (!) 138/49, pulse 89, temperature 98.3 F (36.8 C), temperature source Axillary, resp. rate 14, height $RemoveBe'5\' 2"'[id number]$  (1.575 m), weight  72.6 kg, SpO2 100 %. No intake or output data in the 24 hours ending 12/01/20 1051 Filed Weights   11/26/20 1215  Weight: 72.6 kg    Exam: Awake Alert,  No new F.N deficits, Normal affect [place].AT,PERRAL Supple Neck,No JVD, No cervical lymphadenopathy appriciated.  Symmetrical Chest wall movement, Good air movement bilaterally, CTAB RRR,No Gallops,Rubs or new Murmurs, No Parasternal Heave +ve B.Sounds, Abd Soft, Non tender, No organomegaly appriciated, No rebound -guarding or rigidity. No Cyanosis, Clubbing or edema, No new Rash or bruise   PERTINENT RADIOLOGIC STUDIES: No results found.   PERTINENT LAB RESULTS: CBC: Recent Labs    11/30/20 0233  WBC 8.6  HGB 10.5*  HCT 33.5*  PLT 110*   CMET CMP     Component Value Date/Time   NA 142 11/30/2020 0233   K 3.5 11/30/2020 0233   CL 112 (H) 11/30/2020 0233   CO2 26 11/30/2020 0233   GLUCOSE 241 (H) 11/30/2020 0233   BUN 26 (H) 11/30/2020 0233   CREATININE 1.16 (H) 11/30/2020 0233   CALCIUM 8.5 (L) 11/30/2020 0233   PROT 7.2 11/26/2020 1214   ALBUMIN 3.7 11/26/2020 1214   AST 16 11/26/2020 1214   ALT 20 11/26/2020 1214   ALKPHOS 65 11/26/2020 1214   BILITOT 1.0 11/26/2020 1214   GFRNONAA 47 (L) 11/30/2020 0233   GFRAA >60 05/18/2019 1435    GFR Estimated Creatinine Clearance: 34.3 mL/min (A) (by C-G formula based on SCr of 1.16 mg/dL (H)). No results for input(s): LIPASE, AMYLASE in the last 72 hours. No results for input(s): CKTOTAL, CKMB, CKMBINDEX, TROPONINI in the last 72 hours. Invalid input(s): POCBNP No results for input(s): DDIMER in the last 72 hours. No results for input(s): HGBA1C in the last 72 hours. No results for input(s): CHOL, HDL, LDLCALC, TRIG, CHOLHDL, LDLDIRECT in the last 72 hours. No results for input(s): TSH, T4TOTAL, T3FREE, THYROIDAB in the last 72 hours.  Invalid input(s): FREET3 No results for input(s): VITAMINB12, FOLATE, FERRITIN, TIBC, IRON, RETICCTPCT in the last 72  hours. Coags: No results for input(s): INR in the last 72 hours.  Invalid input(s): PT Microbiology: Recent Results (from the past 240 hour(s))  Resp Panel by RT-PCR (Flu A&B, Covid) Nasopharyngeal Swab     Status: None   Collection Time: 11/26/20 12:14 PM   Specimen: Nasopharyngeal Swab; Nasopharyngeal(NP) swabs in vial transport medium  Result Value Ref Range Status   SARS Coronavirus 2 by RT PCR NEGATIVE NEGATIVE Final    Comment: (NOTE) SARS-CoV-2 target nucleic acids are NOT DETECTED.  The SARS-CoV-2 RNA is generally detectable in upper respiratory specimens during the acute phase of infection. The lowest  concentration of SARS-CoV-2 viral copies this assay can detect is 138 copies/mL. A negative result does not preclude SARS-Cov-2 infection and should not be used as the sole basis for treatment or other patient management decisions. A negative result may occur with  improper specimen collection/handling, submission of specimen other than nasopharyngeal swab, presence of viral mutation(s) within the areas targeted by this assay, and inadequate number of viral copies(<138 copies/mL). A negative result must be combined with clinical observations, patient history, and epidemiological information. The expected result is Negative.  Fact Sheet for Patients:  EntrepreneurPulse.com.au  Fact Sheet for Healthcare Providers:  IncredibleEmployment.be  This test is no t yet approved or cleared by the [place] FDA and  has been authorized for detection and/or diagnosis of SARS-CoV-2 by FDA under an Emergency Use Authorization (EUA). This EUA will remain  in effect (meaning this test can be used) for the duration of the COVID-19 declaration under Section 564(b)(1) of the Act, 21 U.S.C.section 360bbb-3(b)(1), unless the authorization is terminated  or revoked sooner.       Influenza A by PCR NEGATIVE NEGATIVE Final   Influenza B by PCR NEGATIVE  NEGATIVE Final    Comment: (NOTE) The Xpert Xpress SARS-CoV-2/FLU/RSV plus assay is intended as an aid in the diagnosis of influenza from Nasopharyngeal swab specimens and should not be used as a sole basis for treatment. Nasal washings and aspirates are unacceptable for Xpert Xpress SARS-CoV-2/FLU/RSV testing.  Fact Sheet for Patients: EntrepreneurPulse.com.au  Fact Sheet for Healthcare Providers: IncredibleEmployment.be  This test is not yet approved or cleared by the [place] FDA and has been authorized for detection and/or diagnosis of SARS-CoV-2 by FDA under an Emergency Use Authorization (EUA). This EUA will remain in effect (meaning this test can be used) for the duration of the COVID-19 declaration under Section 564(b)(1) of the Act, 21 U.S.C. section 360bbb-3(b)(1), unless the authorization is terminated or revoked.  Performed at [facility], [place] [address], [place], [place] [address]   Blood culture (routine single)     Status: None (Preliminary result)   Collection Time: 11/26/20 12:29 PM   Specimen: BLOOD RIGHT FOREARM  Result Value Ref Range Status   Specimen Description BLOOD RIGHT FOREARM  Final   Special Requests   Final    BOTTLES DRAWN AEROBIC AND ANAEROBIC Blood Culture adequate volume   Culture   Final    NO GROWTH 4 DAYS Performed at [facility], [place] [address] [place], [place] [address]    Report Status PENDING  Incomplete  Urine Culture     Status: None   Collection Time: 11/26/20  1:37 PM   Specimen: In/Out Cath Urine  Result Value Ref Range Status   Specimen Description IN/OUT CATH URINE  Final   Special Requests NONE  Final   Culture   Final    NO GROWTH Performed at [facility], [place] [address], [place], [place] [address]    Report Status 11/27/2020 FINAL  Final    FURTHER DISCHARGE INSTRUCTIONS:  Get Medicines reviewed and adjusted: Please take all your medications with you  for your next visit with your Primary MD  Laboratory/radiological data: Please request your Primary MD to go over all hospital tests and procedure/radiological results at the follow up, please ask your Primary MD to get all Hospital records sent to his/her office.  In some cases, they will be blood work, cultures and biopsy results pending at the time of your discharge. Please request  that your primary care M.D. goes through all the records of your hospital data and follows up on these results.  Also Note the following: If you experience worsening of your admission symptoms, develop shortness of breath, life threatening emergency, suicidal or homicidal thoughts you must seek medical attention immediately by calling 911 or calling your MD immediately  if symptoms less severe.  You must read complete instructions/literature along with all the possible adverse reactions/side effects for all the Medicines you take and that have been prescribed to you. Take any new Medicines after you have completely understood and accpet all the possible adverse reactions/side effects.   Do not drive when taking Pain medications or sleeping medications (Benzodaizepines)  Do not take more than prescribed Pain, Sleep and Anxiety Medications. It is not advisable to combine anxiety,sleep and pain medications without talking with your primary care practitioner  Special Instructions: If you have smoked or chewed Tobacco  in the last 2 yrs please stop smoking, stop any regular Alcohol  and or any Recreational drug use.  Wear Seat belts while driving.  Please note: You were cared for by a hospitalist during your hospital stay. Once you are discharged, your primary care physician will handle any further medical issues. Please note that NO REFILLS for any discharge medications will be authorized once you are discharged, as it is imperative that you return to your primary care physician (or establish a relationship with a  primary care physician if you do not have one) for your post hospital discharge needs so that they can reassess your need for medications and monitor your lab values.  Total Time spent coordinating discharge including counseling, education and face to face time equals 35 minutes.  SignedOren Binet 12/01/2020 10:51 AM

## 2020-12-01 NOTE — TOC Transition Note (Addendum)
Transition of Care [facility]) - CM/SW Discharge Note   Patient Details  Name: Stacey Fuentes MRN: [id number] Date of Birth: [birth year]  [facility] CM/SW Contact:  Carles Collet, RN Phone Number: 12/01/2020, 11:22 AM   Clinical Narrative:    Damaris Schooner w daughter [name] in conjuction with Dr Sloan Leiter to review DC plan. Patient will DC to home w [facility] services through [facility]). Liaison, Angie, notified of DC planned for today. Reviewed w [name] that increasing aid hours provided through Medicaid is a process that is completed in conjuction with the PCP, reviewed typical hours of [place] services that will be provided by [facility]; visits several times a week, for around an hour each.  [name] states that she would like the hospital bed delivered to the house before the patient is [place] home. Agreed that is appropriate. Spoke w Freda Munro at [employer] to confirm delivery of bed today and to ensure that delivery is not delayed. I will check back w Freda Munro throughout today to verify delivery. [name] instructed to call nurse's station when bed delivered to home. Other DME ordered will also be delivered to the home. [name] confirms that the patient will need transportation. Verified address and PT[facility] forms placed on chart. PTAR called for pickup at 5pm, time can be adjusted based on bed delivery. Updated Financial controller and bedside nurse.   14:10 Attempt to reach daughter [name] twice, no answer. LVM requesting callback. Spoke w Freda Munro of [employer] who states that the driver went out to the house once this morning with the DME, the family refused it and requested him to come back today at 3pm. Driver will try to deliver equipment again today at 3pm. Driver is also having a hard time reaching [place] on the phone. Contacted Douglas, patient's son in law, and informed him that bed will be delivered at 3pm, and ambulance is scheduled to pick up patient around 5pm. He states that he will make sure [name] knows to  accept delivery of equipment at 3pm, and understands that patient will be picked up around 5pm from he hospital  16:00 Spoke w Douglass, bed not delivered yet. He said they are at home waiting for it. Notified [employer] that bed still not delivered. Called PT[facility] and delayed pickup till 6pm. Instructed patient's nurse Nadine to call Douglass at 5:30 to verify delivery of bed before PTAR comes, and cancel PTAR if bed will not be delivered tonight.   16:30 Notified by [employer] that delivery of bed will be between 5:30 and 6. Requested [employer] to call Douglass since [name] is not answering her phone for anyone.  I called [facility] to request pick up at 7pm. Instructed nurse to call Douglas at 6:30 to ensure bed delivery before PT[facility] shows up.   16:36 Received call from Douglas who states that she has called back every person who has called her today, states DME company has not called her, was quite aggressive over the phone. Informed that I would not argue with her and asked her to answer calls going forward to facilitate delivery of DME and informed her that DME should be delivered between 5:30 and 6. Provided with number for Barnett Applebaum at [phone] as a back up to [place], and was asked to share w [employer]. I had to terminate the call due to her aggression.     Final next level of care: [place] Barriers to Discharge: Continued Medical Work up, Other (must enter comment) (insurance auth)   Patient Goals and  CMS Choice   CMS Medicare.gov Compare Post Acute Care list provided to::  (Pt will return to [facility])    Discharge Placement                       Discharge Plan and [place] Choice: [place]          DME Arranged: Hospital bed, Wheelchair manual DME Agency: [employer] Date DME Agency Contacted: 11/30/20 Time DME Agency Contacted: [id number] Representative spoke with at DME Agency: Freda Munro HH Arranged: PT, OT, Speech Therapy, Nurse's Aide [place] Agency:  [place] Date [place]: 11/30/20 Time [employer]: 1455 Representative spoke with at [place]: [place] Determinants of Health ([place]) Interventions     Readmission Risk Interventions No flowsheet data found.

## 2020-12-01 NOTE — Progress Notes (Signed)
12/01/2020 Call daughter at 2122 to go over the AVS. Daughter is aware patient did not take her medication this morning. First shift RN pass message to the third shift nurse to see if he can give the morning mediation she did not want to today. Rico Sheehan RN

## 2020-12-02 DIAGNOSIS — E785 Hyperlipidemia, unspecified: Secondary | ICD-10-CM | POA: Diagnosis not present

## 2020-12-02 DIAGNOSIS — G9341 Metabolic encephalopathy: Secondary | ICD-10-CM | POA: Diagnosis not present

## 2020-12-02 DIAGNOSIS — M810 Age-related osteoporosis without current pathological fracture: Secondary | ICD-10-CM | POA: Diagnosis not present

## 2020-12-02 DIAGNOSIS — I1310 Hypertensive heart and chronic kidney disease without heart failure, with stage 1 through stage 4 chronic kidney disease, or unspecified chronic kidney disease: Secondary | ICD-10-CM | POA: Diagnosis not present

## 2020-12-02 DIAGNOSIS — N179 Acute kidney failure, unspecified: Secondary | ICD-10-CM | POA: Diagnosis not present

## 2020-12-02 DIAGNOSIS — F02818 Dementia in other diseases classified elsewhere, unspecified severity, with other behavioral disturbance: Secondary | ICD-10-CM | POA: Diagnosis not present

## 2020-12-02 DIAGNOSIS — E538 Deficiency of other specified B group vitamins: Secondary | ICD-10-CM | POA: Diagnosis not present

## 2020-12-02 DIAGNOSIS — E1165 Type 2 diabetes mellitus with hyperglycemia: Secondary | ICD-10-CM | POA: Diagnosis not present

## 2020-12-02 DIAGNOSIS — G250 Essential tremor: Secondary | ICD-10-CM | POA: Diagnosis not present

## 2020-12-02 DIAGNOSIS — N182 Chronic kidney disease, stage 2 (mild): Secondary | ICD-10-CM | POA: Diagnosis not present

## 2020-12-02 DIAGNOSIS — E1122 Type 2 diabetes mellitus with diabetic chronic kidney disease: Secondary | ICD-10-CM | POA: Diagnosis not present

## 2020-12-03 ENCOUNTER — Emergency Department (HOSPITAL_COMMUNITY): Payer: Medicare Other

## 2020-12-03 ENCOUNTER — Emergency Department (HOSPITAL_COMMUNITY)
Admission: EM | Admit: 2020-12-03 | Discharge: 2020-12-03 | Disposition: A | Discharge status: Home / Self Care | Payer: Medicare Other | Attending: Emergency Medicine | Admitting: Emergency Medicine

## 2020-12-03 DIAGNOSIS — R109 Unspecified abdominal pain: Secondary | ICD-10-CM | POA: Insufficient documentation

## 2020-12-03 DIAGNOSIS — I129 Hypertensive chronic kidney disease with stage 1 through stage 4 chronic kidney disease, or unspecified chronic kidney disease: Secondary | ICD-10-CM | POA: Insufficient documentation

## 2020-12-03 DIAGNOSIS — I10 Essential (primary) hypertension: Secondary | ICD-10-CM | POA: Diagnosis not present

## 2020-12-03 DIAGNOSIS — R339 Retention of urine, unspecified: Secondary | ICD-10-CM | POA: Diagnosis not present

## 2020-12-03 DIAGNOSIS — R739 Hyperglycemia, unspecified: Secondary | ICD-10-CM | POA: Diagnosis not present

## 2020-12-03 DIAGNOSIS — M25552 Pain in left hip: Secondary | ICD-10-CM | POA: Diagnosis not present

## 2020-12-03 DIAGNOSIS — E1165 Type 2 diabetes mellitus with hyperglycemia: Secondary | ICD-10-CM | POA: Insufficient documentation

## 2020-12-03 DIAGNOSIS — I700 Atherosclerosis of aorta: Secondary | ICD-10-CM | POA: Diagnosis not present

## 2020-12-03 DIAGNOSIS — N320 Bladder-neck obstruction: Secondary | ICD-10-CM | POA: Diagnosis not present

## 2020-12-03 DIAGNOSIS — Z743 Need for continuous supervision: Secondary | ICD-10-CM | POA: Diagnosis not present

## 2020-12-03 DIAGNOSIS — E1121 Type 2 diabetes mellitus with diabetic nephropathy: Secondary | ICD-10-CM | POA: Diagnosis not present

## 2020-12-03 DIAGNOSIS — G9341 Metabolic encephalopathy: Secondary | ICD-10-CM | POA: Diagnosis not present

## 2020-12-03 DIAGNOSIS — Z79899 Other long term (current) drug therapy: Secondary | ICD-10-CM | POA: Insufficient documentation

## 2020-12-03 DIAGNOSIS — Z7982 Long term (current) use of aspirin: Secondary | ICD-10-CM | POA: Diagnosis not present

## 2020-12-03 DIAGNOSIS — M4856XA Collapsed vertebra, not elsewhere classified, lumbar region, initial encounter for fracture: Secondary | ICD-10-CM | POA: Diagnosis not present

## 2020-12-03 DIAGNOSIS — E1122 Type 2 diabetes mellitus with diabetic chronic kidney disease: Secondary | ICD-10-CM | POA: Diagnosis not present

## 2020-12-03 DIAGNOSIS — Y900 Blood alcohol level of less than 20 mg/100 ml: Secondary | ICD-10-CM | POA: Diagnosis not present

## 2020-12-03 DIAGNOSIS — F0390 Unspecified dementia without behavioral disturbance: Secondary | ICD-10-CM | POA: Insufficient documentation

## 2020-12-03 DIAGNOSIS — N183 Chronic kidney disease, stage 3 unspecified: Secondary | ICD-10-CM | POA: Diagnosis not present

## 2020-12-03 DIAGNOSIS — Z794 Long term (current) use of insulin: Secondary | ICD-10-CM | POA: Diagnosis not present

## 2020-12-03 DIAGNOSIS — R29818 Other symptoms and signs involving the nervous system: Secondary | ICD-10-CM | POA: Diagnosis not present

## 2020-12-03 DIAGNOSIS — R531 Weakness: Secondary | ICD-10-CM | POA: Diagnosis not present

## 2020-12-03 DIAGNOSIS — R4182 Altered mental status, unspecified: Secondary | ICD-10-CM | POA: Diagnosis not present

## 2020-12-03 DIAGNOSIS — R404 Transient alteration of awareness: Secondary | ICD-10-CM | POA: Diagnosis not present

## 2020-12-03 DIAGNOSIS — R0902 Hypoxemia: Secondary | ICD-10-CM | POA: Diagnosis not present

## 2020-12-03 LAB — COMPREHENSIVE METABOLIC PANEL
ALT: 38 U/L (ref 0–44)
AST: 58 U/L — ABNORMAL HIGH (ref 15–41)
Albumin: 2.4 g/dL — ABNORMAL LOW (ref 3.5–5.0)
Alkaline Phosphatase: 66 U/L (ref 38–126)
Anion gap: 7 (ref 5–15)
BUN: 18 mg/dL (ref 8–23)
CO2: 23 mmol/L (ref 22–32)
Calcium: 8.4 mg/dL — ABNORMAL LOW (ref 8.9–10.3)
Chloride: 107 mmol/L (ref 98–111)
Creatinine, Ser: 1.12 mg/dL — ABNORMAL HIGH (ref 0.44–1.00)
GFR, Estimated: 49 mL/min — ABNORMAL LOW (ref >60)
Glucose, Bld: 334 mg/dL — ABNORMAL HIGH (ref 70–99)
Potassium: 4.2 mmol/L (ref 3.5–5.1)
Sodium: 137 mmol/L (ref 135–145)
Total Bilirubin: 0.6 mg/dL (ref 0.3–1.2)
Total Protein: 5.2 g/dL — ABNORMAL LOW (ref 6.5–8.1)

## 2020-12-03 LAB — CBC WITH DIFFERENTIAL/PLATELET
Abs Immature Granulocytes: 0.03 10*3/uL (ref 0.00–0.07)
Basophils Absolute: 0 10*3/uL (ref 0.0–0.1)
Basophils Relative: 1 %
Eosinophils Absolute: 0.3 10*3/uL (ref 0.0–0.5)
Eosinophils Relative: 5 %
HCT: 36.5 % (ref 36.0–46.0)
Hemoglobin: 11.4 g/dL — ABNORMAL LOW (ref 12.0–15.0)
Immature Granulocytes: 1 %
Lymphocytes Relative: 29 %
Lymphs Abs: 1.8 10*3/uL (ref 0.7–4.0)
MCH: 30.9 pg (ref 26.0–34.0)
MCHC: 31.2 g/dL (ref 30.0–36.0)
MCV: 98.9 fL (ref 80.0–100.0)
Monocytes Absolute: 0.6 10*3/uL (ref 0.1–1.0)
Monocytes Relative: 9 %
Neutro Abs: 3.4 10*3/uL (ref 1.7–7.7)
Neutrophils Relative %: 55 %
Platelets: 137 10*3/uL — ABNORMAL LOW (ref 150–400)
RBC: 3.69 MIL/uL — ABNORMAL LOW (ref 3.87–5.11)
RDW: 12.6 % (ref 11.5–15.5)
WBC: 6.1 10*3/uL (ref 4.0–10.5)
nRBC: 0 % (ref 0.0–0.2)

## 2020-12-03 LAB — RAPID URINE DRUG SCREEN, HOSP PERFORMED
Amphetamines: NOT DETECTED
Barbiturates: NOT DETECTED
Benzodiazepines: NOT DETECTED
Cocaine: NOT DETECTED
Opiates: NOT DETECTED
Tetrahydrocannabinol: NOT DETECTED

## 2020-12-03 LAB — URINALYSIS, ROUTINE W REFLEX MICROSCOPIC
Bilirubin Urine: NEGATIVE
Glucose, UA: 150 mg/dL — AB
Hgb urine dipstick: NEGATIVE
Ketones, ur: NEGATIVE mg/dL
Leukocytes,Ua: NEGATIVE
Nitrite: NEGATIVE
Protein, ur: NEGATIVE mg/dL
Specific Gravity, Urine: 1.016 (ref 1.005–1.030)
pH: 5 (ref 5.0–8.0)

## 2020-12-03 LAB — CBG MONITORING, ED
Glucose-Capillary: 302 mg/dL — ABNORMAL HIGH (ref 70–99)
Glucose-Capillary: 276 mg/dL — ABNORMAL HIGH (ref 70–99)
Glucose-Capillary: 122 mg/dL — ABNORMAL HIGH (ref 70–99)

## 2020-12-03 LAB — I-STAT VENOUS BLOOD GAS, ED
Acid-Base Excess: 2 mmol/L (ref 0.0–2.0)
Bicarbonate: 24.6 mmol/L (ref 20.0–28.0)
Calcium, Ion: 1.11 mmol/L — ABNORMAL LOW (ref 1.15–1.40)
HCT: 35 % — ABNORMAL LOW (ref 36.0–46.0)
Hemoglobin: 11.9 g/dL — ABNORMAL LOW (ref 12.0–15.0)
O2 Saturation: 98 %
Potassium: 4.3 mmol/L (ref 3.5–5.1)
Sodium: 139 mmol/L (ref 135–145)
TCO2: 26 mmol/L (ref 22–32)
pCO2, Ven: 32.6 mmHg — ABNORMAL LOW (ref 44.0–60.0)
pH, Ven: 7.486 — ABNORMAL HIGH (ref 7.250–7.430)
pO2, Ven: 99 mmHg — ABNORMAL HIGH (ref 32.0–45.0)

## 2020-12-03 LAB — BETA-HYDROXYBUTYRIC ACID
Beta-Hydroxybutyric Acid: 0.07 mmol/L (ref 0.05–0.27)
Beta-Hydroxybutyric Acid: 0.17 mmol/L (ref 0.05–0.27)

## 2020-12-03 LAB — PROTIME-INR
INR: 1 (ref 0.8–1.2)
Prothrombin Time: 12.8 seconds (ref 11.4–15.2)

## 2020-12-03 LAB — ETHANOL: Alcohol, Ethyl (B): <10 mg/dL (ref <10)

## 2020-12-03 LAB — AMMONIA: Ammonia: 26 umol/L (ref 9–35)

## 2020-12-03 MED ORDER — LACTATED RINGERS IV BOLUS
20.0000 mL/kg | Freq: Once | INTRAVENOUS | Status: AC
  Administered 2020-12-03: 1452 mL via INTRAVENOUS

## 2020-12-03 MED ORDER — INSULIN ASPART 100 UNIT/ML IJ SOLN: 0.0000 [IU] | INTRAMUSCULAR | 2 refills | Status: AC

## 2020-12-03 MED ORDER — INSULIN ASPART 100 UNIT/ML IJ SOLN
4.0000 [IU] | Freq: Once | INTRAMUSCULAR | Status: AC
  Administered 2020-12-03: 4 [IU] via INTRAVENOUS

## 2020-12-03 MED ORDER — BLOOD GLUCOSE METER KIT: PACK | 0 refills | Status: AC

## 2020-12-03 NOTE — ED Provider Notes (Signed)
Patient signed out to me is pending urinalysis and results of CT imaging.  CT imaging is unremarkable for acute findings.  Patient does have urine retention, Foley catheter has been placed.  Recommending outpatient follow-up with urology within the week.  Recommending return if she has fevers worsening pain or any additional concerns.    Luna Fuse, MD 12/03/20 1000

## 2020-12-03 NOTE — Discharge Instructions (Signed)
Continue medications as previously prescribed.  Add sliding scale insulin as prescribed this evening.  Follow-up with your primary doctor later this week, and return to the ER if symptoms significantly worsen or change.

## 2020-12-03 NOTE — ED Notes (Signed)
Pt has returned from MRI. 

## 2020-12-03 NOTE — ED Notes (Signed)
Patient transported to MRI 

## 2020-12-03 NOTE — ED Notes (Signed)
Cbg 302

## 2020-12-03 NOTE — ED Notes (Signed)
Patient transported to XRAY 

## 2020-12-03 NOTE — ED Provider Notes (Signed)
[facility] EMERGENCY DEPARTMENT Provider Note   CSN: [id number] Arrival date & time: 12/03/20  0152     History Chief Complaint  Patient presents with   Hyperglycemia   Altered Mental Status    Stacey Fuentes is a 85 y.o. female.  Patient is an 85 year old female with past medical history of diabetes, chronic renal insufficiency, recent CVA.  Patient brought by EMS for evaluation of altered mental status, weakness, and elevated blood sugar.  She recently suffered a stroke for which she was hospitalized, then sent to a rehab facility.  While there, she developed altered mental status, then required admission for diabetic ketoacidosis and metabolic encephalopathy.  Upon discharge for this subsequent admission, family decided to take her home and care for her themselves.  This evening, she became less responsive, more confused, and seemed weaker than normal.  Family checked the blood sugar and it was has high as 400.  Family gave an additional dose of insulin, then EMS was called and she was transported here.  Patient adds little additional history secondary to baseline dementia.  Most history taken from the daughter who was present at bedside.  The history is provided by the patient.      Past Medical History:  Diagnosis Date   Benign essential tremor    Cataract    CKD (chronic kidney disease), stage II    Diabetes mellitus ([place])    type2   Full dentures    Hyperlipidemia    Memory difficulty    Osteoporosis    Uncontrolled diabetes mellitus type 2 without complications 8/88/2800   Vitamin D deficiency    Wears glasses     Patient Active Problem List   Diagnosis Date Noted   DKA (diabetic ketoacidosis) ([place]) 11/26/2020   Cerebellar infarct ([place]) 11/06/2020   Dementia without behavioral disturbance ([place]) 11/06/2020   Type 2 diabetes mellitus with other specified complication ([place]) 34/91/7915   CKD (chronic kidney disease), stage III ([place]) 11/06/2020    Essential hypertension 07/12/2017   Orthostatic syncope 06/11/2017   Aortic ejection murmur 06/11/2017   Uncontrolled type 2 diabetes mellitus with nephropathy ([place]) 03/24/2014   Upper abdominal pain 03/23/2014   Nausea 03/23/2014   Abnormal findings on radiological examination of gastrointestinal tract 03/23/2014   Infected sebaceous cyst 04/15/2013    Past Surgical History:  Procedure Laterality Date   APPENDECTOMY     childhood   CATARACT EXTRACTION, BILATERAL  2014   COLONOSCOPY     in [place] in [place] N/A 05/18/2013   Procedure: EXCISION OF SEBACEOUS CYST BACK;  Surgeon: Merrie Roof, MD;  Location: [facility];  Service: General;  Laterality: N/A;   surgery to remove cyst on back  2010   x 2   TRANSTHORACIC ECHOCARDIOGRAM  06/17/2017    EF 60-65%.  Normal wall motion.  GR 1 DD.  No obvious valvular lesions.   TUBAL LIGATION       OB History   No obstetric history on file.     Family History  Problem Relation Age of Onset   Tuberculosis Mother    Other Father        homicide   Colon cancer Neg Hx    Esophageal cancer Neg Hx    Rectal cancer Neg Hx    Stomach cancer Neg Hx     Social History   Tobacco Use   Smoking status: Never   Smokeless tobacco: Never  Vaping Use   Vaping Use: Never used  Substance Use Topics   Alcohol use: No   Drug use: No    Home Medications Prior to Admission medications   Medication Sig Start Date End Date Taking? Authorizing Provider  amLODipine (NORVASC) 2.5 MG tablet Take 1 tablet (2.5 mg total) by mouth daily. 12/01/20   Ghimire, Henreitta Leber, MD  aspirin 81 MG tablet Take 1 tablet (81 mg total) by mouth daily. 12/01/20   Ghimire, Henreitta Leber, MD  atorvastatin (LIPITOR) 40 MG tablet Take 1 tablet (40 mg total) by mouth daily. 12/01/20   Ghimire, Henreitta Leber, MD  blood glucose meter kit and supplies Dispense based on patient and insurance preference. Use up to four times daily as  directed. (FOR ICD-10 E10.9, E11.9). 12/01/20   Ghimire, Henreitta Leber, MD  calcium carbonate (OSCAL) 1500 (600 Ca) MG TABS tablet Take 1 tablet (1,500 mg total) by mouth daily with breakfast. 12/01/20   Ghimire, Henreitta Leber, MD  esomeprazole (NEXIUM) 40 MG capsule Take 1 capsule (40 mg total) by mouth daily. 12/01/20   Ghimire, Henreitta Leber, MD  insulin aspart (NOVOLOG) 100 UNIT/ML injection Inject 0-10 Units into the skin as directed. 0-9 Units, Subcutaneous, 3 times daily with meals: 0-69 implement hypoglycemic measures, 70-150=0 units, 151-199=2 units, 200-249=4 units, 250-299=6 units, 300-349=8 units, 350-399=10 units, [phone] Notify MD 12/01/20   Ghimire, Henreitta Leber, MD  insulin glargine (LANTUS) 100 UNIT/ML Solostar Pen Inject 12 Units into the skin daily. 12/01/20   Ghimire, Henreitta Leber, MD  Insulin Pen Needle 32G X 4 MM MISC 1 each by Does not apply route as needed. 12/01/20   Ghimire, Henreitta Leber, MD  latanoprost (XALATAN) 0.005 % ophthalmic solution Place 1 drop into both eyes every evening. 12/01/20   Ghimire, Henreitta Leber, MD  memantine (NAMENDA) 5 MG tablet Take 1 tablet (5 mg total) by mouth daily. 12/01/20   Ghimire, Henreitta Leber, MD  Multiple Vitamin (MULTIVITAMIN WITH MINERALS) TABS tablet Take 1 tablet by mouth daily. 12/01/20   Ghimire, Henreitta Leber, MD  Omega-3 Fatty Acids (FISH OIL) 1000 MG CAPS Take 1 capsule (1,000 mg total) by mouth daily. 12/01/20   Ghimire, Henreitta Leber, MD  vitamin B-12 (CYANOCOBALAMIN) 1000 MCG tablet Take 1 tablet (1,000 mcg total) by mouth daily. 12/01/20   Ghimire, Henreitta Leber, MD    Allergies    Metformin and related and Saxagliptin  Review of Systems   Review of Systems  Unable to perform ROS: Mental status change   Physical Exam Updated Vital Signs BP 138/64 (BP Location: Right Arm)   Pulse 88   Temp 98.6 F (37 C) (Oral)   Resp 20   SpO2 95%   Physical Exam Vitals and nursing note reviewed.  Constitutional:      General: She is not in acute distress.     Appearance: She is well-developed. She is not diaphoretic.     Comments: Patient is an 85 year old female in no acute distress.  She is somnolent, but arousable.  She will answer questions and respond to commands appropriately, but does appear somewhat confused.  HENT:     Head: Normocephalic and atraumatic.  Eyes:     Extraocular Movements: Extraocular movements intact.     Pupils: Pupils are equal, round, and reactive to light.  Cardiovascular:     Rate and Rhythm: Normal rate and regular rhythm.     Heart sounds: No murmur heard.   No friction rub. No gallop.  Pulmonary:  Effort: Pulmonary effort is normal. No respiratory distress.     Breath sounds: Normal breath sounds. No wheezing.  Abdominal:     General: Bowel sounds are normal. There is no distension.     Palpations: Abdomen is soft.     Tenderness: There is no abdominal tenderness.  Musculoskeletal:        General: Normal range of motion.     Cervical back: Normal range of motion and neck supple.  Skin:    General: Skin is warm and dry.  Neurological:     Comments: Patient is somnolent, but arousable.  Strength is diminished throughout all extremities, but more notable in the right arm and right leg.    ED Results / Procedures / Treatments   Labs (all labs ordered are listed, but only abnormal results are displayed) Labs Reviewed  CBC WITH DIFFERENTIAL/PLATELET - Abnormal; Notable for the following components:      Result Value   RBC 3.69 (*)    Hemoglobin 11.4 (*)    Platelets 137 (*)    All other components within normal limits  CBG MONITORING, ED - Abnormal; Notable for the following components:   Glucose-Capillary 302 (*)    All other components within normal limits  I-STAT VENOUS BLOOD GAS, ED - Abnormal; Notable for the following components:   pH, Ven 7.486 (*)    pCO2, Ven 32.6 (*)    pO2, Ven 99.0 (*)    Calcium, Ion 1.11 (*)    HCT 35.0 (*)    Hemoglobin 11.9 (*)    All other components within  normal limits  PROTIME-INR  AMMONIA  BETA-HYDROXYBUTYRIC ACID  BETA-HYDROXYBUTYRIC ACID  BETA-HYDROXYBUTYRIC ACID  URINALYSIS, ROUTINE W REFLEX MICROSCOPIC  COMPREHENSIVE METABOLIC PANEL  RAPID URINE DRUG SCREEN, HOSP PERFORMED  ETHANOL  CBG MONITORING, ED  CBG MONITORING, ED    EKG EKG Interpretation  Date/Time:  Monday December 03 2020 02:03:34 EDT Ventricular Rate:  86 PR Interval:  144 QRS Duration: 87 QT Interval:  360 QTC Calculation: 431 R Axis:   63 Text Interpretation: Sinus rhythm Normal ECG Confirmed by Veryl Speak [phone]) on 12/03/2020 3:32:57 AM  Radiology CT HEAD WO CONTRAST (5MM)  Result Date: 12/03/2020 CLINICAL DATA:  Mental status change EXAM: CT HEAD WITHOUT CONTRAST TECHNIQUE: Contiguous axial images were obtained from the base of the skull through the vertex without intravenous contrast. COMPARISON:  11/26/2020 FINDINGS: Brain: No evidence of acute infarction, hemorrhage, hydrocephalus, extra-axial collection or mass lesion/mass effect. Age related atrophy. Calcifications in the basal ganglia. Periventricular white matter changes, likely the sequela of chronic small vessel ischemic disease. Vascular: No hyperdense vessel. Atherosclerotic calcifications in the intracranial carotid and vertebral arteries. Skull: No acute osseous abnormality. Sinuses/Orbits: Mucosal thickening in the ethmoid air cells. Status post bilateral lens replacements. Other: The mastoids are well aerated. IMPRESSION: No acute intracranial process. Electronically Signed   By: Merilyn Baba M.D.   On: 12/03/2020 03:17   DG Chest Portable 1 View  Result Date: 12/03/2020 CLINICAL DATA:  Altered mental status. EXAM: PORTABLE CHEST 1 VIEW COMPARISON:  Chest radiograph dated 11/26/2020. FINDINGS: No focal consolidation, pleural effusion, or pneumothorax. The cardiac silhouette is within limits. No acute osseous pathology. IMPRESSION: No active disease. Electronically Signed   By: Anner Crete M.D.   On: 12/03/2020 02:38    Procedures Procedures   Medications Ordered in ED Medications  lactated ringers bolus 1,452 mL (1,452 mLs Intravenous New Bag/Given 12/03/20 0243)    ED Course  I  have reviewed the triage vital signs and the nursing notes.  Pertinent labs & imaging results that were available during my care of the patient were reviewed by me and considered in my medical decision making (see chart for details).  Clinical Course as of 12/03/20 0327  Mon Dec 03, 2020  0218 Pt SPO2 dipping to 89% while I was in the room.  Placed on [place] @ 1L [HM]    Clinical Course User Index [HM] Muthersbaugh, Gwenlyn Perking   MDM Rules/Calculators/A&P  Patient is a an 85 year old female with history of dementia and recent CVA.  Patient presenting with complaints of somnolence and altered mental status.  She was recently admitted for DKA and blood sugars running high this evening at home.  Patient's work-up here shows initial blood sugar of 330, but no evidence for DKA.  The remainder of her laboratory studies are essentially unremarkable.  Head CT is negative and x-ray of the left hip is negative.  According to the daughter, her prior stroke did not show up on CAT scan and required MRI.  An MRI was obtained as well showing no evidence for acute stroke.  Patient has been hydrated here in the ER and has also received IV insulin.  Her sugar is now 122 and according to the daughter patient is more talkative and interactive and seems more herself.  I see no definitive indication for admission at this time.  Prior to being discharged, the patient's daughter stated that she had not urinated today.  We did obtain a bladder scan showing 650 in the bladder.  A straight cath will be performed and urinalysis collected.Marland Kitchen  Discharge anticipated pending results of urinalysis.  Final Clinical Impression(s) / ED Diagnoses Final diagnoses:  None    Rx / DC Orders ED Discharge Orders     None         Veryl Speak, MD 12/03/20 [phone]

## 2020-12-03 NOTE — ED Notes (Signed)
Pt transported to CT ?

## 2020-12-03 NOTE — ED Triage Notes (Signed)
Per EMS, pt from home, per family pt had a CBG of 301 and gave her long acting insulin.  At 1am they checked it again it was 400.  Pt does have hx of dementia however is alert to voice only.   20 G L hand, given 5oocc NS, no change 135/86

## 2020-12-03 NOTE — ED Notes (Signed)
Pts daughter reports that the patient has not voided since yesterday. Brief is dry. Pt bladder scanned. 650 ml. Dr. Stark Jock informed.

## 2020-12-07 DIAGNOSIS — E7800 Pure hypercholesterolemia, unspecified: Secondary | ICD-10-CM | POA: Diagnosis not present

## 2020-12-07 DIAGNOSIS — Z9842 Cataract extraction status, left eye: Secondary | ICD-10-CM | POA: Diagnosis not present

## 2020-12-07 DIAGNOSIS — E1122 Type 2 diabetes mellitus with diabetic chronic kidney disease: Secondary | ICD-10-CM | POA: Diagnosis not present

## 2020-12-07 DIAGNOSIS — Z8619 Personal history of other infectious and parasitic diseases: Secondary | ICD-10-CM | POA: Diagnosis not present

## 2020-12-07 DIAGNOSIS — Z86010 Personal history of colonic polyps: Secondary | ICD-10-CM | POA: Diagnosis not present

## 2020-12-07 DIAGNOSIS — N183 Chronic kidney disease, stage 3 unspecified: Secondary | ICD-10-CM | POA: Diagnosis not present

## 2020-12-07 DIAGNOSIS — M810 Age-related osteoporosis without current pathological fracture: Secondary | ICD-10-CM | POA: Diagnosis not present

## 2020-12-07 DIAGNOSIS — Z9841 Cataract extraction status, right eye: Secondary | ICD-10-CM | POA: Diagnosis not present

## 2020-12-07 DIAGNOSIS — K5900 Constipation, unspecified: Secondary | ICD-10-CM | POA: Diagnosis not present

## 2020-12-07 DIAGNOSIS — E440 Moderate protein-calorie malnutrition: Secondary | ICD-10-CM | POA: Diagnosis not present

## 2020-12-07 DIAGNOSIS — R609 Edema, unspecified: Secondary | ICD-10-CM | POA: Diagnosis not present

## 2020-12-07 DIAGNOSIS — G250 Essential tremor: Secondary | ICD-10-CM | POA: Diagnosis not present

## 2020-12-07 DIAGNOSIS — Z794 Long term (current) use of insulin: Secondary | ICD-10-CM | POA: Diagnosis not present

## 2020-12-07 DIAGNOSIS — Z9181 History of falling: Secondary | ICD-10-CM | POA: Diagnosis not present

## 2020-12-07 DIAGNOSIS — I129 Hypertensive chronic kidney disease with stage 1 through stage 4 chronic kidney disease, or unspecified chronic kidney disease: Secondary | ICD-10-CM | POA: Diagnosis not present

## 2020-12-08 ENCOUNTER — Encounter (HOSPITAL_COMMUNITY): Payer: Self-pay

## 2020-12-08 ENCOUNTER — Other Ambulatory Visit: Payer: Self-pay

## 2020-12-08 ENCOUNTER — Emergency Department (HOSPITAL_COMMUNITY)
Admission: EM | Admit: 2020-12-08 | Discharge: 2020-12-09 | Disposition: A | Discharge status: Home / Self Care | Payer: Medicare Other | Source: Home / Self Care

## 2020-12-08 DIAGNOSIS — R4182 Altered mental status, unspecified: Secondary | ICD-10-CM | POA: Diagnosis not present

## 2020-12-08 DIAGNOSIS — R630 Anorexia: Secondary | ICD-10-CM | POA: Insufficient documentation

## 2020-12-08 DIAGNOSIS — F0390 Unspecified dementia without behavioral disturbance: Secondary | ICD-10-CM | POA: Insufficient documentation

## 2020-12-08 DIAGNOSIS — M810 Age-related osteoporosis without current pathological fracture: Secondary | ICD-10-CM | POA: Diagnosis not present

## 2020-12-08 DIAGNOSIS — N1831 Chronic kidney disease, stage 3a: Secondary | ICD-10-CM | POA: Diagnosis not present

## 2020-12-08 DIAGNOSIS — E1165 Type 2 diabetes mellitus with hyperglycemia: Secondary | ICD-10-CM | POA: Diagnosis not present

## 2020-12-08 DIAGNOSIS — I129 Hypertensive chronic kidney disease with stage 1 through stage 4 chronic kidney disease, or unspecified chronic kidney disease: Secondary | ICD-10-CM | POA: Diagnosis not present

## 2020-12-08 DIAGNOSIS — Z823 Family history of stroke: Secondary | ICD-10-CM | POA: Diagnosis not present

## 2020-12-08 DIAGNOSIS — R7309 Other abnormal glucose: Secondary | ICD-10-CM | POA: Insufficient documentation

## 2020-12-08 DIAGNOSIS — R109 Unspecified abdominal pain: Secondary | ICD-10-CM | POA: Diagnosis not present

## 2020-12-08 DIAGNOSIS — R509 Fever, unspecified: Secondary | ICD-10-CM | POA: Diagnosis not present

## 2020-12-08 DIAGNOSIS — Z831 Family history of other infectious and parasitic diseases: Secondary | ICD-10-CM | POA: Diagnosis not present

## 2020-12-08 DIAGNOSIS — I69319 Unspecified symptoms and signs involving cognitive functions following cerebral infarction: Secondary | ICD-10-CM | POA: Diagnosis not present

## 2020-12-08 DIAGNOSIS — R404 Transient alteration of awareness: Secondary | ICD-10-CM | POA: Diagnosis not present

## 2020-12-08 DIAGNOSIS — E1122 Type 2 diabetes mellitus with diabetic chronic kidney disease: Secondary | ICD-10-CM | POA: Diagnosis not present

## 2020-12-08 DIAGNOSIS — R8290 Unspecified abnormal findings in urine: Secondary | ICD-10-CM | POA: Insufficient documentation

## 2020-12-08 DIAGNOSIS — D631 Anemia in chronic kidney disease: Secondary | ICD-10-CM | POA: Diagnosis not present

## 2020-12-08 DIAGNOSIS — R112 Nausea with vomiting, unspecified: Secondary | ICD-10-CM | POA: Diagnosis not present

## 2020-12-08 DIAGNOSIS — T83511A Infection and inflammatory reaction due to indwelling urethral catheter, initial encounter: Secondary | ICD-10-CM | POA: Diagnosis not present

## 2020-12-08 DIAGNOSIS — G9341 Metabolic encephalopathy: Secondary | ICD-10-CM | POA: Diagnosis not present

## 2020-12-08 DIAGNOSIS — Z5321 Procedure and treatment not carried out due to patient leaving prior to being seen by health care provider: Secondary | ICD-10-CM | POA: Insufficient documentation

## 2020-12-08 DIAGNOSIS — K219 Gastro-esophageal reflux disease without esophagitis: Secondary | ICD-10-CM | POA: Diagnosis not present

## 2020-12-08 DIAGNOSIS — E876 Hypokalemia: Secondary | ICD-10-CM | POA: Diagnosis not present

## 2020-12-08 DIAGNOSIS — M4316 Spondylolisthesis, lumbar region: Secondary | ICD-10-CM | POA: Diagnosis not present

## 2020-12-08 DIAGNOSIS — N390 Urinary tract infection, site not specified: Secondary | ICD-10-CM | POA: Diagnosis not present

## 2020-12-08 DIAGNOSIS — Z833 Family history of diabetes mellitus: Secondary | ICD-10-CM | POA: Diagnosis not present

## 2020-12-08 DIAGNOSIS — R110 Nausea: Secondary | ICD-10-CM | POA: Diagnosis not present

## 2020-12-08 DIAGNOSIS — N3289 Other specified disorders of bladder: Secondary | ICD-10-CM | POA: Diagnosis not present

## 2020-12-08 DIAGNOSIS — A4152 Sepsis due to Pseudomonas: Secondary | ICD-10-CM | POA: Diagnosis not present

## 2020-12-08 DIAGNOSIS — Z20822 Contact with and (suspected) exposure to covid-19: Secondary | ICD-10-CM | POA: Diagnosis not present

## 2020-12-08 DIAGNOSIS — E785 Hyperlipidemia, unspecified: Secondary | ICD-10-CM | POA: Diagnosis not present

## 2020-12-08 DIAGNOSIS — R6520 Severe sepsis without septic shock: Secondary | ICD-10-CM | POA: Diagnosis not present

## 2020-12-08 DIAGNOSIS — I69359 Hemiplegia and hemiparesis following cerebral infarction affecting unspecified side: Secondary | ICD-10-CM | POA: Diagnosis not present

## 2020-12-08 DIAGNOSIS — Z743 Need for continuous supervision: Secondary | ICD-10-CM | POA: Diagnosis not present

## 2020-12-08 DIAGNOSIS — K920 Hematemesis: Secondary | ICD-10-CM | POA: Diagnosis not present

## 2020-12-08 DIAGNOSIS — K449 Diaphragmatic hernia without obstruction or gangrene: Secondary | ICD-10-CM | POA: Diagnosis not present

## 2020-12-08 DIAGNOSIS — J9811 Atelectasis: Secondary | ICD-10-CM | POA: Diagnosis not present

## 2020-12-08 DIAGNOSIS — R319 Hematuria, unspecified: Secondary | ICD-10-CM | POA: Diagnosis not present

## 2020-12-08 LAB — COMPREHENSIVE METABOLIC PANEL
ALT: 24 U/L (ref 0–44)
AST: 26 U/L (ref 15–41)
Albumin: 2.6 g/dL — ABNORMAL LOW (ref 3.5–5.0)
Alkaline Phosphatase: 60 U/L (ref 38–126)
Anion gap: 5 (ref 5–15)
BUN: 10 mg/dL (ref 8–23)
CO2: 24 mmol/L (ref 22–32)
Calcium: 8.9 mg/dL (ref 8.9–10.3)
Chloride: 105 mmol/L (ref 98–111)
Creatinine, Ser: 0.99 mg/dL (ref 0.44–1.00)
GFR, Estimated: 57 mL/min — ABNORMAL LOW (ref >60)
Glucose, Bld: 228 mg/dL — ABNORMAL HIGH (ref 70–99)
Potassium: 4.5 mmol/L (ref 3.5–5.1)
Sodium: 134 mmol/L — ABNORMAL LOW (ref 135–145)
Total Bilirubin: 0.5 mg/dL (ref 0.3–1.2)
Total Protein: 5.6 g/dL — ABNORMAL LOW (ref 6.5–8.1)

## 2020-12-08 LAB — URINALYSIS, ROUTINE W REFLEX MICROSCOPIC
Bilirubin Urine: NEGATIVE
Glucose, UA: NEGATIVE mg/dL
Ketones, ur: 5 mg/dL — AB
Nitrite: NEGATIVE
Protein, ur: 100 mg/dL — AB
RBC / HPF: >50 RBC/hpf — ABNORMAL HIGH (ref 0–5)
Specific Gravity, Urine: 1.018 (ref 1.005–1.030)
WBC, UA: >50 WBC/hpf — ABNORMAL HIGH (ref 0–5)
pH: 6 (ref 5.0–8.0)

## 2020-12-08 LAB — CBC WITH DIFFERENTIAL/PLATELET
Abs Immature Granulocytes: 0.03 10*3/uL (ref 0.00–0.07)
Basophils Absolute: 0 10*3/uL (ref 0.0–0.1)
Basophils Relative: 0 %
Eosinophils Absolute: 0.1 10*3/uL (ref 0.0–0.5)
Eosinophils Relative: 1 %
HCT: 34.8 % — ABNORMAL LOW (ref 36.0–46.0)
Hemoglobin: 11.6 g/dL — ABNORMAL LOW (ref 12.0–15.0)
Immature Granulocytes: 0 %
Lymphocytes Relative: 14 %
Lymphs Abs: 1.2 10*3/uL (ref 0.7–4.0)
MCH: 31.8 pg (ref 26.0–34.0)
MCHC: 33.3 g/dL (ref 30.0–36.0)
MCV: 95.3 fL (ref 80.0–100.0)
Monocytes Absolute: 0.4 10*3/uL (ref 0.1–1.0)
Monocytes Relative: 5 %
Neutro Abs: 6.9 10*3/uL (ref 1.7–7.7)
Neutrophils Relative %: 80 %
Platelets: 207 10*3/uL (ref 150–400)
RBC: 3.65 MIL/uL — ABNORMAL LOW (ref 3.87–5.11)
RDW: 12.5 % (ref 11.5–15.5)
WBC: 8.7 10*3/uL (ref 4.0–10.5)
nRBC: 0 % (ref 0.0–0.2)

## 2020-12-08 LAB — CBG MONITORING, ED: Glucose-Capillary: 221 mg/dL — ABNORMAL HIGH (ref 70–99)

## 2020-12-08 LAB — LIPASE, BLOOD: Lipase: 20 U/L (ref 11–51)

## 2020-12-08 NOTE — ED Triage Notes (Addendum)
Patient arrived by [facility] from home for AMS and blood in foley. Patient discharged from hospital this past Monday following admission for Stroke. Patient decreased appetite. Patient also diagnosed with dementia. Non-ambulatory at home CBG 211 Patient alert and denies pain, cloudy urine noted in foley

## 2020-12-08 NOTE — ED Notes (Signed)
Patient family here to sit with patient

## 2020-12-08 NOTE — ED Provider Notes (Signed)
Emergency Medicine Provider Triage Evaluation Note  Stacey Fuentes , a 85 y.o. female  was evaluated in triage.  Per daughters patient has had a catheter for 4 days, and blood in the foley for about 3 days.  She was seen 4 days ago.  She has been vomiting.  Family reports that they have been giving her colace, enemas, stool softeners, mineral oil, epsom salts and she has not had a bm since two weeks and is complaining of abdominal pain.   Family reports that it seems like she is having episodes of severe abdominal pain.  She hasn't had significant persistant mental status change since her MRI 4 days ago, rather has episodes of abdominal pain.  Review of Systems  Positive: Abdominal pain, constipation Negative: fevers  Physical Exam  BP 100/76   Pulse 93   Temp 99.3 F (37.4 C) (Oral)   Resp 14   SpO2 100%  Gen:   Awake, no distress, abdomen with involuntary guarding of lower abdomen on palpation. Foley urine is clear. Resp:  Normal effort  MSK:   No obvious acute deformities.  Other:  Patient answers simple questions.   Medical Decision Making  Medically screening exam initiated at 7:43 PM.  Appropriate orders placed.  Stacey Fuentes was informed that the remainder of the evaluation will be completed by another provider, this initial triage assessment does not replace that evaluation, and the importance of remaining in the ED until their evaluation is complete.     Lorin Glass, PA-C 12/08/20 2000    Tegeler, Stacey Allegra, MD 12/08/20 2021

## 2020-12-09 ENCOUNTER — Encounter (HOSPITAL_COMMUNITY): Payer: Self-pay | Admitting: Emergency Medicine

## 2020-12-09 ENCOUNTER — Emergency Department (HOSPITAL_COMMUNITY): Payer: Medicare Other

## 2020-12-09 ENCOUNTER — Inpatient Hospital Stay (HOSPITAL_COMMUNITY)
Admission: EM | Admit: 2020-12-09 | Discharge: 2020-12-20 | DRG: 698 | Disposition: A | Discharge status: Home Health | Payer: Medicare Other | Attending: Internal Medicine | Admitting: Internal Medicine

## 2020-12-09 ENCOUNTER — Other Ambulatory Visit: Payer: Self-pay

## 2020-12-09 DIAGNOSIS — T83511A Infection and inflammatory reaction due to indwelling urethral catheter, initial encounter: Principal | ICD-10-CM | POA: Diagnosis present

## 2020-12-09 DIAGNOSIS — N390 Urinary tract infection, site not specified: Secondary | ICD-10-CM | POA: Diagnosis not present

## 2020-12-09 DIAGNOSIS — N1831 Chronic kidney disease, stage 3a: Secondary | ICD-10-CM | POA: Diagnosis present

## 2020-12-09 DIAGNOSIS — Y846 Urinary catheterization as the cause of abnormal reaction of the patient, or of later complication, without mention of misadventure at the time of the procedure: Secondary | ICD-10-CM | POA: Diagnosis present

## 2020-12-09 DIAGNOSIS — N3289 Other specified disorders of bladder: Secondary | ICD-10-CM | POA: Diagnosis not present

## 2020-12-09 DIAGNOSIS — R0902 Hypoxemia: Secondary | ICD-10-CM | POA: Diagnosis not present

## 2020-12-09 DIAGNOSIS — E1165 Type 2 diabetes mellitus with hyperglycemia: Secondary | ICD-10-CM | POA: Diagnosis present

## 2020-12-09 DIAGNOSIS — M810 Age-related osteoporosis without current pathological fracture: Secondary | ICD-10-CM | POA: Diagnosis present

## 2020-12-09 DIAGNOSIS — Z6826 Body mass index (BMI) 26.0-26.9, adult: Secondary | ICD-10-CM

## 2020-12-09 DIAGNOSIS — E1110 Type 2 diabetes mellitus with ketoacidosis without coma: Secondary | ICD-10-CM | POA: Diagnosis not present

## 2020-12-09 DIAGNOSIS — K920 Hematemesis: Secondary | ICD-10-CM | POA: Diagnosis not present

## 2020-12-09 DIAGNOSIS — Z743 Need for continuous supervision: Secondary | ICD-10-CM | POA: Diagnosis not present

## 2020-12-09 DIAGNOSIS — E1122 Type 2 diabetes mellitus with diabetic chronic kidney disease: Secondary | ICD-10-CM | POA: Diagnosis present

## 2020-12-09 DIAGNOSIS — Z7401 Bed confinement status: Secondary | ICD-10-CM | POA: Diagnosis not present

## 2020-12-09 DIAGNOSIS — J9811 Atelectasis: Secondary | ICD-10-CM | POA: Diagnosis not present

## 2020-12-09 DIAGNOSIS — Z515 Encounter for palliative care: Secondary | ICD-10-CM | POA: Diagnosis not present

## 2020-12-09 DIAGNOSIS — F0150 Vascular dementia without behavioral disturbance: Secondary | ICD-10-CM | POA: Diagnosis present

## 2020-12-09 DIAGNOSIS — R410 Disorientation, unspecified: Secondary | ICD-10-CM

## 2020-12-09 DIAGNOSIS — A419 Sepsis, unspecified organism: Secondary | ICD-10-CM | POA: Diagnosis not present

## 2020-12-09 DIAGNOSIS — I639 Cerebral infarction, unspecified: Secondary | ICD-10-CM | POA: Diagnosis not present

## 2020-12-09 DIAGNOSIS — Z794 Long term (current) use of insulin: Secondary | ICD-10-CM | POA: Diagnosis not present

## 2020-12-09 DIAGNOSIS — K449 Diaphragmatic hernia without obstruction or gangrene: Secondary | ICD-10-CM | POA: Diagnosis not present

## 2020-12-09 DIAGNOSIS — Z833 Family history of diabetes mellitus: Secondary | ICD-10-CM

## 2020-12-09 DIAGNOSIS — G459 Transient cerebral ischemic attack, unspecified: Secondary | ICD-10-CM | POA: Diagnosis not present

## 2020-12-09 DIAGNOSIS — I10 Essential (primary) hypertension: Secondary | ICD-10-CM | POA: Diagnosis present

## 2020-12-09 DIAGNOSIS — I69319 Unspecified symptoms and signs involving cognitive functions following cerebral infarction: Secondary | ICD-10-CM

## 2020-12-09 DIAGNOSIS — R4182 Altered mental status, unspecified: Secondary | ICD-10-CM | POA: Diagnosis present

## 2020-12-09 DIAGNOSIS — Z831 Family history of other infectious and parasitic diseases: Secondary | ICD-10-CM | POA: Diagnosis not present

## 2020-12-09 DIAGNOSIS — Z823 Family history of stroke: Secondary | ICD-10-CM

## 2020-12-09 DIAGNOSIS — R6520 Severe sepsis without septic shock: Secondary | ICD-10-CM | POA: Diagnosis present

## 2020-12-09 DIAGNOSIS — R319 Hematuria, unspecified: Secondary | ICD-10-CM | POA: Diagnosis present

## 2020-12-09 DIAGNOSIS — Z888 Allergy status to other drugs, medicaments and biological substances status: Secondary | ICD-10-CM

## 2020-12-09 DIAGNOSIS — R1110 Vomiting, unspecified: Secondary | ICD-10-CM | POA: Diagnosis not present

## 2020-12-09 DIAGNOSIS — G9341 Metabolic encephalopathy: Secondary | ICD-10-CM | POA: Diagnosis present

## 2020-12-09 DIAGNOSIS — A4152 Sepsis due to Pseudomonas: Secondary | ICD-10-CM | POA: Diagnosis present

## 2020-12-09 DIAGNOSIS — E1169 Type 2 diabetes mellitus with other specified complication: Secondary | ICD-10-CM | POA: Diagnosis not present

## 2020-12-09 DIAGNOSIS — M4316 Spondylolisthesis, lumbar region: Secondary | ICD-10-CM | POA: Diagnosis not present

## 2020-12-09 DIAGNOSIS — F0390 Unspecified dementia without behavioral disturbance: Secondary | ICD-10-CM | POA: Diagnosis not present

## 2020-12-09 DIAGNOSIS — Z20822 Contact with and (suspected) exposure to covid-19: Secondary | ICD-10-CM | POA: Diagnosis present

## 2020-12-09 DIAGNOSIS — D631 Anemia in chronic kidney disease: Secondary | ICD-10-CM | POA: Diagnosis present

## 2020-12-09 DIAGNOSIS — K5900 Constipation, unspecified: Secondary | ICD-10-CM | POA: Diagnosis not present

## 2020-12-09 DIAGNOSIS — R509 Fever, unspecified: Secondary | ICD-10-CM | POA: Diagnosis not present

## 2020-12-09 DIAGNOSIS — E860 Dehydration: Secondary | ICD-10-CM | POA: Diagnosis not present

## 2020-12-09 DIAGNOSIS — R6889 Other general symptoms and signs: Secondary | ICD-10-CM | POA: Diagnosis not present

## 2020-12-09 DIAGNOSIS — E785 Hyperlipidemia, unspecified: Secondary | ICD-10-CM | POA: Diagnosis present

## 2020-12-09 DIAGNOSIS — I499 Cardiac arrhythmia, unspecified: Secondary | ICD-10-CM | POA: Diagnosis not present

## 2020-12-09 DIAGNOSIS — R112 Nausea with vomiting, unspecified: Secondary | ICD-10-CM | POA: Diagnosis not present

## 2020-12-09 DIAGNOSIS — K219 Gastro-esophageal reflux disease without esophagitis: Secondary | ICD-10-CM | POA: Diagnosis not present

## 2020-12-09 DIAGNOSIS — E876 Hypokalemia: Secondary | ICD-10-CM | POA: Diagnosis not present

## 2020-12-09 DIAGNOSIS — I69359 Hemiplegia and hemiparesis following cerebral infarction affecting unspecified side: Secondary | ICD-10-CM | POA: Diagnosis not present

## 2020-12-09 DIAGNOSIS — N183 Chronic kidney disease, stage 3 unspecified: Secondary | ICD-10-CM | POA: Diagnosis not present

## 2020-12-09 DIAGNOSIS — Z7982 Long term (current) use of aspirin: Secondary | ICD-10-CM

## 2020-12-09 DIAGNOSIS — Z79899 Other long term (current) drug therapy: Secondary | ICD-10-CM

## 2020-12-09 DIAGNOSIS — I129 Hypertensive chronic kidney disease with stage 1 through stage 4 chronic kidney disease, or unspecified chronic kidney disease: Secondary | ICD-10-CM | POA: Diagnosis present

## 2020-12-09 HISTORY — DX: Essential (primary) hypertension: I10

## 2020-12-09 HISTORY — DX: Unspecified dementia, unspecified severity, without behavioral disturbance, psychotic disturbance, mood disturbance, and anxiety: F03.90

## 2020-12-09 LAB — COMPREHENSIVE METABOLIC PANEL
ALT: 25 U/L (ref 0–44)
AST: 25 U/L (ref 15–41)
Albumin: 3.3 g/dL — ABNORMAL LOW (ref 3.5–5.0)
Alkaline Phosphatase: 65 U/L (ref 38–126)
Anion gap: 7 (ref 5–15)
BUN: 14 mg/dL (ref 8–23)
CO2: 28 mmol/L (ref 22–32)
Calcium: 9.2 mg/dL (ref 8.9–10.3)
Chloride: 102 mmol/L (ref 98–111)
Creatinine, Ser: 0.99 mg/dL (ref 0.44–1.00)
GFR, Estimated: 57 mL/min — ABNORMAL LOW (ref >60)
Glucose, Bld: 214 mg/dL — ABNORMAL HIGH (ref 70–99)
Potassium: 4.7 mmol/L (ref 3.5–5.1)
Sodium: 137 mmol/L (ref 135–145)
Total Bilirubin: 1 mg/dL (ref 0.3–1.2)
Total Protein: 6.5 g/dL (ref 6.5–8.1)

## 2020-12-09 LAB — CBC WITH DIFFERENTIAL/PLATELET
Abs Immature Granulocytes: 0.03 10*3/uL (ref 0.00–0.07)
Basophils Absolute: 0.1 10*3/uL (ref 0.0–0.1)
Basophils Relative: 1 %
Eosinophils Absolute: 0.2 10*3/uL (ref 0.0–0.5)
Eosinophils Relative: 2 %
HCT: 37.7 % (ref 36.0–46.0)
Hemoglobin: 12.2 g/dL (ref 12.0–15.0)
Immature Granulocytes: 0 %
Lymphocytes Relative: 13 %
Lymphs Abs: 1.2 10*3/uL (ref 0.7–4.0)
MCH: 31.2 pg (ref 26.0–34.0)
MCHC: 32.4 g/dL (ref 30.0–36.0)
MCV: 96.4 fL (ref 80.0–100.0)
Monocytes Absolute: 0.6 10*3/uL (ref 0.1–1.0)
Monocytes Relative: 6 %
Neutro Abs: 7.1 10*3/uL (ref 1.7–7.7)
Neutrophils Relative %: 78 %
Platelets: 246 10*3/uL (ref 150–400)
RBC: 3.91 MIL/uL (ref 3.87–5.11)
RDW: 12.7 % (ref 11.5–15.5)
WBC: 9.1 10*3/uL (ref 4.0–10.5)
nRBC: 0 % (ref 0.0–0.2)

## 2020-12-09 LAB — URINALYSIS, ROUTINE W REFLEX MICROSCOPIC
Bilirubin Urine: NEGATIVE
Glucose, UA: 50 mg/dL — AB
Ketones, ur: 20 mg/dL — AB
Nitrite: NEGATIVE
Protein, ur: NEGATIVE mg/dL
Specific Gravity, Urine: 1.015 (ref 1.005–1.030)
WBC, UA: >50 WBC/hpf — ABNORMAL HIGH (ref 0–5)
pH: 5 (ref 5.0–8.0)

## 2020-12-09 LAB — LACTIC ACID, PLASMA: Lactic Acid, Venous: 1.4 mmol/L (ref 0.5–1.9)

## 2020-12-09 MED ORDER — ONDANSETRON HCL 4 MG/2ML IJ SOLN: 4.0000 mg | Freq: Four times a day (QID) | INTRAMUSCULAR | Status: DC | PRN

## 2020-12-09 MED ORDER — INSULIN ASPART 100 UNIT/ML IJ SOLN
0.0000 [IU] | Freq: Three times a day (TID) | INTRAMUSCULAR | Status: DC
  Administered 2020-12-10: 2 [IU] via SUBCUTANEOUS
  Administered 2020-12-10: 5 [IU] via SUBCUTANEOUS
  Administered 2020-12-10: 2 [IU] via SUBCUTANEOUS
  Administered 2020-12-11 (×2): 3 [IU] via SUBCUTANEOUS
  Administered 2020-12-12 (×2): 1 [IU] via SUBCUTANEOUS
  Administered 2020-12-13: 2 [IU] via SUBCUTANEOUS
  Administered 2020-12-13: 1 [IU] via SUBCUTANEOUS
  Administered 2020-12-15: 3 [IU] via SUBCUTANEOUS
  Administered 2020-12-16 – 2020-12-17 (×2): 2 [IU] via SUBCUTANEOUS
  Administered 2020-12-17: 3 [IU] via SUBCUTANEOUS
  Administered 2020-12-17: 2 [IU] via SUBCUTANEOUS
  Administered 2020-12-18: 1 [IU] via SUBCUTANEOUS
  Administered 2020-12-18 (×2): 3 [IU] via SUBCUTANEOUS
  Administered 2020-12-19 – 2020-12-20 (×3): 7 [IU] via SUBCUTANEOUS
  Administered 2020-12-20: 5 [IU] via SUBCUTANEOUS
  Filled 2020-12-09: qty 0.09

## 2020-12-09 MED ORDER — ONDANSETRON HCL 4 MG/2ML IJ SOLN: 4.0000 mg | Freq: Once | INTRAMUSCULAR | Status: AC

## 2020-12-09 MED ORDER — LORAZEPAM 2 MG/ML IJ SOLN
1.0000 mg | Freq: Once | INTRAMUSCULAR | Status: AC
  Administered 2020-12-09: 1 mg via INTRAVENOUS
  Filled 2020-12-09: qty 1

## 2020-12-09 MED ORDER — IOHEXOL 350 MG/ML SOLN
75.0000 mL | Freq: Once | INTRAVENOUS | Status: AC | PRN
  Administered 2020-12-09: 75 mL via INTRAVENOUS

## 2020-12-09 MED ORDER — SODIUM CHLORIDE 0.9 % IV BOLUS
1000.0000 mL | Freq: Once | INTRAVENOUS | Status: AC
  Administered 2020-12-09: 1000 mL via INTRAVENOUS

## 2020-12-09 MED ORDER — SODIUM CHLORIDE 0.9 % IV SOLN: INTRAVENOUS | Status: DC

## 2020-12-09 MED ORDER — ONDANSETRON HCL 4 MG/2ML IJ SOLN
INTRAMUSCULAR | Status: AC
  Administered 2020-12-09: 4 mg via INTRAVENOUS
  Filled 2020-12-09: qty 2

## 2020-12-09 MED ORDER — INSULIN ASPART 100 UNIT/ML IJ SOLN
0.0000 [IU] | Freq: Every day | INTRAMUSCULAR | Status: DC
  Administered 2020-12-10: 2 [IU] via SUBCUTANEOUS
  Filled 2020-12-09: qty 0.05

## 2020-12-09 MED ORDER — ENOXAPARIN SODIUM 40 MG/0.4ML IJ SOSY
40.0000 mg | PREFILLED_SYRINGE | INTRAMUSCULAR | Status: DC
  Administered 2020-12-10 (×2): 40 mg via SUBCUTANEOUS
  Filled 2020-12-09 (×2): qty 0.4

## 2020-12-09 MED ORDER — SODIUM CHLORIDE 0.9 % IV SOLN
2.0000 g | INTRAVENOUS | Status: DC
  Administered 2020-12-10: 2 g via INTRAVENOUS
  Filled 2020-12-09: qty 20

## 2020-12-09 MED ORDER — SODIUM CHLORIDE 0.9 % IV SOLN
1.0000 g | Freq: Once | INTRAVENOUS | Status: DC
  Filled 2020-12-09: qty 10

## 2020-12-09 NOTE — ED Notes (Signed)
Pt returned from CT °

## 2020-12-09 NOTE — H&P (Addendum)
History and Physical  Stacey Fuentes XBM:[id number] DOB: [birth year] DOA: 12/09/2020  Referring physician: Dr. Vanita Panda, [place]  PCP: Harlan Stains, MD  Outpatient Specialists: Neurology. Patient coming from: Home.  Chief Complaint: Altered mental status and dark urine.  HPI: Stacey Fuentes is a 85 y.o. female with medical history significant for dementia, type 2 diabetes, hyperlipidemia, recent CVA who presented to the [facility] ED from home due to worsening mental status, abdominal pain, nausea and vomiting x1 day.  History is mainly obtained from her daughter at bedside.  Symptoms started acutely on 12/09/2020.  Patient has been complaining of abdominal pain at home.  Subjective temperature at home 98.6.  Upon presentation to the ED, patient is obtunded, CT head showed no evidence of acute intracranial abnormality.  CT abdomen and pelvis with contrast showing urinary bladder findings suggestive of infection.  Small hiatal hernia.  No bowel obstruction.  No constipation.  UA positive for pyuria, urine culture was sent.  Patient was started on Rocephin empirically in the ED.  TRH, hospitalist team, was asked to admit.  ED Course: T-max 99.6.  BP 147/75, pulse 119, respiration rate 23, O2 saturation 100% on room air.  Lab studies remarkable for serum glucose 214, creatinine 0.99, GFR temperature.  Albumin 3.3.  Review of Systems: Review of systems as noted in the HPI. All other systems reviewed and are negative.   Past Medical History:  Diagnosis Date   Benign essential tremor    Cataract    CKD (chronic kidney disease), stage II    Diabetes mellitus ([place])    type2   Full dentures    Hyperlipidemia    Memory difficulty    Osteoporosis    Uncontrolled diabetes mellitus type 2 without complications 0/27/2536   Vitamin D deficiency    Wears glasses    Past Surgical History:  Procedure Laterality Date   APPENDECTOMY     childhood   CATARACT EXTRACTION, BILATERAL  2014   COLONOSCOPY     in  [place] in [place] N/A 05/18/2013   Procedure: EXCISION OF SEBACEOUS CYST BACK;  Surgeon: Merrie Roof, MD;  Location: [place];  Service: General;  Laterality: N/A;   surgery to remove cyst on back  2010   x 2   TRANSTHORACIC ECHOCARDIOGRAM  06/17/2017    EF 60-65%.  Normal wall motion.  GR 1 DD.  No obvious valvular lesions.   TUBAL LIGATION      Social History:  reports that she has never smoked. She has never used smokeless tobacco. She reports that she does not drink alcohol and does not use drugs.   Allergies  Allergen Reactions   Metformin And Related Nausea Only   Saxagliptin Nausea Only    Family History  Problem Relation Age of Onset   Tuberculosis Mother    Other Father        homicide   Colon cancer Neg Hx    Esophageal cancer Neg Hx    Rectal cancer Neg Hx    Stomach cancer Neg Hx       Prior to Admission medications   Medication Sig Start Date End Date Taking? Authorizing Provider  amLODipine (NORVASC) 2.5 MG tablet Take 1 tablet (2.5 mg total) by mouth daily. 12/01/20   Ghimire, Henreitta Leber, MD  aspirin 81 MG tablet Take 1 tablet (81 mg total) by mouth daily. 12/01/20   Ghimire, Henreitta Leber, MD  atorvastatin (LIPITOR) 40 MG tablet Take  1 tablet (40 mg total) by mouth daily. 12/01/20   Ghimire, Henreitta Leber, MD  blood glucose meter kit and supplies Dispense based on patient and insurance preference. Use up to four times daily as directed. (FOR ICD-10 E10.9, E11.9). 12/03/20   Veryl Speak, MD  calcium carbonate (OSCAL) 1500 (600 Ca) MG TABS tablet Take 1 tablet (1,500 mg total) by mouth daily with breakfast. 12/01/20   Ghimire, Henreitta Leber, MD  esomeprazole (NEXIUM) 40 MG capsule Take 1 capsule (40 mg total) by mouth daily. 12/01/20   Ghimire, Henreitta Leber, MD  insulin aspart (NOVOLOG) 100 UNIT/ML injection Inject 0-10 Units into the skin as directed. 0-9 Units, Subcutaneous, 3 times daily with meals: 0-69 implement hypoglycemic  measures, 70-150=0 units, 151-199=2 units, 200-249=4 units, 250-299=6 units, 300-349=8 units, 350-399=10 units, [phone] Notify MD 12/03/20   Veryl Speak, MD  insulin glargine (LANTUS) 100 UNIT/ML Solostar Pen Inject 12 Units into the skin daily. 12/01/20   Ghimire, Henreitta Leber, MD  Insulin Pen Needle 32G X 4 MM MISC 1 each by Does not apply route as needed. 12/01/20   Ghimire, Henreitta Leber, MD  latanoprost (XALATAN) 0.005 % ophthalmic solution Place 1 drop into both eyes every evening. 12/01/20   Ghimire, Henreitta Leber, MD  memantine (NAMENDA) 5 MG tablet Take 1 tablet (5 mg total) by mouth daily. 12/01/20   Ghimire, Henreitta Leber, MD  Multiple Vitamin (MULTIVITAMIN WITH MINERALS) TABS tablet Take 1 tablet by mouth daily. 12/01/20   Ghimire, Henreitta Leber, MD  Omega-3 Fatty Acids (FISH OIL) 1000 MG CAPS Take 1 capsule (1,000 mg total) by mouth daily. 12/01/20   Ghimire, Henreitta Leber, MD  vitamin B-12 (CYANOCOBALAMIN) 1000 MCG tablet Take 1 tablet (1,000 mcg total) by mouth daily. 12/01/20   Ghimire, Henreitta Leber, MD    Physical Exam: BP 132/70   Pulse (!) 119   Temp 99.6 F (37.6 C) (Axillary)   Resp 16   SpO2 100%   General: 85 y.o. year-old female well developed well nourished in no acute distress.  Alert and obtunded. Cardiovascular: Tachycardic with no rubs or gallops.  No thyromegaly or JVD noted.  Trace lower extremity edema. 2/4 pulses in all 4 extremities. Respiratory: Clear to auscultation with no wheezes or rales.  Poor inspiratory effort. Abdomen: Soft diffusely tender nondistended with normal bowel sounds x4 quadrants. Muskuloskeletal: No cyanosis or clubbing.  Trace edema noted bilaterally Neuro: CN II-XII intact, strength, sensation, reflexes Skin: No ulcerative lesions noted or rashes Psychiatry: Judgement and insight appear normal. Mood is appropriate for condition and setting          Labs on Admission:  Basic Metabolic Panel: Recent Labs  Lab 12/03/20 0219 12/03/20 0225 12/08/20 2018  12/09/20 2030  NA 137 139 134* 137  K 4.2 4.3 4.5 4.7  CL 107  --  105 102  CO2 23  --  24 28  GLUCOSE 334*  --  228* 214*  BUN 18  --  10 14  CREATININE 1.12*  --  0.99 0.99  CALCIUM 8.4*  --  8.9 9.2   Liver Function Tests: Recent Labs  Lab 12/03/20 0219 12/08/20 2018 12/09/20 2030  AST 58* 26 25  ALT 38 24 25  ALKPHOS 66 60 65  BILITOT 0.6 0.5 1.0  PROT 5.2* 5.6* 6.5  ALBUMIN 2.4* 2.6* 3.3*   Recent Labs  Lab 12/08/20 2018  LIPASE 20   Recent Labs  Lab 12/03/20 0219  AMMONIA 26   CBC: Recent Labs  Lab 12/03/20 0219 12/03/20 0225 12/08/20 2018 12/09/20 2030  WBC 6.1  --  8.7 9.1  NEUTROABS 3.4  --  6.9 7.1  HGB 11.4* 11.9* 11.6* 12.2  HCT 36.5 35.0* 34.8* 37.7  MCV 98.9  --  95.3 96.4  PLT 137*  --  207 246   Cardiac Enzymes: No results for input(s): CKTOTAL, CKMB, CKMBINDEX, TROPONINI in the last 168 hours.  BNP (last 3 results) No results for input(s): BNP in the last 8760 hours.  ProBNP (last 3 results) No results for input(s): PROBNP in the last 8760 hours.  CBG: Recent Labs  Lab 12/03/20 0200 12/03/20 0429 12/03/20 0627 12/08/20 2002  GLUCAP 302* 276* 122* 37*    Radiological Exams on Admission: CT HEAD WO CONTRAST  Result Date: 12/09/2020 CLINICAL DATA:  Altered mental status, fever, dementia EXAM: CT HEAD WITHOUT CONTRAST TECHNIQUE: Contiguous axial images were obtained from the base of the skull through the vertex without intravenous contrast. COMPARISON:  12/03/2020 FINDINGS: Brain: No evidence of acute infarction, hemorrhage, hydrocephalus, extra-axial collection or mass lesion/mass effect. Mild age related atrophy. Subcortical white matter and periventricular small vessel ischemic changes. Basal ganglia calcifications. Vascular: Intracranial atherosclerosis. Skull: Normal. Negative for fracture or focal lesion. Sinuses/Orbits: The visualized paranasal sinuses are essentially clear. The mastoid air cells are unopacified. Other: None.  IMPRESSION: No evidence of acute intracranial abnormality. Mild atrophy with small vessel ischemic changes. Electronically Signed   By: Julian Hy M.D.   On: 12/09/2020 23:09   CT Abdomen Pelvis W Contrast  Result Date: 12/09/2020 CLINICAL DATA:  Bowel obstruction suspected. Altered mental status. No bowel movement in several days. EXAM: CT ABDOMEN AND PELVIS WITH CONTRAST TECHNIQUE: Multidetector CT imaging of the abdomen and pelvis was performed using the standard protocol following bolus administration of intravenous contrast. CONTRAST:  71mL OMNIPAQUE IOHEXOL 350 MG/ML SOLN COMPARISON:  CT renal 12/03/2020 FINDINGS: Lower chest: Bilateral lower lobe subsegmental atelectasis. No acute abnormality. Small hiatal hernia. Hepatobiliary: No focal liver abnormality. No gallstones, gallbladder wall thickening, or pericholecystic fluid. No biliary dilatation. Pancreas: No focal lesion. Normal pancreatic contour. No surrounding inflammatory changes. No main pancreatic ductal dilatation. Spleen: Normal in size without focal abnormality. Adrenals/Urinary Tract: No adrenal nodule bilaterally. Bilateral kidneys enhance symmetrically. No hydronephrosis. No hydroureter. Foley catheter tip and balloon terminate within the urinary bladder lumen. Urinary bladder is decompressed. Urinary bladder wall thickening and mucosal hyperemia. On delayed imaging, there is no urothelial wall thickening and there are no filling defects in the opacified portions of the bilateral collecting systems or ureters. Stomach/Bowel: Stomach is within normal limits. No evidence of bowel wall thickening or dilatation. No pneumatosis. Appendix appears normal. Vascular/Lymphatic: No abdominal aorta or iliac aneurysm. Moderate atherosclerotic plaque of the aorta and its branches. No abdominal, pelvic, or inguinal lymphadenopathy. Reproductive: Coarsely calcified lesions within the uterus likely with present uterine fibroids with the largest  measuring up to 3.3 cm. Uterus and bilateral adnexa are unremarkable. Other: No intraperitoneal free fluid. No intraperitoneal free gas. No organized fluid collection. Musculoskeletal: No abdominal wall hernia or abnormality. No suspicious lytic or blastic osseous lesions. No acute displaced fracture. Multilevel degenerative changes of the spine. Grade 1 anterolisthesis of L4 on L5. IMPRESSION: 1. Urinary bladder findings suggestive of infection. Correlate with urinalysis. 2. Small hiatal hernia. 3. Likely degenerative uterine fibroids. 4. No bowel obstruction.  No constipation. Electronically Signed   By: Iven Finn M.D.   On: 12/09/2020 23:16   DG Chest [facility] 1 View  Result  Date: 12/09/2020 CLINICAL DATA:  Altered mental status, fever EXAM: PORTABLE CHEST 1 VIEW COMPARISON:  12/03/2020 FINDINGS: Low lung volumes with mild bibasilar atelectasis. No focal consolidation. No pleural effusion or pneumothorax. The heart is normal in size. IMPRESSION: Low lung volumes with mild bibasilar atelectasis. Electronically Signed   By: Julian Hy M.D.   On: 12/09/2020 19:38    EKG: I independently viewed the EKG done and my findings are as followed: Sinus tachycardia rate of 110, nonspecific ST-T changes.  QTc 421.  Assessment/Plan Present on Admission:  UTI (urinary tract infection)  Active Problems:   UTI (urinary tract infection)  Catheter associated UTI, POA Presented with UA positive for pyuria Per daughter at bedside, her Foley catheter was placed on 12/03/2020 prior to discharge from [place] on 12/10/2020 for voiding trial. Follow urine culture for ID and sensitivities Started on Rocephin empirically, continue  Acute metabolic encephalopathy, suspect multifactorial, secondary to UTI in the setting of dementia and recent stroke. Delirium precautions Fall/aspiration precautions Reorient as needed  Intractable nausea, vomiting, diffuse abdominal pain,  possibly secondary to UTI No evidence of bowel obstruction on CT scan. IV antiemetics as needed Analgesics, as needed. Start clear liquid diet and advance diet as tolerated.  Type 2 diabetes with hyperglycemia Obtain hemoglobin A1c Start insulin sliding scale Hold off home oral hypoglycemics  CKD 3A Appears to be at her baseline creatinine 0.99 with GFR 57. Avoid nephrotoxic agents, dehydration and hypotension Repeat BMP in the morning.  Recent CVA  Seen on MRI 11/06/2020 PT OT assessment Daughter would like to bring her home after this admission. TOC consult to assist with DC planning  Generalized weakness PT OT assessment Fall precautions    DVT prophylaxis: Subcu Lovenox daily  Code Status: Full code as stated by her daughter who is also her medical POA at bedside.  Family Communication: Her daughter at bedside.  Disposition Plan: Admit to MedSurg unit.  Consults called: None.  Admission status: Inpatient status.  Patient will require at least 2 midnights for further evaluation and treatment of present condition.   Status is: Inpatient       Kayleen Memos MD [facility] Pager [phone]  If 7PM-7AM, please contact night-coverage [email or web address] Password [email or web address]  12/09/2020, 11:51 PM

## 2020-12-09 NOTE — ED Triage Notes (Signed)
Pt bib ems from home. Family reports AMS and fever x 1 day. Dementia at baseline.

## 2020-12-09 NOTE — ED Notes (Signed)
Patient transported to CT 

## 2020-12-09 NOTE — ED Notes (Signed)
Pt visitors became increasingly irritated with the wait, threatening to report cone. Visitors decided to take the pt. Home stating "you just don't want to see her". Visitors would not accept help into the car

## 2020-12-09 NOTE — ED Provider Notes (Signed)
[facility] DEPT Provider Note   CSN: [id number] Arrival date & time: 12/09/20  1835     History Chief Complaint  Patient presents with   Altered Mental Status   Fever    Hilary Milks is a 85 y.o. female.  HPI Patient with dementia, level 5 caveat.   Patient presents from home with family concerns of dark urine, altered mental status, fever.  She arrives via EMS.  No report of fever from EMS personnel, though she was tachycardic. Patient cannot provide any details of her history, level 5 caveat secondary to dementia.  Reportedly the family was concerned the patient was more confused than usual and urine was darker.  Foley catheter was placed within the past week, for unclear reasons.       Past Medical History:  Diagnosis Date   Benign essential tremor    Cataract    CKD (chronic kidney disease), stage II    Diabetes mellitus ([place])    type2   Full dentures    Hyperlipidemia    Memory difficulty    Osteoporosis    Uncontrolled diabetes mellitus type 2 without complications 02/19/3157   Vitamin D deficiency    Wears glasses     Patient Active Problem List   Diagnosis Date Noted   UTI (urinary tract infection) 12/09/2020   DKA (diabetic ketoacidosis) ([place]) 11/26/2020   Cerebellar infarct ([place]) 11/06/2020   Dementia without behavioral disturbance ([place]) 11/06/2020   Type 2 diabetes mellitus with other specified complication ([place]) 45/85/9292   CKD (chronic kidney disease), stage III ([place]) 11/06/2020   Essential hypertension 07/12/2017   Orthostatic syncope 06/11/2017   Aortic ejection murmur 06/11/2017   Uncontrolled type 2 diabetes mellitus with nephropathy ([place]) 03/24/2014   Upper abdominal pain 03/23/2014   Nausea 03/23/2014   Abnormal findings on radiological examination of gastrointestinal tract 03/23/2014   Infected sebaceous cyst 04/15/2013    Past Surgical History:  Procedure Laterality Date   APPENDECTOMY      childhood   CATARACT EXTRACTION, BILATERAL  2014   COLONOSCOPY     in [place] in [place] N/A 05/18/2013   Procedure: EXCISION OF SEBACEOUS CYST BACK;  Surgeon: Merrie Roof, MD;  Location: [facility];  Service: General;  Laterality: N/A;   surgery to remove cyst on back  2010   x 2   TRANSTHORACIC ECHOCARDIOGRAM  06/17/2017    EF 60-65%.  Normal wall motion.  GR 1 DD.  No obvious valvular lesions.   TUBAL LIGATION       OB History   No obstetric history on file.     Family History  Problem Relation Age of Onset   Tuberculosis Mother    Other Father        homicide   Colon cancer Neg Hx    Esophageal cancer Neg Hx    Rectal cancer Neg Hx    Stomach cancer Neg Hx     Social History   Tobacco Use   Smoking status: Never   Smokeless tobacco: Never  Vaping Use   Vaping Use: Never used  Substance Use Topics   Alcohol use: No   Drug use: No    Home Medications Prior to Admission medications   Medication Sig Start Date End Date Taking? Authorizing Provider  amLODipine (NORVASC) 2.5 MG tablet Take 1 tablet (2.5 mg total) by mouth daily. 12/01/20   Ghimire, Henreitta Leber, MD  aspirin 81  MG tablet Take 1 tablet (81 mg total) by mouth daily. 12/01/20   Ghimire, Henreitta Leber, MD  atorvastatin (LIPITOR) 40 MG tablet Take 1 tablet (40 mg total) by mouth daily. 12/01/20   Ghimire, Henreitta Leber, MD  blood glucose meter kit and supplies Dispense based on patient and insurance preference. Use up to four times daily as directed. (FOR ICD-10 E10.9, E11.9). 12/03/20   Veryl Speak, MD  calcium carbonate (OSCAL) 1500 (600 Ca) MG TABS tablet Take 1 tablet (1,500 mg total) by mouth daily with breakfast. 12/01/20   Ghimire, Henreitta Leber, MD  esomeprazole (NEXIUM) 40 MG capsule Take 1 capsule (40 mg total) by mouth daily. 12/01/20   Ghimire, Henreitta Leber, MD  insulin aspart (NOVOLOG) 100 UNIT/ML injection Inject 0-10 Units into the skin as directed. 0-9 Units,  Subcutaneous, 3 times daily with meals: 0-69 implement hypoglycemic measures, 70-150=0 units, 151-199=2 units, 200-249=4 units, 250-299=6 units, 300-349=8 units, 350-399=10 units, [phone] Notify MD 12/03/20   Veryl Speak, MD  insulin glargine (LANTUS) 100 UNIT/ML Solostar Pen Inject 12 Units into the skin daily. 12/01/20   Ghimire, Henreitta Leber, MD  Insulin Pen Needle 32G X 4 MM MISC 1 each by Does not apply route as needed. 12/01/20   Ghimire, Henreitta Leber, MD  latanoprost (XALATAN) 0.005 % ophthalmic solution Place 1 drop into both eyes every evening. 12/01/20   Ghimire, Henreitta Leber, MD  memantine (NAMENDA) 5 MG tablet Take 1 tablet (5 mg total) by mouth daily. 12/01/20   Ghimire, Henreitta Leber, MD  Multiple Vitamin (MULTIVITAMIN WITH MINERALS) TABS tablet Take 1 tablet by mouth daily. 12/01/20   Ghimire, Henreitta Leber, MD  Omega-3 Fatty Acids (FISH OIL) 1000 MG CAPS Take 1 capsule (1,000 mg total) by mouth daily. 12/01/20   Ghimire, Henreitta Leber, MD  vitamin B-12 (CYANOCOBALAMIN) 1000 MCG tablet Take 1 tablet (1,000 mcg total) by mouth daily. 12/01/20   Ghimire, Henreitta Leber, MD    Allergies    Metformin and related and Saxagliptin  Review of Systems   Review of Systems  Unable to perform ROS: Dementia   Physical Exam Updated Vital Signs BP 132/70   Pulse (!) 119   Temp 99.6 F (37.6 C) (Axillary)   Resp 16   SpO2 100%   Physical Exam Vitals and nursing note reviewed.  Constitutional:      General: She is not in acute distress.    Appearance: She is well-developed.  HENT:     Head: Normocephalic and atraumatic.  Eyes:     Conjunctiva/sclera: Conjunctivae normal.  Cardiovascular:     Rate and Rhythm: Normal rate and regular rhythm.  Pulmonary:     Effort: Pulmonary effort is normal. No respiratory distress.     Breath sounds: Normal breath sounds. No stridor.  Abdominal:     General: There is no distension.     Tenderness: There is no abdominal tenderness. There is no guarding.  Skin:     General: Skin is warm and dry.  Neurological:     Mental Status: She is alert.     Cranial Nerves: No cranial nerve deficit.     Motor: Atrophy present.     Comments: Patient does not follow commands reliably, does move her extremities spontaneously.  Psychiatric:     Comments: Reserved, minimally verbal, impaired memory and cognition.    ED Results / Procedures / Treatments   Labs (all labs ordered are listed, but only abnormal results are displayed) Labs Reviewed  URINALYSIS, ROUTINE W  REFLEX MICROSCOPIC - Abnormal; Notable for the following components:      Result Value   APPearance CLOUDY (*)    Glucose, UA 50 (*)    Hgb urine dipstick MODERATE (*)    Ketones, ur 20 (*)    Leukocytes,Ua LARGE (*)    WBC, UA >50 (*)    Bacteria, UA FEW (*)    All other components within normal limits  COMPREHENSIVE METABOLIC PANEL - Abnormal; Notable for the following components:   Glucose, Bld 214 (*)    Albumin 3.3 (*)    GFR, Estimated 57 (*)    All other components within normal limits  URINE CULTURE  RESP PANEL BY RT-PCR (FLU A&B, COVID) ARPGX2  LACTIC ACID, PLASMA  CBC WITH DIFFERENTIAL/PLATELET  CBC  BASIC METABOLIC PANEL  MAGNESIUM  PHOSPHORUS  GASTRIC OCCULT BLOOD (1-CARD TO LAB)    EKG None  Radiology CT HEAD WO CONTRAST  Result Date: 12/09/2020 CLINICAL DATA:  Altered mental status, fever, dementia EXAM: CT HEAD WITHOUT CONTRAST TECHNIQUE: Contiguous axial images were obtained from the base of the skull through the vertex without intravenous contrast. COMPARISON:  12/03/2020 FINDINGS: Brain: No evidence of acute infarction, hemorrhage, hydrocephalus, extra-axial collection or mass lesion/mass effect. Mild age related atrophy. Subcortical white matter and periventricular small vessel ischemic changes. Basal ganglia calcifications. Vascular: Intracranial atherosclerosis. Skull: Normal. Negative for fracture or focal lesion. Sinuses/Orbits: The visualized paranasal sinuses  are essentially clear. The mastoid air cells are unopacified. Other: None. IMPRESSION: No evidence of acute intracranial abnormality. Mild atrophy with small vessel ischemic changes. Electronically Signed   By: Julian Hy M.D.   On: 12/09/2020 23:09   CT Abdomen Pelvis W Contrast  Result Date: 12/09/2020 CLINICAL DATA:  Bowel obstruction suspected. Altered mental status. No bowel movement in several days. EXAM: CT ABDOMEN AND PELVIS WITH CONTRAST TECHNIQUE: Multidetector CT imaging of the abdomen and pelvis was performed using the standard protocol following bolus administration of intravenous contrast. CONTRAST:  65mL OMNIPAQUE IOHEXOL 350 MG/ML SOLN COMPARISON:  CT renal 12/03/2020 FINDINGS: Lower chest: Bilateral lower lobe subsegmental atelectasis. No acute abnormality. Small hiatal hernia. Hepatobiliary: No focal liver abnormality. No gallstones, gallbladder wall thickening, or pericholecystic fluid. No biliary dilatation. Pancreas: No focal lesion. Normal pancreatic contour. No surrounding inflammatory changes. No main pancreatic ductal dilatation. Spleen: Normal in size without focal abnormality. Adrenals/Urinary Tract: No adrenal nodule bilaterally. Bilateral kidneys enhance symmetrically. No hydronephrosis. No hydroureter. Foley catheter tip and balloon terminate within the urinary bladder lumen. Urinary bladder is decompressed. Urinary bladder wall thickening and mucosal hyperemia. On delayed imaging, there is no urothelial wall thickening and there are no filling defects in the opacified portions of the bilateral collecting systems or ureters. Stomach/Bowel: Stomach is within normal limits. No evidence of bowel wall thickening or dilatation. No pneumatosis. Appendix appears normal. Vascular/Lymphatic: No abdominal aorta or iliac aneurysm. Moderate atherosclerotic plaque of the aorta and its branches. No abdominal, pelvic, or inguinal lymphadenopathy. Reproductive: Coarsely calcified lesions  within the uterus likely with present uterine fibroids with the largest measuring up to 3.3 cm. Uterus and bilateral adnexa are unremarkable. Other: No intraperitoneal free fluid. No intraperitoneal free gas. No organized fluid collection. Musculoskeletal: No abdominal wall hernia or abnormality. No suspicious lytic or blastic osseous lesions. No acute displaced fracture. Multilevel degenerative changes of the spine. Grade 1 anterolisthesis of L4 on L5. IMPRESSION: 1. Urinary bladder findings suggestive of infection. Correlate with urinalysis. 2. Small hiatal hernia. 3. Likely degenerative uterine fibroids. 4. No  bowel obstruction.  No constipation. Electronically Signed   By: Iven Finn M.D.   On: 12/09/2020 23:16   DG Chest Port 1 View  Result Date: 12/09/2020 CLINICAL DATA:  Altered mental status, fever EXAM: PORTABLE CHEST 1 VIEW COMPARISON:  12/03/2020 FINDINGS: Low lung volumes with mild bibasilar atelectasis. No focal consolidation. No pleural effusion or pneumothorax. The heart is normal in size. IMPRESSION: Low lung volumes with mild bibasilar atelectasis. Electronically Signed   By: Julian Hy M.D.   On: 12/09/2020 19:38    Procedures Procedures   Medications Ordered in ED Medications  sodium chloride 0.9 % bolus 1,000 mL (0 mLs Intravenous Stopped 12/09/20 2145)    And  0.9 %  sodium chloride infusion ( Intravenous New Bag/Given 12/09/20 1941)  enoxaparin (LOVENOX) injection 40 mg (has no administration in time range)  0.9 %  sodium chloride infusion (has no administration in time range)  ondansetron (ZOFRAN) injection 4 mg (has no administration in time range)  insulin aspart (novoLOG) injection 0-9 Units (has no administration in time range)  insulin aspart (novoLOG) injection 0-5 Units (has no administration in time range)  cefTRIAXone (ROCEPHIN) 2 g in sodium chloride 0.9 % 100 mL IVPB (has no administration in time range)  iohexol (OMNIPAQUE) 350 MG/ML injection 75 mL  (75 mLs Intravenous Contrast Given 12/09/20 2230)  LORazepam (ATIVAN) injection 1 mg (1 mg Intravenous Given 12/09/20 2148)  ondansetron (ZOFRAN) injection 4 mg (4 mg Intravenous Given 12/09/20 2355)    ED Course  I have reviewed the triage vital signs and the nursing notes.  Pertinent labs & imaging results that were available during my care of the patient were reviewed by me and considered in my medical decision making (see chart for details).   7:33 PM Patient remains in no distress.  Daughter notes specific concern for abdominal pain, given patient's decreased bowel movements recently and spite of enema. The patient herself remains in similar condition, essentially nonparticipant. Cardiac 105 sinus tach abnormal Pulse ox 95% room air normal 12:01 AM Patient had 1 episode of emesis.  I have reviewed labs, CT, x-ray with patient's daughters, 1 in person, on the telephone.  Labs notable for urinary tract infection, given catheter associated features she will start ceftriaxone require admission given delirium, ongoing nausea, vomiting and weakness.  CT scan generally reassuring, no evidence for obstruction, or mass.  Final Clinical Impression(s) / ED Diagnoses Final diagnoses:  Urinary tract infection associated with indwelling urethral catheter, initial encounter ([place])  Vomiting, unspecified vomiting type, unspecified whether nausea present  Delirium     Carmin Muskrat, MD 12/10/20 0001

## 2020-12-10 ENCOUNTER — Encounter (HOSPITAL_COMMUNITY): Payer: Self-pay | Admitting: Internal Medicine

## 2020-12-10 ENCOUNTER — Encounter: Payer: Self-pay | Admitting: *Deleted

## 2020-12-10 DIAGNOSIS — T83511A Infection and inflammatory reaction due to indwelling urethral catheter, initial encounter: Principal | ICD-10-CM

## 2020-12-10 DIAGNOSIS — R1110 Vomiting, unspecified: Secondary | ICD-10-CM | POA: Insufficient documentation

## 2020-12-10 DIAGNOSIS — R410 Disorientation, unspecified: Secondary | ICD-10-CM | POA: Insufficient documentation

## 2020-12-10 DIAGNOSIS — A419 Sepsis, unspecified organism: Secondary | ICD-10-CM | POA: Diagnosis not present

## 2020-12-10 LAB — CBC
HCT: 35.9 % — ABNORMAL LOW (ref 36.0–46.0)
Hemoglobin: 11.6 g/dL — ABNORMAL LOW (ref 12.0–15.0)
MCH: 31 pg (ref 26.0–34.0)
MCHC: 32.3 g/dL (ref 30.0–36.0)
MCV: 96 fL (ref 80.0–100.0)
Platelets: 250 10*3/uL (ref 150–400)
RBC: 3.74 MIL/uL — ABNORMAL LOW (ref 3.87–5.11)
RDW: 12.8 % (ref 11.5–15.5)
WBC: 8.8 10*3/uL (ref 4.0–10.5)
nRBC: 0 % (ref 0.0–0.2)

## 2020-12-10 LAB — BASIC METABOLIC PANEL
Anion gap: 11 (ref 5–15)
BUN: 14 mg/dL (ref 8–23)
CO2: 24 mmol/L (ref 22–32)
Calcium: 8.6 mg/dL — ABNORMAL LOW (ref 8.9–10.3)
Chloride: 99 mmol/L (ref 98–111)
Creatinine, Ser: 0.81 mg/dL (ref 0.44–1.00)
GFR, Estimated: >60 mL/min (ref >60)
Glucose, Bld: 298 mg/dL — ABNORMAL HIGH (ref 70–99)
Potassium: 4.3 mmol/L (ref 3.5–5.1)
Sodium: 134 mmol/L — ABNORMAL LOW (ref 135–145)

## 2020-12-10 LAB — RESP PANEL BY RT-PCR (FLU A&B, COVID) ARPGX2
Influenza A by PCR: NEGATIVE
Influenza B by PCR: NEGATIVE
SARS Coronavirus 2 by RT PCR: NEGATIVE

## 2020-12-10 LAB — GLUCOSE, CAPILLARY: Glucose-Capillary: 146 mg/dL — ABNORMAL HIGH (ref 70–99)

## 2020-12-10 LAB — MAGNESIUM: Magnesium: 1.9 mg/dL (ref 1.7–2.4)

## 2020-12-10 LAB — CBG MONITORING, ED
Glucose-Capillary: 158 mg/dL — ABNORMAL HIGH (ref 70–99)
Glucose-Capillary: 171 mg/dL — ABNORMAL HIGH (ref 70–99)
Glucose-Capillary: 249 mg/dL — ABNORMAL HIGH (ref 70–99)
Glucose-Capillary: 253 mg/dL — ABNORMAL HIGH (ref 70–99)

## 2020-12-10 LAB — PHOSPHORUS: Phosphorus: 3.2 mg/dL (ref 2.5–4.6)

## 2020-12-10 MED ORDER — INSULIN GLARGINE-YFGN 100 UNIT/ML ~~LOC~~ SOLN
12.0000 [IU] | Freq: Every day | SUBCUTANEOUS | Status: DC
  Administered 2020-12-11 – 2020-12-14 (×3): 12 [IU] via SUBCUTANEOUS
  Filled 2020-12-10 (×4): qty 0.12

## 2020-12-10 MED ORDER — MELATONIN 3 MG PO TABS
3.0000 mg | ORAL_TABLET | Freq: Every evening | ORAL | Status: DC | PRN
  Administered 2020-12-12 – 2020-12-15 (×4): 3 mg via ORAL
  Filled 2020-12-10 (×7): qty 1

## 2020-12-10 MED ORDER — PANTOPRAZOLE SODIUM 40 MG IV SOLR
40.0000 mg | Freq: Two times a day (BID) | INTRAVENOUS | Status: DC
  Administered 2020-12-10 – 2020-12-18 (×16): 40 mg via INTRAVENOUS
  Filled 2020-12-10 (×16): qty 40

## 2020-12-10 MED ORDER — ACETAMINOPHEN 325 MG PO TABS
650.0000 mg | ORAL_TABLET | Freq: Four times a day (QID) | ORAL | Status: DC | PRN
  Administered 2020-12-16: 650 mg via ORAL
  Filled 2020-12-10 (×2): qty 2

## 2020-12-10 MED ORDER — SODIUM CHLORIDE 0.9 % IV SOLN
12.5000 mg | Freq: Four times a day (QID) | INTRAVENOUS | Status: DC | PRN
  Administered 2020-12-10 (×2): 12.5 mg via INTRAVENOUS
  Filled 2020-12-10 (×2): qty 12.5

## 2020-12-10 MED ORDER — POLYETHYLENE GLYCOL 3350 17 G PO PACK: 17.0000 g | PACK | Freq: Every day | ORAL | Status: DC | PRN

## 2020-12-10 MED ORDER — METOPROLOL TARTRATE 5 MG/5ML IV SOLN: 2.5000 mg | Freq: Four times a day (QID) | INTRAVENOUS | Status: DC | PRN

## 2020-12-10 MED ORDER — PIPERACILLIN-TAZOBACTAM 3.375 G IVPB
3.3750 g | Freq: Three times a day (TID) | INTRAVENOUS | Status: AC
  Administered 2020-12-10 – 2020-12-14 (×13): 3.375 g via INTRAVENOUS
  Filled 2020-12-10 (×14): qty 50

## 2020-12-10 MED ORDER — PIPERACILLIN-TAZOBACTAM 3.375 G IVPB 30 MIN
3.3750 g | Freq: Once | INTRAVENOUS | Status: AC
  Administered 2020-12-10: 3.375 g via INTRAVENOUS
  Filled 2020-12-10: qty 50

## 2020-12-10 NOTE — Progress Notes (Signed)
Inpatient Diabetes Program Recommendations  AACE/ADA: New Consensus Statement on Inpatient Glycemic Control (2015)  Target Ranges:  Prepandial:   less than 140 mg/dL      Peak postprandial:   less than 180 mg/dL (1-2 hours)      Critically ill patients:  140 - 180 mg/dL   Lab Results  Component Value Date   GLUCAP 171 (H) 12/10/2020   HGBA1C 8.3 (H) 11/07/2020    Review of Glycemic Control  Diabetes history: DM2 Outpatient Diabetes medications: Lantus 12 units QD, Novolog 0-10 units TID Current orders for Inpatient glycemic control: Novolog 0-9 units TID with meals and 0-5 HS  HgbA1C - 8.3%  Inpatient Diabetes Program Recommendations:    Add Lantus 6 units QD  Follow glucose trends.  Thank you. Lorenda Peck, RD, LDN, CDE Inpatient Diabetes Coordinator [phone]

## 2020-12-10 NOTE — Progress Notes (Signed)
PROGRESS NOTE  Kavin Leech    DOB: 14-Mar-1935, 85 y.o.  CHE:[id number]  PCP: Harlan Stains, MD   Code Status: Full Code   DOA: 12/09/2020   LOS: 1  Brief Narrative of Current Hospitalization  Kenlei Schlemmer is a 85 y.o. female with a PMH significant for dementia, type II DM, HLD, recent SCA CVA. They presented from home to the ED on 12/09/2020 with acute change in mental status associated with N/V, abdominal pain x 1 days. In the ED, it was found that they had urinary tract infection associated with Foley catheter. They were treated with empiric antibiotics, supportive care.  Patient was admitted to medicine service for further workup and management of sepsis as outlined in detail below.  12/10/20 - cautious status  Assessment & Plan  Active Problems:   UTI (urinary tract infection)  Severe Sepsis secondary to UTI with AMS- patient minimally responsive on exam today but had received ativan hours earlier. Vital signs stable, afebrile. LA 1.4. Negative Covid. Had urine culture on 10/29 which was positive for pseudomonas.  -Urine culture pending - blood cultures ordered (after Abx already started) -continue empiric IV antibiotics  - CTX (10/30-  - add zosyn (10/31- - increasing maintenance IV fluids as patient has intermittent soft blood pressure (99/52) and tachycardia to 120. - zofran PRN - delirium precautions - BMP am to monitor kidney function  Type II DM with long-term insulin use  Hyperglycemia- recent A1c 8.3. home meds include: 12units lantus daily and sliding scale - continue home insulin medication with CBGs QID  Recent CVA with residual weakness  HLD- home meds include atorvastatin 40mg , ASA - PT/OT evaluation once improving  Hematuria- urinalysis showing 11-20 RBCs per HPF - follow up urinalysis after resolution of infection  Dementia- home meds include memantine - holding while NPO  HTN- home meds include amlodipine - holding home meds for soft blood  pressures  GERD- home meds include esomeprazole - holding while NPO  Normocytic anemia- hgb 11.6 on admission. Appear to be at baseline  DVT prophylaxis: enoxaparin (LOVENOX) injection 40 mg Start: 12/09/20 2345   Diet:  Diet Orders (From admission, onward)     Start     Ordered   12/10/20 0143  Diet clear liquid Room service appropriate? Yes; Fluid consistency: Thin  Diet effective now       Comments: Advance diet as tolerated, heart healthy carb modified diet.  Question Answer Comment  Room service appropriate? Yes   Fluid consistency: Thin      12/10/20 0142            Subjective 12/10/20    Pt unable to provide history  Disposition Plan & Communication  Patient status: Inpatient  Admitted From: Home Disposition: Home Anticipated discharge date: 11/2  Family Communication: daughter on phone  Consults, Procedures, Significant Events  Consultants:  none  Procedures/significant events:  none Antimicrobials:  Anti-infectives (From admission, onward)    Start     Dose/Rate Route Frequency Ordered Stop   12/10/20 0000  cefTRIAXone (ROCEPHIN) 2 g in sodium chloride 0.9 % 100 mL IVPB        2 g 200 mL/hr over 30 Minutes Intravenous Every 24 hours 12/09/20 2354     12/09/20 2330  cefTRIAXone (ROCEPHIN) 1 g in sodium chloride 0.9 % 100 mL IVPB  Status:  Discontinued        1 g 200 mL/hr over 30 Minutes Intravenous  Once 12/09/20 2329 12/09/20 2357  Objective   Vitals:   12/10/20 0255 12/10/20 0430 12/10/20 0600 12/10/20 0630  BP: (!) 144/74 129/71 102/63 102/62  Pulse: (!) 107 97 87 84  Resp: (!) 25 20 18 17   Temp:      TempSrc:      SpO2: 98% 98% 95% 94%    Intake/Output Summary (Last 24 hours) at 12/10/2020 0757 Last data filed at 12/10/2020 0153 Gross per 24 hour  Intake 850 ml  Output --  Net 850 ml   There were no vitals filed for this visit.  Patient BMI: There is no height or weight on file to calculate BMI.   Physical Exam: General:  asleep NAD HEENT: atraumatic, clear conjunctiva, anicteric sclera, moist mucus membranes Respiratory: normal respiratory effort. Clear breath sounds diffusely Cardiovascular: normal S1/S2, RRR, no JVD, murmurs, rubs, gallops, quick capillary refill  Gastrointestinal: soft, NT, ND, no HSM felt Nervous: PERRL, responsive to physical stimuli Extremities: no edema, normal tone Skin: dry, intact, normal temperature, normal color, No rashes, lesions or ulcers  Labs   I have personally reviewed following labs and imaging studies Admission on 12/09/2020  Component Date Value Ref Range Status   Color, Urine 12/09/2020 YELLOW  YELLOW Final   APPearance 12/09/2020 CLOUDY (A)  CLEAR Final   Specific Gravity, Urine 12/09/2020 1.015  1.005 - 1.030 Final   pH 12/09/2020 5.0  5.0 - 8.0 Final   Glucose, UA 12/09/2020 50 (A)  NEGATIVE mg/dL Final   Hgb urine dipstick 12/09/2020 MODERATE (A)  NEGATIVE Final   Bilirubin Urine 12/09/2020 NEGATIVE  NEGATIVE Final   Ketones, ur 12/09/2020 20 (A)  NEGATIVE mg/dL Final   Protein, ur 12/09/2020 NEGATIVE  NEGATIVE mg/dL Final   Nitrite 12/09/2020 NEGATIVE  NEGATIVE Final   Leukocytes,Ua 12/09/2020 LARGE (A)  NEGATIVE Final   RBC / HPF 12/09/2020 11-20  0 - 5 RBC/hpf Final   WBC, UA 12/09/2020 >50 (A)  0 - 5 WBC/hpf Final   Bacteria, UA 12/09/2020 FEW (A)  NONE SEEN Final   Squamous Epithelial / LPF 12/09/2020 0-5  0 - 5 Final   WBC Clumps 12/09/2020 PRESENT   Final   Mucus 12/09/2020 PRESENT   Final   Hyaline Casts, UA 12/09/2020 PRESENT   Final   Sodium 12/09/2020 137  135 - 145 mmol/L Final   Potassium 12/09/2020 4.7  3.5 - 5.1 mmol/L Final   Chloride 12/09/2020 102  98 - 111 mmol/L Final   CO2 12/09/2020 28  22 - 32 mmol/L Final   Glucose, Bld 12/09/2020 214 (A)  70 - 99 mg/dL Final   BUN 12/09/2020 14  8 - 23 mg/dL Final   Creatinine, Ser 12/09/2020 0.99  0.44 - 1.00 mg/dL Final   Calcium 12/09/2020 9.2  8.9 - 10.3 mg/dL Final   Total Protein  12/09/2020 6.5  6.5 - 8.1 g/dL Final   Albumin 12/09/2020 3.3 (A)  3.5 - 5.0 g/dL Final   AST 12/09/2020 25  15 - 41 U/L Final   ALT 12/09/2020 25  0 - 44 U/L Final   Alkaline Phosphatase 12/09/2020 65  38 - 126 U/L Final   Total Bilirubin 12/09/2020 1.0  0.3 - 1.2 mg/dL Final   GFR, Estimated 12/09/2020 57 (A)  >60 mL/min Final   Anion gap 12/09/2020 7  5 - 15 Final   Lactic Acid, Venous 12/09/2020 1.4  0.5 - 1.9 mmol/L Final   WBC 12/09/2020 9.1  4.0 - 10.5 K/uL Final   RBC 12/09/2020 3.91  3.87 - 5.11 MIL/uL Final   Hemoglobin 12/09/2020 12.2  12.0 - 15.0 g/dL Final   HCT 12/09/2020 37.7  36.0 - 46.0 % Final   MCV 12/09/2020 96.4  80.0 - 100.0 fL Final   MCH 12/09/2020 31.2  26.0 - 34.0 pg Final   MCHC 12/09/2020 32.4  30.0 - 36.0 g/dL Final   RDW 12/09/2020 12.7  11.5 - 15.5 % Final   Platelets 12/09/2020 246  150 - 400 K/uL Final   nRBC 12/09/2020 0.0  0.0 - 0.2 % Final   Neutrophils Relative % 12/09/2020 78  % Final   Neutro Abs 12/09/2020 7.1  1.7 - 7.7 K/uL Final   Lymphocytes Relative 12/09/2020 13  % Final   Lymphs Abs 12/09/2020 1.2  0.7 - 4.0 K/uL Final   Monocytes Relative 12/09/2020 6  % Final   Monocytes Absolute 12/09/2020 0.6  0.1 - 1.0 K/uL Final   Eosinophils Relative 12/09/2020 2  % Final   Eosinophils Absolute 12/09/2020 0.2  0.0 - 0.5 K/uL Final   Basophils Relative 12/09/2020 1  % Final   Basophils Absolute 12/09/2020 0.1  0.0 - 0.1 K/uL Final   Immature Granulocytes 12/09/2020 0  % Final   Abs Immature Granulocytes 12/09/2020 0.03  0.00 - 0.07 K/uL Final   SARS Coronavirus 2 by RT PCR 12/10/2020 NEGATIVE  NEGATIVE Final   Influenza A by PCR 12/10/2020 NEGATIVE  NEGATIVE Final   Influenza B by PCR 12/10/2020 NEGATIVE  NEGATIVE Final   WBC 12/10/2020 8.8  4.0 - 10.5 K/uL Final   RBC 12/10/2020 3.74 (A)  3.87 - 5.11 MIL/uL Final   Hemoglobin 12/10/2020 11.6 (A)  12.0 - 15.0 g/dL Final   HCT 12/10/2020 35.9 (A)  36.0 - 46.0 % Final   MCV 12/10/2020 96.0  80.0  - 100.0 fL Final   MCH 12/10/2020 31.0  26.0 - 34.0 pg Final   MCHC 12/10/2020 32.3  30.0 - 36.0 g/dL Final   RDW 12/10/2020 12.8  11.5 - 15.5 % Final   Platelets 12/10/2020 250  150 - 400 K/uL Final   nRBC 12/10/2020 0.0  0.0 - 0.2 % Final   Sodium 12/10/2020 134 (A)  135 - 145 mmol/L Final   Potassium 12/10/2020 4.3  3.5 - 5.1 mmol/L Final   Chloride 12/10/2020 99  98 - 111 mmol/L Final   CO2 12/10/2020 24  22 - 32 mmol/L Final   Glucose, Bld 12/10/2020 298 (A)  70 - 99 mg/dL Final   BUN 12/10/2020 14  8 - 23 mg/dL Final   Creatinine, Ser 12/10/2020 0.81  0.44 - 1.00 mg/dL Final   Calcium 12/10/2020 8.6 (A)  8.9 - 10.3 mg/dL Final   GFR, Estimated 12/10/2020 >60  >60 mL/min Final   Anion gap 12/10/2020 11  5 - 15 Final   Magnesium 12/10/2020 1.9  1.7 - 2.4 mg/dL Final   Phosphorus 12/10/2020 3.2  2.5 - 4.6 mg/dL Final   Glucose-Capillary 12/10/2020 249 (A)  70 - 99 mg/dL Final    Imaging Studies  CT HEAD WO CONTRAST  Result Date: 12/09/2020 CLINICAL DATA:  Altered mental status, fever, dementia EXAM: CT HEAD WITHOUT CONTRAST TECHNIQUE: Contiguous axial images were obtained from the base of the skull through the vertex without intravenous contrast. COMPARISON:  12/03/2020 FINDINGS: Brain: No evidence of acute infarction, hemorrhage, hydrocephalus, extra-axial collection or mass lesion/mass effect. Mild age related atrophy. Subcortical white matter and periventricular small vessel ischemic changes. Basal ganglia calcifications. Vascular: Intracranial atherosclerosis.  Skull: Normal. Negative for fracture or focal lesion. Sinuses/Orbits: The visualized paranasal sinuses are essentially clear. The mastoid air cells are unopacified. Other: None. IMPRESSION: No evidence of acute intracranial abnormality. Mild atrophy with small vessel ischemic changes. Electronically Signed   By: Julian Hy M.D.   On: 12/09/2020 23:09   CT Abdomen Pelvis W Contrast  Result Date: 12/09/2020 CLINICAL  DATA:  Bowel obstruction suspected. Altered mental status. No bowel movement in several days. EXAM: CT ABDOMEN AND PELVIS WITH CONTRAST TECHNIQUE: Multidetector CT imaging of the abdomen and pelvis was performed using the standard protocol following bolus administration of intravenous contrast. CONTRAST:  67mL OMNIPAQUE IOHEXOL 350 MG/ML SOLN COMPARISON:  CT renal 12/03/2020 FINDINGS: Lower chest: Bilateral lower lobe subsegmental atelectasis. No acute abnormality. Small hiatal hernia. Hepatobiliary: No focal liver abnormality. No gallstones, gallbladder wall thickening, or pericholecystic fluid. No biliary dilatation. Pancreas: No focal lesion. Normal pancreatic contour. No surrounding inflammatory changes. No main pancreatic ductal dilatation. Spleen: Normal in size without focal abnormality. Adrenals/Urinary Tract: No adrenal nodule bilaterally. Bilateral kidneys enhance symmetrically. No hydronephrosis. No hydroureter. Foley catheter tip and balloon terminate within the urinary bladder lumen. Urinary bladder is decompressed. Urinary bladder wall thickening and mucosal hyperemia. On delayed imaging, there is no urothelial wall thickening and there are no filling defects in the opacified portions of the bilateral collecting systems or ureters. Stomach/Bowel: Stomach is within normal limits. No evidence of bowel wall thickening or dilatation. No pneumatosis. Appendix appears normal. Vascular/Lymphatic: No abdominal aorta or iliac aneurysm. Moderate atherosclerotic plaque of the aorta and its branches. No abdominal, pelvic, or inguinal lymphadenopathy. Reproductive: Coarsely calcified lesions within the uterus likely with present uterine fibroids with the largest measuring up to 3.3 cm. Uterus and bilateral adnexa are unremarkable. Other: No intraperitoneal free fluid. No intraperitoneal free gas. No organized fluid collection. Musculoskeletal: No abdominal wall hernia or abnormality. No suspicious lytic or blastic  osseous lesions. No acute displaced fracture. Multilevel degenerative changes of the spine. Grade 1 anterolisthesis of L4 on L5. IMPRESSION: 1. Urinary bladder findings suggestive of infection. Correlate with urinalysis. 2. Small hiatal hernia. 3. Likely degenerative uterine fibroids. 4. No bowel obstruction.  No constipation. Electronically Signed   By: Iven Finn M.D.   On: 12/09/2020 23:16   DG Chest Port 1 View  Result Date: 12/09/2020 CLINICAL DATA:  Altered mental status, fever EXAM: PORTABLE CHEST 1 VIEW COMPARISON:  12/03/2020 FINDINGS: Low lung volumes with mild bibasilar atelectasis. No focal consolidation. No pleural effusion or pneumothorax. The heart is normal in size. IMPRESSION: Low lung volumes with mild bibasilar atelectasis. Electronically Signed   By: Julian Hy M.D.   On: 12/09/2020 19:38   Medications   Scheduled Meds:  enoxaparin (LOVENOX) injection  40 mg Subcutaneous Q24H   insulin aspart  0-5 Units Subcutaneous QHS   insulin aspart  0-9 Units Subcutaneous TID WC   No recently discontinued medications to reconcile  LOS: 1 day   Time spent: >34min  Donte Lenzo L Deshunda Thackston, DO [facility] 12/10/2020, 7:57 AM   Please refer to amion to contact the [facility] Attending or Consulting provider for this pt  [email or web address] Available by Epic secure chat 7AM-7PM. If 7PM-7AM, please contact night-coverage

## 2020-12-10 NOTE — Progress Notes (Signed)
Spoke with patient's daughter [name]) on the phone.  She endorses that patient had multiple episodes of bloody vomit while they were at the [facility] ED and there was a positive gastric occult card. There is not a lab record of this in the system. Patient's hgb is stable around baseline. No episodes of bleeding since presenting to [facility] ED.  I also do not see any documentation of hematemesis from this or recent ED visits. There is documented family reported hematuria which was likely related to trauma from foley previously placed.  Consulted GI to evaluate per family request for "higher level of imaging".   Additionally, daughter states that a doctor told her that her mom's head imaging showed "trauma" and she wants to know exactly where the trauma was.  I reviewed all of her head imaging over the past several days with the daughter and there is no mention of trauma.   Daughter expressed extreme frustration and is displeased with overall care.

## 2020-12-10 NOTE — Progress Notes (Signed)
PHARMACY NOTE -  [facility] has been assisting with dosing of Zosyn for UTI. Dosage remains stable at 3.375 g IV q8 hr and further renal adjustments   Pharmacy will sign off, following peripherally for culture results or dose adjustments. Please reconsult if a change in clinical status warrants re-evaluation of dosage.  Reuel Boom, PharmD, BCPS [phone] 12/10/2020, 4:49 PM

## 2020-12-10 NOTE — ED Notes (Signed)
ED TO INPATIENT HANDOFF REPORT  ED Nurse Name and Phone #: [phone], Erick Colace RN   S Name/Age/Gender Stacey Fuentes 85 y.o. female Room/Bed: WA16/WA16  Code Status   Code Status: Full Code  Home/SNF/Other Home Patient oriented to: self Is this baseline?  No  Triage Complete: Triage complete  Chief Complaint UTI (urinary tract infection) [N39.0]  Triage Note Pt bib ems from home. Family reports AMS and fever x 1 day. Dementia at baseline.    Allergies Allergies  Allergen Reactions   Citalopram Nausea Only    Abd pain   Metformin And Related Nausea Only   Saxagliptin Nausea Only    Level of Care/Admitting Diagnosis ED Disposition     ED Disposition  Admit   Condition  --   Comment  Hospital Area: [facility] [[id number]] Level of Care: Med-Surg [16] May admit patient to [facility] or [facility] if equivalent level of care is available:: Yes Covid Evaluation: covid negative Diagnosis: UTI (urinary tract infec tion) [277412] Admitting Physician: Kayleen Memos [[id number]] Attending Physician: Kayleen Memos [[id number]] Estimated length of stay: past midnight tomorrow Certification:: I certify this patient will need inpatient services for at least 2 midnigh ts          B Medical/Surgery History Past Medical History:  Diagnosis Date   Benign essential tremor    Cataract    CKD (chronic kidney disease), stage II    Dementia ([place])    Diabetes mellitus ([place])    type2   Full dentures    Hyperlipidemia    Hypertension    Memory difficulty    Osteoporosis    Uncontrolled diabetes mellitus type 2 without complications 62/83/6629   Vitamin D deficiency    Wears glasses    Past Surgical History:  Procedure Laterality Date   APPENDECTOMY     childhood   CATARACT EXTRACTION, BILATERAL  2014   COLONOSCOPY     in [place] in [place] N/A 05/18/2013   Procedure: EXCISION OF SEBACEOUS CYST BACK;  Surgeon: Merrie Roof, MD;  Location: [facility];  Service: General;  Laterality: N/A;   surgery to remove cyst on back  2010   x 2   TRANSTHORACIC ECHOCARDIOGRAM  06/17/2017    EF 60-65%.  Normal wall motion.  GR 1 DD.  No obvious valvular lesions.   TUBAL LIGATION       A IV Location/Drains/Wounds Patient Lines/Drains/Airways Status     Active Line/Drains/Airways     Name Placement date Placement time Site Days   Peripheral IV 12/09/20 20 G Left;Posterior Hand 12/09/20  1936  Hand  1   Peripheral IV 12/09/20 20 G 1" Right Antecubital 12/09/20  2030  Antecubital  1   Incision (Closed) 05/18/13 Back 05/18/13  1123  -- 2763            Intake/Output Last 24 hours  Intake/Output Summary (Last 24 hours) at 12/10/2020 2155 Last data filed at 12/10/2020 1909 Gross per 24 hour  Intake 950 ml  Output --  Net 950 ml    Labs/Imaging Results for orders placed or performed during the hospital encounter of 12/09/20 (from the past 48 hour(s))  Comprehensive metabolic panel     Status: Abnormal   Collection Time: 12/09/20  8:30 PM  Result Value Ref Range   Sodium 137 135 - 145 mmol/L   Potassium 4.7 3.5 - 5.1 mmol/L   Chloride  102 98 - 111 mmol/L   CO2 28 22 - 32 mmol/L   Glucose, Bld 214 (H) 70 - 99 mg/dL    Comment: Glucose reference range applies only to samples taken after fasting for at least 8 hours.   BUN 14 8 - 23 mg/dL   Creatinine, Ser 0.99 0.44 - 1.00 mg/dL   Calcium 9.2 8.9 - 10.3 mg/dL   Total Protein 6.5 6.5 - 8.1 g/dL   Albumin 3.3 (L) 3.5 - 5.0 g/dL   AST 25 15 - 41 U/L   ALT 25 0 - 44 U/L   Alkaline Phosphatase 65 38 - 126 U/L   Total Bilirubin 1.0 0.3 - 1.2 mg/dL   GFR, Estimated 57 (L) >60 mL/min    Comment: (NOTE) Calculated using the CKD-EPI Creatinine Equation (2021)    Anion gap 7 5 - 15    Comment: Performed at [facility], [place] [address] [place], [place] [address]  Lactic acid, plasma     Status: None   Collection Time:  12/09/20  8:30 PM  Result Value Ref Range   Lactic Acid, Venous 1.4 0.5 - 1.9 mmol/L    Comment: Performed at [facility], [place] [address] [place], [place] [address]  CBC WITH DIFFERENTIAL     Status: None   Collection Time: 12/09/20  8:30 PM  Result Value Ref Range   WBC 9.1 4.0 - 10.5 K/uL   RBC 3.91 3.87 - 5.11 MIL/uL   Hemoglobin 12.2 12.0 - 15.0 g/dL   HCT 37.7 36.0 - 46.0 %   MCV 96.4 80.0 - 100.0 fL   MCH 31.2 26.0 - 34.0 pg   MCHC 32.4 30.0 - 36.0 g/dL   RDW 12.7 11.5 - 15.5 %   Platelets 246 150 - 400 K/uL   nRBC 0.0 0.0 - 0.2 %   Neutrophils Relative % 78 %   Neutro Abs 7.1 1.7 - 7.7 K/uL   Lymphocytes Relative 13 %   Lymphs Abs 1.2 0.7 - 4.0 K/uL   Monocytes Relative 6 %   Monocytes Absolute 0.6 0.1 - 1.0 K/uL   Eosinophils Relative 2 %   Eosinophils Absolute 0.2 0.0 - 0.5 K/uL   Basophils Relative 1 %   Basophils Absolute 0.1 0.0 - 0.1 K/uL   Immature Granulocytes 0 %   Abs Immature Granulocytes 0.03 0.00 - 0.07 K/uL    Comment: Performed at [facility], [place] [address], [place], [place] [address]  Urinalysis, Routine w reflex microscopic     Status: Abnormal   Collection Time: 12/09/20  9:37 PM  Result Value Ref Range   Color, Urine YELLOW YELLOW   APPearance CLOUDY (A) CLEAR   Specific Gravity, Urine 1.015 1.005 - 1.030   pH 5.0 5.0 - 8.0   Glucose, UA 50 (A) NEGATIVE mg/dL   Hgb urine dipstick MODERATE (A) NEGATIVE   Bilirubin Urine NEGATIVE NEGATIVE   Ketones, ur 20 (A) NEGATIVE mg/dL   Protein, ur NEGATIVE NEGATIVE mg/dL   Nitrite NEGATIVE NEGATIVE   Leukocytes,Ua LARGE (A) NEGATIVE   RBC / HPF 11-20 0 - 5 RBC/hpf   WBC, UA >50 (H) 0 - 5 WBC/hpf   Bacteria, UA FEW (A) NONE SEEN   Squamous Epithelial / LPF 0-5 0 - 5   WBC Clumps PRESENT    Mucus PRESENT    Hyaline Casts, UA PRESENT     Comment: Performed at [facility], [place] [address] [place],  [place] [address]  Resp Panel by RT-PCR (Flu  A&B, Covid) Nasopharyngeal Swab     Status: None   Collection Time: 12/10/20 12:05 AM   Specimen: Nasopharyngeal Swab; Nasopharyngeal(NP) swabs in vial transport medium  Result Value Ref Range   SARS Coronavirus 2 by RT PCR NEGATIVE NEGATIVE    Comment: (NOTE) SARS-CoV-2 target nucleic acids are NOT DETECTED.  The SARS-CoV-2 RNA is generally detectable in upper respiratory specimens during the acute phase of infection. The lowest concentration of SARS-CoV-2 viral copies this assay can detect is 138 copies/mL. A negative result does not preclude SARS-Cov-2 infection and should not be used as the sole basis for treatment or other patient management decisions. A negative result may occur with  improper specimen collection/handling, submission of specimen other than nasopharyngeal swab, presence of viral mutation(s) within the areas targeted by this assay, and inadequate number of viral copies(<138 copies/mL). A negative result must be combined with clinical observations, patient history, and epidemiological information. The expected result is Negative.  Fact Sheet for Patients:  EntrepreneurPulse.com.au  Fact Sheet for Healthcare Providers:  IncredibleEmployment.be  This test is no t yet approved or cleared by the [place] FDA and  has been authorized for detection and/or diagnosis of SARS-CoV-2 by FDA under an Emergency Use Authorization (EUA). This EUA will remain  in effect (meaning this test can be used) for the duration of the COVID-19 declaration under Section 564(b)(1) of the Act, 21 U.S.C.section 360bbb-3(b)(1), unless the authorization is terminated  or revoked sooner.       Influenza A by PCR NEGATIVE NEGATIVE   Influenza B by PCR NEGATIVE NEGATIVE    Comment: (NOTE) The Xpert Xpress SARS-CoV-2/FLU/RSV plus assay is intended as an aid in the diagnosis of influenza from Nasopharyngeal swab specimens and should not be used as a  sole basis for treatment. Nasal washings and aspirates are unacceptable for Xpert Xpress SARS-CoV-2/FLU/RSV testing.  Fact Sheet for Patients: EntrepreneurPulse.com.au  Fact Sheet for Healthcare Providers: IncredibleEmployment.be  This test is not yet approved or cleared by the [place] FDA and has been authorized for detection and/or diagnosis of SARS-CoV-2 by FDA under an Emergency Use Authorization (EUA). This EUA will remain in effect (meaning this test can be used) for the duration of the COVID-19 declaration under Section 564(b)(1) of the Act, 21 U.S.C. section 360bbb-3(b)(1), unless the authorization is terminated or revoked.  Performed at [facility], [place] [address] [place], [place] [address]   CBG monitoring, ED     Status: Abnormal   Collection Time: 12/10/20 12:51 AM  Result Value Ref Range   Glucose-Capillary 249 (H) 70 - 99 mg/dL    Comment: Glucose reference range applies only to samples taken after fasting for at least 8 hours.  CBC     Status: Abnormal   Collection Time: 12/10/20  3:01 AM  Result Value Ref Range   WBC 8.8 4.0 - 10.5 K/uL   RBC 3.74 (L) 3.87 - 5.11 MIL/uL   Hemoglobin 11.6 (L) 12.0 - 15.0 g/dL   HCT 35.9 (L) 36.0 - 46.0 %   MCV 96.0 80.0 - 100.0 fL   MCH 31.0 26.0 - 34.0 pg   MCHC 32.3 30.0 - 36.0 g/dL   RDW 12.8 11.5 - 15.5 %   Platelets 250 150 - 400 K/uL   nRBC 0.0 0.0 - 0.2 %    Comment: Performed at [facility], [place] [address], [place], [place] [address]  Basic metabolic panel  Status: Abnormal   Collection Time: 12/10/20  3:01 AM  Result Value Ref Range   Sodium 134 (L) 135 - 145 mmol/L   Potassium 4.3 3.5 - 5.1 mmol/L   Chloride 99 98 - 111 mmol/L   CO2 24 22 - 32 mmol/L   Glucose, Bld 298 (H) 70 - 99 mg/dL    Comment: Glucose reference range applies only to samples taken after fasting for at least 8 hours.   BUN 14 8 - 23 mg/dL   Creatinine, Ser  0.81 0.44 - 1.00 mg/dL   Calcium 8.6 (L) 8.9 - 10.3 mg/dL   GFR, Estimated >60 >60 mL/min    Comment: (NOTE) Calculated using the CKD-EPI Creatinine Equation (2021)    Anion gap 11 5 - 15    Comment: Performed at [facility], [place] [address], [place], [place] [address]  Magnesium     Status: None   Collection Time: 12/10/20  3:01 AM  Result Value Ref Range   Magnesium 1.9 1.7 - 2.4 mg/dL    Comment: Performed at [facility], [place] [address], [place], [place] [address]  Phosphorus     Status: None   Collection Time: 12/10/20  3:01 AM  Result Value Ref Range   Phosphorus 3.2 2.5 - 4.6 mg/dL    Comment: Performed at [facility], [place] [address] [place], [place] [address]  CBG monitoring, ED     Status: Abnormal   Collection Time: 12/10/20  8:34 AM  Result Value Ref Range   Glucose-Capillary 253 (H) 70 - 99 mg/dL    Comment: Glucose reference range applies only to samples taken after fasting for at least 8 hours.  CBG monitoring, ED     Status: Abnormal   Collection Time: 12/10/20 11:39 AM  Result Value Ref Range   Glucose-Capillary 171 (H) 70 - 99 mg/dL    Comment: Glucose reference range applies only to samples taken after fasting for at least 8 hours.  CBG monitoring, ED     Status: Abnormal   Collection Time: 12/10/20  5:43 PM  Result Value Ref Range   Glucose-Capillary 158 (H) 70 - 99 mg/dL    Comment: Glucose reference range applies only to samples taken after fasting for at least 8 hours.   CT HEAD WO CONTRAST  Result Date: 12/09/2020 CLINICAL DATA:  Altered mental status, fever, dementia EXAM: CT HEAD WITHOUT CONTRAST TECHNIQUE: Contiguous axial images were obtained from the base of the skull through the vertex without intravenous contrast. COMPARISON:  12/03/2020 FINDINGS: Brain: No evidence of acute infarction, hemorrhage, hydrocephalus, extra-axial collection or mass lesion/mass effect. Mild age related atrophy.  Subcortical white matter and periventricular small vessel ischemic changes. Basal ganglia calcifications. Vascular: Intracranial atherosclerosis. Skull: Normal. Negative for fracture or focal lesion. Sinuses/Orbits: The visualized paranasal sinuses are essentially clear. The mastoid air cells are unopacified. Other: None. IMPRESSION: No evidence of acute intracranial abnormality. Mild atrophy with small vessel ischemic changes. Electronically Signed   By: Julian Hy M.D.   On: 12/09/2020 23:09   CT Abdomen Pelvis W Contrast  Result Date: 12/09/2020 CLINICAL DATA:  Bowel obstruction suspected. Altered mental status. No bowel movement in several days. EXAM: CT ABDOMEN AND PELVIS WITH CONTRAST TECHNIQUE: Multidetector CT imaging of the abdomen and pelvis was performed using the standard protocol following bolus administration of intravenous contrast. CONTRAST:  47mL OMNIPAQUE IOHEXOL 350 MG/ML SOLN COMPARISON:  CT renal 12/03/2020 FINDINGS: Lower chest: Bilateral lower lobe subsegmental atelectasis. No  acute abnormality. Small hiatal hernia. Hepatobiliary: No focal liver abnormality. No gallstones, gallbladder wall thickening, or pericholecystic fluid. No biliary dilatation. Pancreas: No focal lesion. Normal pancreatic contour. No surrounding inflammatory changes. No main pancreatic ductal dilatation. Spleen: Normal in size without focal abnormality. Adrenals/Urinary Tract: No adrenal nodule bilaterally. Bilateral kidneys enhance symmetrically. No hydronephrosis. No hydroureter. Foley catheter tip and balloon terminate within the urinary bladder lumen. Urinary bladder is decompressed. Urinary bladder wall thickening and mucosal hyperemia. On delayed imaging, there is no urothelial wall thickening and there are no filling defects in the opacified portions of the bilateral collecting systems or ureters. Stomach/Bowel: Stomach is within normal limits. No evidence of bowel wall thickening or dilatation. No  pneumatosis. Appendix appears normal. Vascular/Lymphatic: No abdominal aorta or iliac aneurysm. Moderate atherosclerotic plaque of the aorta and its branches. No abdominal, pelvic, or inguinal lymphadenopathy. Reproductive: Coarsely calcified lesions within the uterus likely with present uterine fibroids with the largest measuring up to 3.3 cm. Uterus and bilateral adnexa are unremarkable. Other: No intraperitoneal free fluid. No intraperitoneal free gas. No organized fluid collection. Musculoskeletal: No abdominal wall hernia or abnormality. No suspicious lytic or blastic osseous lesions. No acute displaced fracture. Multilevel degenerative changes of the spine. Grade 1 anterolisthesis of L4 on L5. IMPRESSION: 1. Urinary bladder findings suggestive of infection. Correlate with urinalysis. 2. Small hiatal hernia. 3. Likely degenerative uterine fibroids. 4. No bowel obstruction.  No constipation. Electronically Signed   By: Iven Finn M.D.   On: 12/09/2020 23:16   DG Chest Port 1 View  Result Date: 12/09/2020 CLINICAL DATA:  Altered mental status, fever EXAM: PORTABLE CHEST 1 VIEW COMPARISON:  12/03/2020 FINDINGS: Low lung volumes with mild bibasilar atelectasis. No focal consolidation. No pleural effusion or pneumothorax. The heart is normal in size. IMPRESSION: Low lung volumes with mild bibasilar atelectasis. Electronically Signed   By: Julian Hy M.D.   On: 12/09/2020 19:38    Pending Labs Unresulted Labs (From admission, onward)     Start     Ordered   12/10/20 1608  Culture, blood (routine x 2)  BLOOD CULTURE X 2,   R (with TIMED occurrences)      12/10/20 1607   12/09/20 2330  Urine Culture  Once,   STAT       Question:  Indication  Answer:  Altered mental status (if no other cause identified)   12/09/20 2330            Vitals/Pain Today's Vitals   12/10/20 1600 12/10/20 1700 12/10/20 2000 12/10/20 2130  BP: (!) 105/55 (!) 146/65 100/61 (!) 158/74  Pulse: 87 (!) 108 99  (!) 110  Resp: 14 19 15 14   Temp:      TempSrc:      SpO2: 100% 100% (!) 20% (!) 19%  Weight:  72.6 kg      Isolation Precautions No active isolations  Medications Medications  enoxaparin (LOVENOX) injection 40 mg (40 mg Subcutaneous Given 12/10/20 0053)  0.9 %  sodium chloride infusion ( Intravenous Rate/Dose Change 12/10/20 1609)  insulin aspart (novoLOG) injection 0-9 Units (2 Units Subcutaneous Given 12/10/20 1811)  promethazine (PHENERGAN) 12.5 mg in sodium chloride 0.9 % 50 mL IVPB (0 mg Intravenous Stopped 12/10/20 1909)  acetaminophen (TYLENOL) tablet 650 mg (has no administration in time range)  polyethylene glycol (MIRALAX / GLYCOLAX) packet 17 g (has no administration in time range)  melatonin tablet 3 mg (has no administration in time range)  insulin glargine-yfgn (SEMGLEE) injection 12  Units (has no administration in time range)  piperacillin-tazobactam (ZOSYN) IVPB 3.375 g (has no administration in time range)  pantoprazole (PROTONIX) injection 40 mg (has no administration in time range)  sodium chloride 0.9 % bolus 1,000 mL (0 mLs Intravenous Stopped 12/09/20 2145)  iohexol (OMNIPAQUE) 350 MG/ML injection 75 mL (75 mLs Intravenous Contrast Given 12/09/20 2230)  LORazepam (ATIVAN) injection 1 mg (1 mg Intravenous Given 12/09/20 2148)  ondansetron (ZOFRAN) injection 4 mg (4 mg Intravenous Given 12/09/20 2355)  piperacillin-tazobactam (ZOSYN) IVPB 3.375 g (0 g Intravenous Stopped 12/10/20 1902)    Mobility non-ambulatory High fall risk   Focused Assessments    R Recommendations: See Admitting Provider Note  Report given to:   Additional Notes:

## 2020-12-11 ENCOUNTER — Encounter (HOSPITAL_COMMUNITY): Payer: Self-pay | Admitting: Internal Medicine

## 2020-12-11 ENCOUNTER — Institutional Professional Consult (permissible substitution): Payer: Medicare Other | Admitting: Diagnostic Neuroimaging

## 2020-12-11 DIAGNOSIS — I10 Essential (primary) hypertension: Secondary | ICD-10-CM

## 2020-12-11 DIAGNOSIS — T83511A Infection and inflammatory reaction due to indwelling urethral catheter, initial encounter: Secondary | ICD-10-CM | POA: Diagnosis not present

## 2020-12-11 DIAGNOSIS — K920 Hematemesis: Secondary | ICD-10-CM

## 2020-12-11 DIAGNOSIS — Z794 Long term (current) use of insulin: Secondary | ICD-10-CM

## 2020-12-11 DIAGNOSIS — A4152 Sepsis due to Pseudomonas: Secondary | ICD-10-CM

## 2020-12-11 DIAGNOSIS — E1169 Type 2 diabetes mellitus with other specified complication: Secondary | ICD-10-CM

## 2020-12-11 DIAGNOSIS — K219 Gastro-esophageal reflux disease without esophagitis: Secondary | ICD-10-CM

## 2020-12-11 DIAGNOSIS — I639 Cerebral infarction, unspecified: Secondary | ICD-10-CM | POA: Diagnosis not present

## 2020-12-11 DIAGNOSIS — F0390 Unspecified dementia without behavioral disturbance: Secondary | ICD-10-CM

## 2020-12-11 LAB — CBC WITH DIFFERENTIAL/PLATELET
Abs Immature Granulocytes: 0.09 10*3/uL — ABNORMAL HIGH (ref 0.00–0.07)
Basophils Absolute: 0.1 10*3/uL (ref 0.0–0.1)
Basophils Relative: 1 %
Eosinophils Absolute: 0.1 10*3/uL (ref 0.0–0.5)
Eosinophils Relative: 1 %
HCT: 35.5 % — ABNORMAL LOW (ref 36.0–46.0)
Hemoglobin: 11.6 g/dL — ABNORMAL LOW (ref 12.0–15.0)
Immature Granulocytes: 1 %
Lymphocytes Relative: 18 %
Lymphs Abs: 1.8 10*3/uL (ref 0.7–4.0)
MCH: 31 pg (ref 26.0–34.0)
MCHC: 32.7 g/dL (ref 30.0–36.0)
MCV: 94.9 fL (ref 80.0–100.0)
Monocytes Absolute: 0.6 10*3/uL (ref 0.1–1.0)
Monocytes Relative: 6 %
Neutro Abs: 7.4 10*3/uL (ref 1.7–7.7)
Neutrophils Relative %: 73 %
Platelets: 233 10*3/uL (ref 150–400)
RBC: 3.74 MIL/uL — ABNORMAL LOW (ref 3.87–5.11)
RDW: 12.7 % (ref 11.5–15.5)
WBC: 10.1 10*3/uL (ref 4.0–10.5)
nRBC: 0 % (ref 0.0–0.2)

## 2020-12-11 LAB — URINE CULTURE: Culture: >=100000 — AB

## 2020-12-11 LAB — GLUCOSE, CAPILLARY
Glucose-Capillary: 217 mg/dL — ABNORMAL HIGH (ref 70–99)
Glucose-Capillary: 235 mg/dL — ABNORMAL HIGH (ref 70–99)
Glucose-Capillary: 110 mg/dL — ABNORMAL HIGH (ref 70–99)
Glucose-Capillary: 124 mg/dL — ABNORMAL HIGH (ref 70–99)

## 2020-12-11 LAB — BASIC METABOLIC PANEL
Anion gap: 10 (ref 5–15)
BUN: 11 mg/dL (ref 8–23)
CO2: 24 mmol/L (ref 22–32)
Calcium: 8.6 mg/dL — ABNORMAL LOW (ref 8.9–10.3)
Chloride: 103 mmol/L (ref 98–111)
Creatinine, Ser: 0.86 mg/dL (ref 0.44–1.00)
GFR, Estimated: >60 mL/min (ref >60)
Glucose, Bld: 221 mg/dL — ABNORMAL HIGH (ref 70–99)
Potassium: 3.7 mmol/L (ref 3.5–5.1)
Sodium: 137 mmol/L (ref 135–145)

## 2020-12-11 LAB — MAGNESIUM: Magnesium: 1.8 mg/dL (ref 1.7–2.4)

## 2020-12-11 MED ORDER — ASPIRIN 81 MG PO CHEW
81.0000 mg | CHEWABLE_TABLET | Freq: Every day | ORAL | Status: DC
  Administered 2020-12-13 – 2020-12-20 (×8): 81 mg via ORAL
  Filled 2020-12-11 (×9): qty 1

## 2020-12-11 MED ORDER — ENOXAPARIN SODIUM 40 MG/0.4ML IJ SOSY: 40.0000 mg | PREFILLED_SYRINGE | INTRAMUSCULAR | Status: DC

## 2020-12-11 MED ORDER — VITAMIN B-12 1000 MCG PO TABS
1000.0000 ug | ORAL_TABLET | Freq: Every day | ORAL | Status: DC
  Administered 2020-12-13 – 2020-12-20 (×8): 1000 ug via ORAL
  Filled 2020-12-11 (×9): qty 1

## 2020-12-11 MED ORDER — AMLODIPINE BESYLATE 5 MG PO TABS: 2.5000 mg | ORAL_TABLET | Freq: Every day | ORAL | Status: DC

## 2020-12-11 MED ORDER — SODIUM CHLORIDE 0.9 % IV SOLN: INTRAVENOUS | Status: AC

## 2020-12-11 MED ORDER — BISACODYL 10 MG RE SUPP
10.0000 mg | Freq: Once | RECTAL | Status: AC
  Administered 2020-12-11: 10 mg via RECTAL
  Filled 2020-12-11: qty 1

## 2020-12-11 MED ORDER — MEMANTINE HCL 10 MG PO TABS
5.0000 mg | ORAL_TABLET | Freq: Every day | ORAL | Status: DC
  Administered 2020-12-13 – 2020-12-20 (×8): 5 mg via ORAL
  Filled 2020-12-11 (×9): qty 1

## 2020-12-11 NOTE — Evaluation (Signed)
Physical Therapy Evaluation Patient Details Name: Stacey Fuentes MRN: [id number] DOB: 01-Jun-1935 Today's Date: 12/11/2020  History of Present Illness  85 yo female admitted with UTI, sepsis. Hx of dementia, CVA (10/2020), DM, DKA, essential tremor  Clinical Impression  On eval, pt was Mod A for mobility. She stood and took several side steps along bedside with use of RW. She was cooperative during session. Pt is minimally responsive besides the occasional "mmm hmm" when asked a question. She requires repeated multimodal cueing and encouragement for participation. No family was present during session. Will follow and progress activity as tolerated. At this time, PT recommendation is for SNF however, per chart review, daughter prefers for pt to return home with her. If pt returns home, recommend HHPT if daughter is agreeable.        Recommendations for follow up therapy are one component of a multi-disciplinary discharge planning process, led by the attending physician.  Recommendations may be updated based on patient status, additional functional criteria and insurance authorization.  Follow Up Recommendations Skilled nursing-short term rehab (<3 hours/day) (HHPT if daughter prefers for her to return home)    Assistance Recommended at Discharge Frequent or constant Supervision/Assistance  Functional Status Assessment Patient has had a recent decline in their functional status and demonstrates the ability to make significant improvements in function in a reasonable and predictable amount of time.  Equipment Recommendations  None recommended by PT    Recommendations for Other Services OT consult     Precautions / Restrictions Precautions Precautions: Fall Precaution Comments: mittens-nursing reports she has been agitated/combative at times Restrictions Weight Bearing Restrictions: No      Mobility  Bed Mobility Overal bed mobility: Needs Assistance Bed Mobility: Rolling;Supine to  Sit;Sit to Supine Rolling: Mod assist   Supine to sit: Mod assist Sit to supine: Mod assist   General bed mobility comments: Pt tends to lay on R side. Assist for trunk and bil LEs. Increased time. Multimodal cueing and encouragement required.    Transfers Overall transfer level: Needs assistance Equipment used: Rolling walker (2 wheels) Transfers: Sit to/from Stand Sit to Stand: Mod assist           General transfer comment: mod assist to power up and steady pt pulling on RW. multimodal cueing and encouragement required.    Ambulation/Gait Ambulation/Gait assistance: Min assist   Assistive device: Rolling walker (2 wheels)       General Gait Details: side steps along side of bed with assist to steady pt and maneuver RW.  Stairs            Wheelchair Mobility    Modified Rankin (Stroke Patients Only)       Balance Overall balance assessment: Needs assistance         Standing balance support: Bilateral upper extremity supported Standing balance-Leahy Scale: Poor                               Pertinent Vitals/Pain Pain Assessment: Faces Faces Pain Scale: No hurt Breathing: normal Negative Vocalization: none Facial Expression: smiling or inexpressive Body Language: relaxed Consolability: no need to console PAINAD Score: 0    Home Living Family/patient expects to be discharged to:: Private residence Living Arrangements: Children Available Help at Discharge: Family;Available 24 hours/day Type of Home: House Home Access: Stairs to enter Entrance Stairs-Rails: Psychiatric nurse of Steps: 4-5   Home Layout: One level Home Equipment: Cane - single point;Rolling  Walker (2 wheels);Shower seat      Prior Function Prior Level of Function : Needs assist             Mobility Comments: info taken from previous admission: daughter reports occasional use of cane for ambulation ADLs Comments: infor taken from previous  admission: daughter reports she has to help pt initiate self feeding and teeth brushing. she is able to bathe with supervision/setup. requires assist for dressing tub transfers. she is able to complet toileting mod I but has episodes of incontinence     Hand Dominance   Dominant Hand: Right    Extremity/Trunk Assessment   Upper Extremity Assessment Upper Extremity Assessment: Defer to OT evaluation    Lower Extremity Assessment Lower Extremity Assessment: Generalized weakness;Difficult to assess due to impaired cognition RLE Deficits / Details: able to weightbear LLE Deficits / Details: able to weightbear    Cervical / Trunk Assessment Cervical / Trunk Assessment: Normal  Communication   Communication: HOH;Expressive difficulties  Cognition Arousal/Alertness: Awake/alert Behavior During Therapy: Flat affect Overall Cognitive Status: History of cognitive impairments - at baseline                                 General Comments: dementia at baseline. Follows simple commands with repeated multimodal cueing and increased time. she shrugged shoulders when asked about current location        General Comments      Exercises     Assessment/Plan    PT Assessment Patient needs continued PT services  PT Problem List Decreased strength;Decreased activity tolerance;Decreased balance;Decreased mobility;Decreased cognition;Decreased knowledge of use of DME;Decreased safety awareness;Decreased knowledge of precautions       PT Treatment Interventions DME instruction;Gait training;Therapeutic activities;Patient/family education;Therapeutic exercise;Balance training;Functional mobility training    PT Goals (Current goals can be found in the Care Plan section)  Acute Rehab PT Goals Patient Stated Goal: pt unable to state PT Goal Formulation: Patient unable to participate in goal setting Time For Goal Achievement: 12/25/20 Potential to Achieve Goals: Fair     Frequency Min 3X/week   Barriers to discharge        Co-evaluation               AM-PAC PT "6 Clicks" Mobility  Outcome Measure Help needed turning from your back to your side while in a flat bed without using bedrails?: A Lot Help needed moving from lying on your back to sitting on the side of a flat bed without using bedrails?: A Lot Help needed moving to and from a bed to a chair (including a wheelchair)?: A Lot Help needed standing up from a chair using your arms (e.g., wheelchair or bedside chair)?: A Lot Help needed to walk in hospital room?: A Lot Help needed climbing 3-5 steps with a railing? : Total 6 Click Score: 11    End of Session   Activity Tolerance: Patient tolerated treatment well Patient left: in bed;with call bell/phone within reach;with nursing/sitter in room (NT left in room with pt)   PT Visit Diagnosis: Muscle weakness (generalized) (M62.81);Other abnormalities of gait and mobility (R26.89)    Time: 1100-1117 PT Time Calculation (min) (ACUTE ONLY): 17 min   Charges:   PT Evaluation $PT Eval Moderate Complexity: 1 Mod             Doreatha Massed, PT Acute Rehabilitation  Office: [phone] Pager: [phone]

## 2020-12-11 NOTE — Consult Note (Signed)
Referring Provider: Dr. Ouida Sills, [facility] Primary Care Physician:  Harlan Stains, MD Primary Gastroenterologist:  Dr. Olevia Perches previously  Reason for Consultation:  Hematemesis  HPI: Stacey Fuentes is a 85 y.o. female with PMH of dementia, type 2 DM, HLD, recent CVA, CKD, HTN who presented to [facility] hospital with AMS.  Diagnosed with UTI and sepsis related to that.  Started on abx.  CT scan of the abdomen and pelvis with contrast showed the following:  IMPRESSION: 1. Urinary bladder findings suggestive of infection. Correlate with urinalysis. 2. Small hiatal hernia. 3. Likely degenerative uterine fibroids. 4. No bowel obstruction.  No constipation.  GI was consulted for reports of hematemesis or CGE.  The patient was in [facility] ED on 10/29 and the daughter reported to the hospitalist here that while her mother was there she had multiple episodes of brownish colored emesis.  She has not no further emesis while she is here.  No reported melena.  There was no documentation of this by providers or staff during that visit.  Her Hgb is stable at 11.6 grams.  She is on Nexium 40 mg daily at home and is on pantoprazole 40 mg IV BID here.    EGD 03/2014:  Mild antral gastritis.  Surgical [P], antrum, biopsy - CHRONIC ACTIVE GASTRITIS. 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 - NO INTESTINAL METAPLASIA OR MALIGNANCY IDENTIFIED.  Colonoscopy 02/2012 showed one polyp that was removed.  Surgical [P], colon @ 70 cm - TUBULAR ADENOMA. NO HIGH GRADE DYSPLASIA OR MALIGNANCY IDENTIFIED.   Past Medical History:  Diagnosis Date   Benign essential tremor    Cataract    CKD (chronic kidney disease), stage II    Dementia ([name])    Diabetes mellitus ([name])    type2   Full dentures    Hyperlipidemia    Hypertension    Memory difficulty    Osteoporosis    Uncontrolled diabetes mellitus type 2 without complications 46/27/0350   Vitamin D deficiency    Wears glasses     Past Surgical  History:  Procedure Laterality Date   APPENDECTOMY     childhood   CATARACT EXTRACTION, BILATERAL  2014   COLONOSCOPY     in [place] in [place] N/A 05/18/2013   Procedure: EXCISION OF SEBACEOUS CYST BACK;  Surgeon: Merrie Roof, MD;  Location: [facility];  Service: General;  Laterality: N/A;   surgery to remove cyst on back  2010   x 2   TRANSTHORACIC ECHOCARDIOGRAM  06/17/2017    EF 60-65%.  Normal wall motion.  GR 1 DD.  No obvious valvular lesions.   TUBAL LIGATION      Prior to Admission medications   Medication Sig Start Date End Date Taking? Authorizing Provider  amLODipine (NORVASC) 2.5 MG tablet Take 1 tablet (2.5 mg total) by mouth daily. 12/01/20  Yes Ghimire, Henreitta Leber, MD  aspirin 81 MG tablet Take 1 tablet (81 mg total) by mouth daily. 12/01/20  Yes Ghimire, Henreitta Leber, MD  atorvastatin (LIPITOR) 40 MG tablet Take 1 tablet (40 mg total) by mouth daily. 12/01/20  Yes Ghimire, Henreitta Leber, MD  esomeprazole (NEXIUM) 40 MG capsule Take 1 capsule (40 mg total) by mouth daily. 12/01/20  Yes Ghimire, Henreitta Leber, MD  insulin glargine (LANTUS) 100 UNIT/ML Solostar Pen Inject 12 Units into the skin daily. 12/01/20  Yes Ghimire, Henreitta Leber, MD  memantine (NAMENDA) 5 MG tablet Take 1 tablet (  5 mg total) by mouth daily. 12/01/20  Yes Ghimire, Henreitta Leber, MD  Multiple Vitamin (MULTIVITAMIN WITH MINERALS) TABS tablet Take 1 tablet by mouth daily. 12/01/20  Yes Ghimire, Henreitta Leber, MD  blood glucose meter kit and supplies Dispense based on patient and insurance preference. Use up to four times daily as directed. (FOR ICD-10 E10.9, E11.9). 12/03/20   Veryl Speak, MD  calcium carbonate (OSCAL) 1500 (600 Ca) MG TABS tablet Take 1 tablet (1,500 mg total) by mouth daily with breakfast. Patient not taking: No sig reported 12/01/20   Jonetta Osgood, MD  insulin aspart (NOVOLOG) 100 UNIT/ML injection Inject 0-10 Units into the skin as directed. 0-9 Units,  Subcutaneous, 3 times daily with meals: 0-69 implement hypoglycemic measures, 70-150=0 units, 151-199=2 units, 200-249=4 units, 250-299=6 units, 300-349=8 units, 350-399=10 units, [phone] Notify MD 12/03/20   Veryl Speak, MD  Insulin Pen Needle 32G X 4 MM MISC 1 each by Does not apply route as needed. 12/01/20   Ghimire, Henreitta Leber, MD  latanoprost (XALATAN) 0.005 % ophthalmic solution Place 1 drop into both eyes every evening. Patient not taking: No sig reported 12/01/20   Ghimire, Henreitta Leber, MD  Omega-3 Fatty Acids (FISH OIL) 1000 MG CAPS Take 1 capsule (1,000 mg total) by mouth daily. Patient not taking: No sig reported 12/01/20   Jonetta Osgood, MD  vitamin B-12 (CYANOCOBALAMIN) 1000 MCG tablet Take 1 tablet (1,000 mcg total) by mouth daily. Patient not taking: No sig reported 12/01/20   Jonetta Osgood, MD    Current Facility-Administered Medications  Medication Dose Route Frequency Provider Last Rate Last Admin   0.9 %  sodium chloride infusion   Intravenous Continuous Eugenie Filler, MD 75 mL/hr at 12/11/20 1219 New Bag at 12/11/20 1219   acetaminophen (TYLENOL) tablet 650 mg  650 mg Oral Q6H PRN Irene Pap N, DO       amLODipine (NORVASC) tablet 2.5 mg  2.5 mg Oral Daily Eugenie Filler, MD       aspirin chewable tablet 81 mg  81 mg Oral Daily Eugenie Filler, MD       enoxaparin (LOVENOX) injection 40 mg  40 mg Subcutaneous Q24H Hall, Carole N, DO   40 mg at 12/10/20 2313   insulin aspart (novoLOG) injection 0-9 Units  0-9 Units Subcutaneous TID WC Irene Pap N, DO   3 Units at 12/11/20 1218   insulin glargine-yfgn (SEMGLEE) injection 12 Units  12 Units Subcutaneous Daily Richarda Osmond, MD   12 Units at 12/11/20 0931   melatonin tablet 3 mg  3 mg Oral QHS PRN Kayleen Memos, DO       memantine (NAMENDA) tablet 5 mg  5 mg Oral Daily Eugenie Filler, MD       pantoprazole (PROTONIX) injection 40 mg  40 mg Intravenous Q12H Richarda Osmond, MD   40 mg at  12/11/20 0932   piperacillin-tazobactam (ZOSYN) IVPB 3.375 g  3.375 g Intravenous Q8H Wofford, Drew A, RPH 12.5 mL/hr at 12/11/20 0501 3.375 g at 12/11/20 0501   polyethylene glycol (MIRALAX / GLYCOLAX) packet 17 g  17 g Oral Daily PRN Irene Pap N, DO       promethazine (PHENERGAN) 12.5 mg in sodium chloride 0.9 % 50 mL IVPB  12.5 mg Intravenous Q6H PRN Irene Pap N, DO   Stopped at 12/10/20 1909   vitamin B-12 (CYANOCOBALAMIN) tablet 1,000 mcg  1,000 mcg Oral Daily Eugenie Filler, MD  Allergies as of 12/09/2020 - Review Complete 12/09/2020  Allergen Reaction Noted   Metformin and related Nausea Only 01/05/2012   Saxagliptin Nausea Only 09/05/2019    Family History  Problem Relation Age of Onset   Stroke Mother    Diabetes Mother    Tuberculosis Mother    Other Father        homicide   Colon cancer Neg Hx    Esophageal cancer Neg Hx    Rectal cancer Neg Hx    Stomach cancer Neg Hx     Social History   Socioeconomic History   Marital status: Widowed    Spouse name: Not on file   Number of children: 7   Years of education: Not on file   Highest education level: 8th grade  Occupational History   Occupation: retired    Comment: sewing Glass blower/designer  Tobacco Use   Smoking status: Never   Smokeless tobacco: Never  Vaping Use   Vaping Use: Never used  Substance and Sexual Activity   Alcohol use: No   Drug use: No   Sexual activity: Not on file  Other Topics Concern   Not on file  Social History Narrative   09/06/19 lives with dgtr, [name]   Very little caffeine   Social Determinants of Health   Financial Resource Strain: Not on file  Food Insecurity: Not on file  Transportation Needs: Not on file  Physical Activity: Not on file  Stress: Not on file  Social Connections: Not on file  Intimate Partner Violence: Not on file    Review of Systems: ROS is O/W negative except as mentioned in HPI.  Physical Exam: Vital signs in last 24 hours: Temp:   [98.9 F (37.2 C)-99.5 F (37.5 C)] 98.9 F (37.2 C) (11/01 0830) Pulse Rate:  [87-110] 110 (11/01 0830) Resp:  [14-20] 16 (11/01 1011) BP: (99-183)/(52-97) 144/52 (11/01 1011) SpO2:  [19 %-100 %] 100 % (11/01 0830) Weight:  [66.7 kg-72.6 kg] 66.7 kg (10/31 2250) Last BM Date:  (PTA) General:  Alert but chronically ill-appearing, almost non-verbal. Head:  Normocephalic and atraumatic. Eyes:  Sclera clear, no icterus.  Conjunctiva pink. Ears:  Normal auditory acuity. Mouth:  No deformity or lesions.   Lungs:  Clear throughout to auscultation.   No wheezes, crackles, or rhonchi.  Heart:  Regular rate and rhythm; no murmurs, clicks, rubs,or gallops. Abdomen:  Soft, non-distended.  BS present.  Non-tender. Msk:  Symmetrical without gross deformities. Pulses:  Normal pulses noted. Extremities:  Without clubbing or edema. Neurologic:  Alert but almost non-verbal at the time of my visit.  Has mittens on her hands. Skin:  Intact without significant lesions or rashes.  Intake/Output from previous day: 10/31 0701 - 11/01 0700 In: 2245.3 [I.V.:2095.3; IV Piggyback:150] Out: 100 [Urine:100]  Lab Results: Recent Labs    12/09/20 2030 12/10/20 0301 12/11/20 0849  WBC 9.1 8.8 10.1  HGB 12.2 11.6* 11.6*  HCT 37.7 35.9* 35.5*  PLT 246 250 233   BMET Recent Labs    12/09/20 2030 12/10/20 0301 12/11/20 0849  NA 137 134* 137  K 4.7 4.3 3.7  CL 102 99 103  CO2 _0 GLUCOSE 214* 298* 221*  BUN _1 CREATININE 0.99 0.81 0.86  CALCIUM 9.2 8.6* 8.6*   LFT Recent Labs    12/09/20 2030  PROT 6.5  ALBUMIN 3.3*  AST 25  ALT 25  ALKPHOS 65  BILITOT 1.0   Studies/Results: CT HEAD  WO CONTRAST  Result Date: 12/09/2020 CLINICAL DATA:  Altered mental status, fever, dementia EXAM: CT HEAD WITHOUT CONTRAST TECHNIQUE: Contiguous axial images were obtained from the base of the skull through the vertex without intravenous contrast. COMPARISON:  12/03/2020 FINDINGS: Brain: No  evidence of acute infarction, hemorrhage, hydrocephalus, extra-axial collection or mass lesion/mass effect. Mild age related atrophy. Subcortical white matter and periventricular small vessel ischemic changes. Basal ganglia calcifications. Vascular: Intracranial atherosclerosis. Skull: Normal. Negative for fracture or focal lesion. Sinuses/Orbits: The visualized paranasal sinuses are essentially clear. The mastoid air cells are unopacified. Other: None. IMPRESSION: No evidence of acute intracranial abnormality. Mild atrophy with small vessel ischemic changes. Electronically Signed   By: Julian Hy M.D.   On: 12/09/2020 23:09   CT Abdomen Pelvis W Contrast  Result Date: 12/09/2020 CLINICAL DATA:  Bowel obstruction suspected. Altered mental status. No bowel movement in several days. EXAM: CT ABDOMEN AND PELVIS WITH CONTRAST TECHNIQUE: Multidetector CT imaging of the abdomen and pelvis was performed using the standard protocol following bolus administration of intravenous contrast. CONTRAST:  65m OMNIPAQUE IOHEXOL 350 MG/ML SOLN COMPARISON:  CT renal 12/03/2020 FINDINGS: Lower chest: Bilateral lower lobe subsegmental atelectasis. No acute abnormality. Small hiatal hernia. Hepatobiliary: No focal liver abnormality. No gallstones, gallbladder wall thickening, or pericholecystic fluid. No biliary dilatation. Pancreas: No focal lesion. Normal pancreatic contour. No surrounding inflammatory changes. No main pancreatic ductal dilatation. Spleen: Normal in size without focal abnormality. Adrenals/Urinary Tract: No adrenal nodule bilaterally. Bilateral kidneys enhance symmetrically. No hydronephrosis. No hydroureter. Foley catheter tip and balloon terminate within the urinary bladder lumen. Urinary bladder is decompressed. Urinary bladder wall thickening and mucosal hyperemia. On delayed imaging, there is no urothelial wall thickening and there are no filling defects in the opacified portions of the bilateral  collecting systems or ureters. Stomach/Bowel: Stomach is within normal limits. No evidence of bowel wall thickening or dilatation. No pneumatosis. Appendix appears normal. Vascular/Lymphatic: No abdominal aorta or iliac aneurysm. Moderate atherosclerotic plaque of the aorta and its branches. No abdominal, pelvic, or inguinal lymphadenopathy. Reproductive: Coarsely calcified lesions within the uterus likely with present uterine fibroids with the largest measuring up to 3.3 cm. Uterus and bilateral adnexa are unremarkable. Other: No intraperitoneal free fluid. No intraperitoneal free gas. No organized fluid collection. Musculoskeletal: No abdominal wall hernia or abnormality. No suspicious lytic or blastic osseous lesions. No acute displaced fracture. Multilevel degenerative changes of the spine. Grade 1 anterolisthesis of L4 on L5. IMPRESSION: 1. Urinary bladder findings suggestive of infection. Correlate with urinalysis. 2. Small hiatal hernia. 3. Likely degenerative uterine fibroids. 4. No bowel obstruction.  No constipation. Electronically Signed   By: MIven FinnM.D.   On: 12/09/2020 23:16   DG Chest Port 1 View  Result Date: 12/09/2020 CLINICAL DATA:  Altered mental status, fever EXAM: PORTABLE CHEST 1 VIEW COMPARISON:  12/03/2020 FINDINGS: Low lung volumes with mild bibasilar atelectasis. No focal consolidation. No pleural effusion or pneumothorax. The heart is normal in size. IMPRESSION: Low lung volumes with mild bibasilar atelectasis. Electronically Signed   By: SJulian HyM.D.   On: 12/09/2020 19:38    IMPRESSION:  *Hematemesis/CGE:  This was reported to the hospitalist by the daughter.  [facility] but nothing was documented by any providers or staff.  No further hematemesis since then to our knowledge.  Her Hgb is stable and essentially normal at 11.6 grams.  *History of Hpylori infection on gastric biopsies from 2016.  Looks like we  recommended treatment but she never followed up after that. *Sepsis and AMS due to UTI:  On abx per primary service. *Recent CVA *Dementia  PLAN: -Will let Dr. Lorenso Courier discuss with the patient's daughter as she was not present when I saw the patient.  She has expressed dissatisfaction with her mother's care recently and was speaking to the nurse manager when I went to see the patient. -With stable Hgb and no further hematemesis/CGE may recommend observation, monitor Hgb, and PPI (is on pantoprazole 40 mg IV BID here and was on Nexium 40 mg daily at home). -Will check stool for Hpylori Ag and if positive then will treat.  Laban Emperor. Debany Vantol  12/11/2020, 12:34 PM

## 2020-12-11 NOTE — TOC Initial Note (Signed)
Transition of Care [facility]) - Initial/Assessment Note    Patient Details  Name: Stacey Fuentes MRN: [id number] Date of Birth: August 14, 1935  [facility]) CM/SW Contact:    Dessa Phi, RN Phone Number: 12/11/2020, 1:57 PM  Clinical Narrative: Stacey Fuentes to dtr Stacey Fuentes about d/c plans-d/c plan return back home w/HHC-[employer] rep Levada Dy already following-HHRN/PT/OT/aide/CSW;[employer] rw-[place] will deliver to rm prior d/c.dtr aware to take home on own);PTAR @ d/c-confirmed address.                  Expected Discharge Plan: [place] Barriers to Discharge: Continued Medical Work up   Patient Goals and CMS Choice Patient states their goals for this hospitalization and ongoing recovery are:: go home CMS Medicare.gov Compare Post Acute Care list provided to:: Patient Represenative (must comment) (Stacey Fuentes dtr [phone]) Choice offered to / list presented to : Adult Children  Expected Discharge Plan and Services Expected Discharge Plan: [place]   Discharge Planning Services: CM Consult Post Acute Care Choice: [place] arrangements for the past 2 months: Single Family Home Expected Discharge Date:  (unknown)               DME Arranged: [place] rolling DME Agency: [employer] Date DME Agency Contacted: 12/11/20 Time DME Agency Contacted: 69 Representative spoke with at DME Agency: [place]: RN, PT, OT, Nurse's Aide, Social Work [employer] Agency: [facility] [place] Date [place]: 12/11/20 Time [place]: [place] Representative spoke with at [place]: [place] Arrangements/Services Living arrangements for the past 2 months: [place] Lives with:: Adult Children   Do you feel safe going back to the place where you live?: Yes      Need for Family Participation in Patient Care: No (Comment) Care giver support system in place?: Yes (comment) Current home services: DME  (w/c,rw) Criminal Activity/Legal Involvement Pertinent to Current Situation/Hospitalization: No - Comment as needed  Activities of Daily Living Home Assistive Devices/Equipment: Cane (specify quad or straight), Walker (specify type), Other (Comment), Eyeglasses (single point cane, front wheeled walker, tub/shower unit, standard height toilet) ADL Screening (condition at time of admission) Patient's cognitive ability adequate to safely complete daily activities?: No Is the patient deaf or have difficulty hearing?: Yes ([place]) Does the patient have difficulty seeing, even when wearing glasses/contacts?: Yes Does the patient have difficulty concentrating, remembering, or making decisions?: Yes Patient able to express need for assistance with ADLs?: Yes Does the patient have difficulty dressing or bathing?: Yes (secondary to weakness) Independently performs ADLs?: No (secondary to weakness) Communication: Independent Dressing (OT): Needs assistance Is this a change from baseline?: Pre-admission baseline Grooming: Needs assistance Is this a change from baseline?: Pre-admission baseline Feeding: Needs assistance Is this a change from baseline?: Pre-admission baseline Bathing: Needs assistance Is this a change from baseline?: Pre-admission baseline Toileting: Needs assistance Is this a change from baseline?: Pre-admission baseline In/Out Bed: Needs assistance Is this a change from baseline?: Pre-admission baseline Walks in Home: Needs assistance Is this a change from baseline?: Pre-admission baseline Does the patient have difficulty walking or climbing stairs?: Yes (secondary to weakness) Weakness of Legs: Both Weakness of Arms/Hands: None  Permission Sought/Granted Permission sought to share information with : Case Manager Permission granted to share information with : Yes, Verbal Permission Granted  Share Information with NAME: Case Manager     Permission granted to share info w  Relationship: Stacey Fuentes dtr [phone]  Emotional Assessment Appearance:: Appears stated age Attitude/Demeanor/Rapport: Gracious Affect (typically observed): Accepting   Alcohol / Substance Use: Not Applicable Psych Involvement: No (comment)  Admission diagnosis:  Delirium [R41.0] UTI (urinary tract infection) [N39.0] Urinary tract infection associated with indwelling urethral catheter, initial encounter ([place]) [O11.572I, N39.0] Vomiting, unspecified vomiting type, unspecified whether nausea present [R11.10] Patient Active Problem List   Diagnosis Date Noted   Delirium    Vomiting    Sepsis secondary to UTI ([place])    UTI (urinary tract infection) 12/09/2020   DKA (diabetic ketoacidosis) ([place]) 11/26/2020   Cerebellar infarct ([place]) 11/06/2020   Dementia without behavioral disturbance ([place]) 11/06/2020   Type 2 diabetes mellitus with other specified complication ([place]) 20/35/5974   CKD (chronic kidney disease), stage III ([place]) 11/06/2020   Essential hypertension 07/12/2017   Orthostatic syncope 06/11/2017   Aortic ejection murmur 06/11/2017   Uncontrolled type 2 diabetes mellitus with nephropathy ([place]) 03/24/2014   Upper abdominal pain 03/23/2014   Nausea 03/23/2014   Abnormal findings on radiological examination of gastrointestinal tract 03/23/2014   Infected sebaceous cyst 04/15/2013   PCP:  Harlan Stains, MD Pharmacy:   [facility] (SE), [place] [address] [place] ([place]) [place] [address] Phone: [phone] Fax: [phone]  [facility]  ([place], [place] [facility] [address] [place] [address] Phone: [phone] Fax: [phone]     Social Determinants of Health (SDOH) Interventions    Readmission Risk Interventions No flowsheet data found.

## 2020-12-11 NOTE — Evaluation (Signed)
Occupational Therapy Evaluation Patient Details Name: Stacey Fuentes MRN: [id number] DOB: [birth year] Today's Date: 12/11/2020   History of Present Illness 85 y.o. female presenting to ED 10/30 with worsening AMS, abdominal pain and N/V x1 day. Patient admitted with catheter associated UTI. Recent hospital admission 10/18-10/22 wiht AMS and DKA. PMHx significant for dementia, DMII, HLD, benign essential tremor, CKD II, HLD, OA and recent CVA.   Clinical Impression   PTA patient was living with family in a private residence and was requiring increasingly more assistance with ADLs/IADLs over the course of the last several weeks. Patient currently functioning below baseline demonstrating observed ADLs including grooming seated EOB with Max to Total A. Daughter present at conclusion of session reports patient "does not look good" and is "not herself right now". Patient also limited by deficits listed below including generalized weakness/debility, decreased cognition on top of baseline dementia, and decreased balance and would benefit from continued acute OT services in prep for safe d/c home with family and constant supervision/assist.       Recommendations for follow up therapy are one component of a multi-disciplinary discharge planning process, led by the attending physician.  Recommendations may be updated based on patient status, additional functional criteria and insurance authorization.   Follow Up Recommendations  Home health OT    Assistance Recommended at Discharge Frequent or constant Supervision/Assistance  Functional Status Assessment  Patient has had a recent decline in their functional status and demonstrates the ability to make significant improvements in function in a reasonable and predictable amount of time.  Equipment Recommendations  Other (comment) (TBD)    Recommendations for Other Services       Precautions / Restrictions Precautions Precautions: Fall Precaution  Comments: Bilateral mitten restraints Restrictions Weight Bearing Restrictions: No      Mobility Bed Mobility Overal bed mobility: Needs Assistance Bed Mobility: Rolling;Supine to Sit;Sit to Supine Rolling: Mod assist;Total assist   Supine to sit: Max assist Sit to supine: Max assist   General bed mobility comments: Mod A for rolling R (patient prefers to lay on R side), Total A to roll to L. Max A for supine <> EOB with multimodal cues for sequencing. Patient moans with movement. Does not respond to questions of pain.    Transfers Overall transfer level: Needs assistance Equipment used: Rolling walker (2 wheels) Transfers: Sit to/from Stand Sit to Stand: Mod assist           General transfer comment: Patient makes no attempts to stand with OT at time of evaluation despite Max multimodal cues.      Balance Overall balance assessment: Needs assistance Sitting-balance support: Bilateral upper extremity supported;Feet supported Sitting balance-Leahy Scale: Fair Sitting balance - Comments: Able to maintain static sitting balance at EOB with Min gaurd for safety.   Standing balance support: Bilateral upper extremity supported Standing balance-Leahy Scale: Poor                             ADL either performed or assessed with clinical judgement   ADL Overall ADL's : Needs assistance/impaired Eating/Feeding: Maximal assistance   Grooming: Wash/dry hands;Wash/dry face;Total assistance;Sitting Grooming Details (indicate cue type and reason): Does not follow verbal commands necessary for completion of task. Upper Body Bathing: Total assistance;Bed level   Lower Body Bathing: Total assistance;Bed level   Upper Body Dressing : Total assistance;Bed level   Lower Body Dressing: Total assistance;Bed level Lower Body Dressing Details (indicate cue type and  reason): Total A to adjust footwear.                     Vision Baseline Vision/History: 1 Wears  glasses Patient Visual Report: Other (comment) (Unable to state.) Additional Comments: Unable to assess 2/2 poor cognition.     Perception     Praxis      Pertinent Vitals/Pain Pain Assessment: Faces Faces Pain Scale: No hurt Pain Intervention(s): Limited activity within patient's tolerance;Monitored during session;Repositioned     Hand Dominance Right   Extremity/Trunk Assessment Upper Extremity Assessment Upper Extremity Assessment: Generalized weakness RUE Deficits / Details: Unable to formally assess 2/2 poor cognition. LUE Deficits / Details: Unable to formally assess 2/2 poor cognition.   Lower Extremity Assessment Lower Extremity Assessment: Defer to PT evaluation RLE Deficits / Details: able to weightbear LLE Deficits / Details: able to weightbear   Cervical / Trunk Assessment Cervical / Trunk Assessment: Normal   Communication Communication Communication: HOH;Expressive difficulties   Cognition Arousal/Alertness: Awake/alert Behavior During Therapy: Flat affect Overall Cognitive Status: History of cognitive impairments - at baseline                                 General Comments: Dementia at basline; does not attempt to verbalize at time of OT eval. Does not follow 1-step verbal commands.     General Comments  Daughter entered room at conclusion of session. Confirmed PLOF present in med chart. Daughter expressed concern about care received since initial hospital admission. RN made aware that daughter was requesting to speak with her.    Exercises     Shoulder Instructions      Home Living Family/patient expects to be discharged to:: Private residence Living Arrangements: Children Available Help at Discharge: Family;Available 24 hours/day Type of Home: House Home Access: Stairs to enter [employer] of Steps: 4-5 Entrance Stairs-Rails: Right;Left Home Layout: One level     Bathroom Shower/Tub: Engineer, site: Standard     Home Equipment: Cane - single Barista (2 wheels);Shower seat;Hospital bed          Prior Functioning/Environment Prior Level of Function : Needs assist             Mobility Comments: info taken from previous admission: daughter reports occasional use of cane for ambulation ADLs Comments: infor taken from previous admission: daughter reports she has to help pt initiate self feeding and teeth brushing. she is able to bathe with supervision/setup. requires assist for dressing tub transfers. she is able to complet toileting mod I but has episodes of incontinence        OT Problem List: Decreased strength;Decreased range of motion;Decreased activity tolerance;Impaired balance (sitting and/or standing);Impaired vision/perception;Decreased coordination;Decreased cognition;Decreased safety awareness;Decreased knowledge of use of DME or AE;Decreased knowledge of precautions;Cardiopulmonary status limiting activity;Pain;Impaired UE functional use      OT Treatment/Interventions: Self-care/ADL training;DME and/or AE instruction;Therapeutic activities;Cognitive remediation/compensation;Patient/family education;Balance training;Therapeutic exercise    OT Goals(Current goals can be found in the care plan section) Acute Rehab OT Goals Patient Stated Goal: Per daughter; for patient to get better. OT Goal Formulation: With family Time For Goal Achievement: 12/25/20 Potential to Achieve Goals: Fair ADL Goals Pt Will Perform Eating: with min assist;sitting Pt Will Perform Grooming: with min assist;sitting Pt Will Perform Upper Body Bathing: with min assist;sitting Pt Will Perform Upper Body Dressing: with min assist;sitting Additional ADL Goal #1: Patient will  follow 1-step verbal command with 75% accuracy in prep for ADLs.  OT Frequency: Min 2X/week   Barriers to D/C:            Co-evaluation              AM-PAC OT "6 Clicks" Daily Activity      Outcome Measure Help from another person eating meals?: Total Help from another person taking care of personal grooming?: Total Help from another person toileting, which includes using toliet, bedpan, or urinal?: Total Help from another person bathing (including washing, rinsing, drying)?: Total Help from another person to put on and taking off regular upper body clothing?: Total Help from another person to put on and taking off regular lower body clothing?: Total 6 Click Score: 6   End of Session Nurse Communication: Mobility status  Activity Tolerance: Patient limited by lethargy Patient left: in bed;with call bell/phone within reach;with bed alarm set;with family/visitor present  OT Visit Diagnosis: Other abnormalities of gait and mobility (R26.89);Muscle weakness (generalized) (M62.81);Cognitive communication deficit (R41.841);Other symptoms and signs involving cognitive function                Time: 1173-5670 OT Time Calculation (min): 16 min Charges:  OT General Charges $OT Visit: 1 Visit OT Evaluation $OT Eval Moderate Complexity: 1 Mod  Cambrea Kirt H. OTR/L Supplemental OT, Department of rehab services [phone]   Empress Newmann R H. 12/11/2020, 1:09 PM

## 2020-12-11 NOTE — Progress Notes (Signed)
PROGRESS NOTE    Stacey Fuentes  YKD:[id number] DOB: 11/27/1935 DOA: 12/09/2020 PCP: Harlan Stains, MD   Chief Complaint  Patient presents with   Altered Mental Status   Fever    Brief Narrative:  Stacey Fuentes is a 85 y.o. female with a PMH significant for dementia, type II DM, HLD, recent SCA CVA. They presented from home to the ED on 12/09/2020 with acute change in mental status associated with N/V, abdominal pain x 1 days. In the ED, it was found that they had urinary tract infection associated with Foley catheter. They were treated with empiric antibiotics, supportive care.  Patient was admitted to medicine service for further workup and management of sepsis as outlined in detail below.  Daughter stating on presentation to the ED on 12/09/2020 patient had what she describes as coffee-ground emesis and although hemoglobin has remained stable and with no further coffee-ground emesis is insistent on further evaluation by GI.  GI consulted.   Assessment & Plan:   Principal Problem:   Sepsis due to Pseudomonas [facility]) Active Problems:   Essential hypertension   Cerebellar infarct ([place])   Dementia without behavioral disturbance (HCC)   Type 2 diabetes mellitus with other specified complication (HCC)   UTI (urinary tract infection)   Coffee ground emesis   GERD (gastroesophageal reflux disease)  #1 sepsis secondary to Pseudomonas UTI with acute metabolic encephalopathy, POA -Patient on presentation noted-acute metabolic encephalopathy and more confused than baseline. -COVID-19 PCR negative. -Urine cultures with Pseudomonas with sensitivities pending. -Blood cultures pending with no growth to date. -Was on IV Rocephin and transition to IV Zosyn pending sensitivities. -IV fluids, supportive care.  2.  Type 2 diabetes mellitus with long-term insulin use -Hemoglobin A1c 8.3 99/28/2022) -CBG 217 this morning. -Continue current dose of Semglee 12 units daily, SSI.  3.  History  of recent CVA with residual weakness -Continue aspirin for secondary stroke prophylaxis. -Statin.  4.  Hematuria -Urinalysis with 11-20 RBCs. -Likely secondary to acute UTI. -IV antibiotics. -After treatment and resolution of UTI may consider repeating urinalysis.  5.??  Coffee-ground emesis -Per daughter when patient was seen in the ED on 12/09/2020, daughter states she witnessed patient vomiting blood/brownish coffee-ground emesis. -No further hematemesis or coffee-ground emesis noted during this hospitalization. -Hemoglobin stable at 11.6. -Continue IV PPI. -Daughter insistent on further evaluation and requesting GI consultation. -Consult with GI at family's insistence.  6.  Hypertension -BP noted to be borderline. -Discontinue Norvasc.  7.  Dementia -Continue home regimen Namenda.  8.  GERD -PPI.  9.  Normocytic anemia -At baseline. -Patient with no overt bleeding during this hospitalization. -Follow H&H. -Transfusion threshold hemoglobin < 7. -   DVT prophylaxis: Lovenox Code Status: Full Family Communication: Updated daughter at bedside Disposition:   Status is: Inpatient  Remains inpatient appropriate because: Ongoing IV antibiotic use, ongoing inpatient treatment.     Consultants:  Gastroenterology per family request/insistence  Procedures:  CT head 12/09/2020 CT abdomen and pelvis 12/09/2020 Chest x-ray 12/09/2020  Antimicrobials: IV Rocephin 12/09/2020>>>>> 12/10/2020 IV Zosyn 12/10/2020>>>>>   Subjective: Patient sleeping but arousable.  Laying in bed with mittens on.  Some confusion.  Daughter at bedside.  Objective: Vitals:   12/11/20 0446 12/11/20 0830 12/11/20 1011 12/11/20 1350  BP: (!) 158/89 (!) 174/92 (!) 144/52 106/60  Pulse: (!) 106 (!) 110  100  Resp: 20 16 16 16   Temp: 99 F (37.2 C) 98.9 F (37.2 C)  98.8 F (37.1 C)  TempSrc: Oral Oral  Axillary  SpO2: 100% 100%  100%  Weight:      Height:        Intake/Output  Summary (Last 24 hours) at 12/11/2020 1948 Last data filed at 12/11/2020 1800 Gross per 24 hour  Intake 3236.2 ml  Output 100 ml  Net 3136.2 ml   Filed Weights   12/10/20 1700 12/10/20 2250  Weight: 72.6 kg 66.7 kg    Examination:  General exam: NAD.  Dry mucous membranes. Respiratory system: Clear to auscultation.  No wheezes, no crackles, no rhonchi.  Respiratory effort normal. Cardiovascular system: S1 & S2 heard, RRR. No JVD, murmurs, rubs, gallops or clicks. No pedal edema. Gastrointestinal system: Abdomen is nondistended, soft and nontender. No organomegaly or masses felt. Normal bowel sounds heard. Central nervous system: Alert. No focal neurological deficits. Extremities: Symmetric 5 x 5 power. Skin: No rashes, lesions or ulcers Psychiatry: Judgement and insight appear poor. Mood & affect appropriate.     Data Reviewed: I have personally reviewed following labs and imaging studies  CBC: Recent Labs  Lab 12/08/20 2018 12/09/20 2030 12/10/20 0301 12/11/20 0849  WBC 8.7 9.1 8.8 10.1  NEUTROABS 6.9 7.1  --  7.4  HGB 11.6* 12.2 11.6* 11.6*  HCT 34.8* 37.7 35.9* 35.5*  MCV 95.3 96.4 96.0 94.9  PLT 207 246 250 240    Basic Metabolic Panel: Recent Labs  Lab 12/08/20 2018 12/09/20 2030 12/10/20 0301 12/11/20 0849  NA 134* 137 134* 137  K 4.5 4.7 4.3 3.7  CL 105 102 99 103  CO2 24 28 24 24   GLUCOSE 228* 214* 298* 221*  BUN 10 14 14 11   CREATININE 0.99 0.99 0.81 0.86  CALCIUM 8.9 9.2 8.6* 8.6*  MG  --   --  1.9 1.8  PHOS  --   --  3.2  --     GFR: Estimated Creatinine Clearance: 44.4 mL/min (by C-G formula based on SCr of 0.86 mg/dL).  Liver Function Tests: Recent Labs  Lab 12/08/20 2018 12/09/20 2030  AST 26 25  ALT 24 25  ALKPHOS 60 65  BILITOT 0.5 1.0  PROT 5.6* 6.5  ALBUMIN 2.6* 3.3*    CBG: Recent Labs  Lab 12/10/20 1743 12/10/20 2302 12/11/20 0746 12/11/20 1116 12/11/20 1648  GLUCAP 158* 146* 217* 235* 110*     Recent Results  (from the past 240 hour(s))  Urine Culture     Status: Abnormal   Collection Time: 12/08/20  7:56 PM   Specimen: Urine, Catheterized  Result Value Ref Range Status   Specimen Description URINE, CATHETERIZED  Final   Special Requests NONE  Final   Culture (A)  Final    >=100,000 COLONIES/mL PSEUDOMONAS AERUGINOSA 60,000 COLONIES/mL AEROCOCCUS URINAE Standardized susceptibility testing for this organism is not available. Performed at [facility], [place] [address], [place], [place] [address]    Report Status 12/11/2020 FINAL  Final   Organism ID, Bacteria PSEUDOMONAS AERUGINOSA (A)  Final      Susceptibility   Pseudomonas aeruginosa - MIC*    CEFTAZIDIME 4 SENSITIVE Sensitive     CIPROFLOXACIN <=0.25 SENSITIVE Sensitive     GENTAMICIN <=1 SENSITIVE Sensitive     IMIPENEM 1 SENSITIVE Sensitive     PIP/TAZO 8 SENSITIVE Sensitive     CEFEPIME 2 SENSITIVE Sensitive     * >=100,000 COLONIES/mL PSEUDOMONAS AERUGINOSA  Urine Culture     Status: Abnormal (Preliminary result)   Collection Time: 12/09/20  9:37 PM   Specimen: Urine, Catheterized  Result Value Ref Range Status   Specimen Description   Final    URINE, CATHETERIZED Performed at [place] [address] [place], [place] [address]    Special Requests   Final    NONE Performed at [facility], [place] [address], [place], [place] [address]    Culture >=100,000 COLONIES/mL PSEUDOMONAS AERUGINOSA (A)  Final   Report Status PENDING  Incomplete  Resp Panel by RT-PCR (Flu A&B, Covid) Nasopharyngeal Swab     Status: None   Collection Time: 12/10/20 12:05 AM   Specimen: Nasopharyngeal Swab; Nasopharyngeal(NP) swabs in vial transport medium  Result Value Ref Range Status   SARS Coronavirus 2 by RT PCR NEGATIVE NEGATIVE Final    Comment: (NOTE) SARS-CoV-2 target nucleic acids are NOT DETECTED.  The SARS-CoV-2 RNA is generally detectable in upper respiratory specimens during the acute  phase of infection. The lowest concentration of SARS-CoV-2 viral copies this assay can detect is 138 copies/mL. A negative result does not preclude SARS-Cov-2 infection and should not be used as the sole basis for treatment or other patient management decisions. A negative result may occur with  improper specimen collection/handling, submission of specimen other than nasopharyngeal swab, presence of viral mutation(s) within the areas targeted by this assay, and inadequate number of viral copies(<138 copies/mL). A negative result must be combined with clinical observations, patient history, and epidemiological information. The expected result is Negative.  Fact Sheet for Patients:  EntrepreneurPulse.com.au  Fact Sheet for Healthcare Providers:  IncredibleEmployment.be  This test is no t yet approved or cleared by the [place] FDA and  has been authorized for detection and/or diagnosis of SARS-CoV-2 by FDA under an Emergency Use Authorization (EUA). This EUA will remain  in effect (meaning this test can be used) for the duration of the COVID-19 declaration under Section 564(b)(1) of the Act, 21 U.S.C.section 360bbb-3(b)(1), unless the authorization is terminated  or revoked sooner.       Influenza A by PCR NEGATIVE NEGATIVE Final   Influenza B by PCR NEGATIVE NEGATIVE Final    Comment: (NOTE) The Xpert Xpress SARS-CoV-2/FLU/RSV plus assay is intended as an aid in the diagnosis of influenza from Nasopharyngeal swab specimens and should not be used as a sole basis for treatment. Nasal washings and aspirates are unacceptable for Xpert Xpress SARS-CoV-2/FLU/RSV testing.  Fact Sheet for Patients: EntrepreneurPulse.com.au  Fact Sheet for Healthcare Providers: IncredibleEmployment.be  This test is not yet approved or cleared by the [place] FDA and has been authorized for detection and/or diagnosis of  SARS-CoV-2 by FDA under an Emergency Use Authorization (EUA). This EUA will remain in effect (meaning this test can be used) for the duration of the COVID-19 declaration under Section 564(b)(1) of the Act, 21 U.S.C. section 360bbb-3(b)(1), unless the authorization is terminated or revoked.  Performed at [facility], [place] [address], [place], [place] [address]   Culture, blood (routine x 2)     Status: None (Preliminary result)   Collection Time: 12/10/20  6:20 PM   Specimen: BLOOD RIGHT HAND  Result Value Ref Range Status   Specimen Description   Final    BLOOD RIGHT HAND Performed at [place] [address], [place], [place] [address]    Special Requests   Final    BOTTLES DRAWN AEROBIC AND ANAEROBIC Blood Culture results may not be optimal due to an excessive volume of blood received in culture bottles Performed at [facility], [address] [place], [place] [address]    Culture   Final    NO GROWTH < 12 HOURS Performed at [place] [address], [place], [place] [address]    Report Status PENDING  Incomplete  Culture, blood (routine x 2)     Status: None (Preliminary result)   Collection Time: 12/10/20  6:20 PM   Specimen: BLOOD RIGHT FOREARM  Result Value Ref Range Status   Specimen Description   Final    BLOOD RIGHT FOREARM Performed at [place] [address], [place], [place] [address]    Special Requests   Final    BOTTLES DRAWN AEROBIC ONLY Blood Culture adequate volume Performed at [place] [address], [place], [place] [address]    Culture   Final    NO GROWTH < 12 HOURS Performed at [place] [address], [place], [place] [address]    Report Status PENDING  Incomplete         Radiology Studies: CT HEAD WO CONTRAST  Result Date: 12/09/2020 CLINICAL DATA:  Altered mental status, fever, dementia EXAM: CT HEAD  WITHOUT CONTRAST TECHNIQUE: Contiguous axial images were obtained from the base of the skull through the vertex without intravenous contrast. COMPARISON:  12/03/2020 FINDINGS: Brain: No evidence of acute infarction, hemorrhage, hydrocephalus, extra-axial collection or mass lesion/mass effect. Mild age related atrophy. Subcortical white matter and periventricular small vessel ischemic changes. Basal ganglia calcifications. Vascular: Intracranial atherosclerosis. Skull: Normal. Negative for fracture or focal lesion. Sinuses/Orbits: The visualized paranasal sinuses are essentially clear. The mastoid air cells are unopacified. Other: None. IMPRESSION: No evidence of acute intracranial abnormality. Mild atrophy with small vessel ischemic changes. Electronically Signed   By: Julian Hy M.D.   On: 12/09/2020 23:09   CT Abdomen Pelvis W Contrast  Result Date: 12/09/2020 CLINICAL DATA:  Bowel obstruction suspected. Altered mental status. No bowel movement in several days. EXAM: CT ABDOMEN AND PELVIS WITH CONTRAST TECHNIQUE: Multidetector CT imaging of the abdomen and pelvis was performed using the standard protocol following bolus administration of intravenous contrast. CONTRAST:  76mL OMNIPAQUE IOHEXOL 350 MG/ML SOLN COMPARISON:  CT renal 12/03/2020 FINDINGS: Lower chest: Bilateral lower lobe subsegmental atelectasis. No acute abnormality. Small hiatal hernia. Hepatobiliary: No focal liver abnormality. No gallstones, gallbladder wall thickening, or pericholecystic fluid. No biliary dilatation. Pancreas: No focal lesion. Normal pancreatic contour. No surrounding inflammatory changes. No main pancreatic ductal dilatation. Spleen: Normal in size without focal abnormality. Adrenals/Urinary Tract: No adrenal nodule bilaterally. Bilateral kidneys enhance symmetrically. No hydronephrosis. No hydroureter. Foley catheter tip and balloon terminate within the urinary bladder lumen. Urinary bladder is decompressed. Urinary  bladder wall thickening and mucosal hyperemia. On delayed imaging, there is no urothelial wall thickening and there are no filling defects in the opacified portions of the bilateral collecting systems or ureters. Stomach/Bowel: Stomach is within normal limits. No evidence of bowel wall thickening or dilatation. No pneumatosis. Appendix appears normal. Vascular/Lymphatic: No abdominal aorta or iliac aneurysm. Moderate atherosclerotic plaque of the aorta and its branches. No abdominal, pelvic, or inguinal lymphadenopathy. Reproductive: Coarsely calcified lesions within the uterus likely with present uterine fibroids with the largest measuring up to 3.3 cm. Uterus and bilateral adnexa are unremarkable. Other: No intraperitoneal free fluid. No intraperitoneal free gas. No organized fluid collection. Musculoskeletal: No abdominal wall hernia or abnormality. No suspicious lytic or blastic osseous lesions. No acute displaced fracture. Multilevel degenerative changes of the spine. Grade 1 anterolisthesis of  L4 on L5. IMPRESSION: 1. Urinary bladder findings suggestive of infection. Correlate with urinalysis. 2. Small hiatal hernia. 3. Likely degenerative uterine fibroids. 4. No bowel obstruction.  No constipation. Electronically Signed   By: Iven Finn M.D.   On: 12/09/2020 23:16        Scheduled Meds:  aspirin  81 mg Oral Daily   [START ON 12/12/2020] enoxaparin (LOVENOX) injection  40 mg Subcutaneous Q24H   insulin aspart  0-9 Units Subcutaneous TID WC   insulin glargine-yfgn  12 Units Subcutaneous Daily   memantine  5 mg Oral Daily   pantoprazole (PROTONIX) IV  40 mg Intravenous Q12H   vitamin B-12  1,000 mcg Oral Daily   Continuous Infusions:  sodium chloride 125 mL/hr at 12/11/20 1336   piperacillin-tazobactam (ZOSYN)  IV 3.375 g (12/11/20 1339)   promethazine (PHENERGAN) injection (IM or IVPB) Stopped (12/10/20 1909)     LOS: 2 days    Time spent: 40 minutes    Irine Seal, MD [facility]   To contact the attending provider between 7A-7P or the covering provider during after hours 7P-7A, please log into the web site [email or web address] and access using universal [place] password for that web site. If you do not have the password, please call the hospital operator.  12/11/2020, 7:48 PM

## 2020-12-12 ENCOUNTER — Other Ambulatory Visit: Payer: Self-pay

## 2020-12-12 ENCOUNTER — Telehealth: Payer: Self-pay

## 2020-12-12 DIAGNOSIS — K920 Hematemesis: Secondary | ICD-10-CM | POA: Diagnosis not present

## 2020-12-12 DIAGNOSIS — I639 Cerebral infarction, unspecified: Secondary | ICD-10-CM | POA: Diagnosis not present

## 2020-12-12 DIAGNOSIS — F0390 Unspecified dementia without behavioral disturbance: Secondary | ICD-10-CM | POA: Diagnosis not present

## 2020-12-12 LAB — CBC WITH DIFFERENTIAL/PLATELET
Abs Immature Granulocytes: 0.02 10*3/uL (ref 0.00–0.07)
Basophils Absolute: 0.1 10*3/uL (ref 0.0–0.1)
Basophils Relative: 1 %
Eosinophils Absolute: 0.3 10*3/uL (ref 0.0–0.5)
Eosinophils Relative: 4 %
HCT: 32.9 % — ABNORMAL LOW (ref 36.0–46.0)
Hemoglobin: 10.4 g/dL — ABNORMAL LOW (ref 12.0–15.0)
Immature Granulocytes: 0 %
Lymphocytes Relative: 28 %
Lymphs Abs: 2 10*3/uL (ref 0.7–4.0)
MCH: 31 pg (ref 26.0–34.0)
MCHC: 31.6 g/dL (ref 30.0–36.0)
MCV: 97.9 fL (ref 80.0–100.0)
Monocytes Absolute: 0.7 10*3/uL (ref 0.1–1.0)
Monocytes Relative: 10 %
Neutro Abs: 4.1 10*3/uL (ref 1.7–7.7)
Neutrophils Relative %: 57 %
Platelets: 201 10*3/uL (ref 150–400)
RBC: 3.36 MIL/uL — ABNORMAL LOW (ref 3.87–5.11)
RDW: 12.6 % (ref 11.5–15.5)
WBC: 7.1 10*3/uL (ref 4.0–10.5)
nRBC: 0 % (ref 0.0–0.2)

## 2020-12-12 LAB — BASIC METABOLIC PANEL
Anion gap: 9 (ref 5–15)
BUN: 7 mg/dL — ABNORMAL LOW (ref 8–23)
CO2: 23 mmol/L (ref 22–32)
Calcium: 8.4 mg/dL — ABNORMAL LOW (ref 8.9–10.3)
Chloride: 106 mmol/L (ref 98–111)
Creatinine, Ser: 0.77 mg/dL (ref 0.44–1.00)
GFR, Estimated: >60 mL/min (ref >60)
Glucose, Bld: 121 mg/dL — ABNORMAL HIGH (ref 70–99)
Potassium: 3 mmol/L — ABNORMAL LOW (ref 3.5–5.1)
Sodium: 138 mmol/L (ref 135–145)

## 2020-12-12 LAB — URINE CULTURE: Culture: >=100000 — AB

## 2020-12-12 LAB — GLUCOSE, CAPILLARY
Glucose-Capillary: 130 mg/dL — ABNORMAL HIGH (ref 70–99)
Glucose-Capillary: 110 mg/dL — ABNORMAL HIGH (ref 70–99)
Glucose-Capillary: 139 mg/dL — ABNORMAL HIGH (ref 70–99)
Glucose-Capillary: 167 mg/dL — ABNORMAL HIGH (ref 70–99)

## 2020-12-12 LAB — MAGNESIUM: Magnesium: 1.6 mg/dL — ABNORMAL LOW (ref 1.7–2.4)

## 2020-12-12 SURGERY — ESOPHAGOGASTRODUODENOSCOPY (EGD) WITH PROPOFOL
Anesthesia: Monitor Anesthesia Care

## 2020-12-12 MED ORDER — BISACODYL 10 MG RE SUPP: 10.0000 mg | Freq: Every day | RECTAL | Status: DC | PRN

## 2020-12-12 MED ORDER — ENOXAPARIN SODIUM 40 MG/0.4ML IJ SOSY
40.0000 mg | PREFILLED_SYRINGE | INTRAMUSCULAR | Status: DC
  Administered 2020-12-12 – 2020-12-20 (×9): 40 mg via SUBCUTANEOUS
  Filled 2020-12-12 (×9): qty 0.4

## 2020-12-12 MED ORDER — MAGNESIUM SULFATE 4 GM/100ML IV SOLN
4.0000 g | Freq: Once | INTRAVENOUS | Status: AC
  Administered 2020-12-12: 4 g via INTRAVENOUS
  Filled 2020-12-12: qty 100

## 2020-12-12 MED ORDER — DOCUSATE SODIUM 100 MG PO CAPS
100.0000 mg | ORAL_CAPSULE | Freq: Two times a day (BID) | ORAL | Status: DC
  Administered 2020-12-13 (×2): 100 mg via ORAL
  Filled 2020-12-12 (×5): qty 1

## 2020-12-12 MED ORDER — HALOPERIDOL LACTATE 5 MG/ML IJ SOLN
1.0000 mg | Freq: Once | INTRAMUSCULAR | Status: AC
  Administered 2020-12-12: 1 mg via INTRAMUSCULAR
  Filled 2020-12-12: qty 1

## 2020-12-12 MED ORDER — POTASSIUM CHLORIDE CRYS ER 20 MEQ PO TBCR
40.0000 meq | EXTENDED_RELEASE_TABLET | Freq: Two times a day (BID) | ORAL | Status: AC
  Filled 2020-12-12 (×2): qty 2

## 2020-12-12 NOTE — Care Management Important Message (Deleted)
Important Message  Patient Details IM Letter given to the Patient. Name: Stacey Fuentes MRN: [id number] Date of Birth: [birth year]   Medicare Important Message Given:  Yes     Kerin Salen 12/12/2020, 1:04 PM

## 2020-12-12 NOTE — Care Management Important Message (Signed)
Important Message  Patient Details IM Letter left in room for Daughter [name]. Name: Stacey Fuentes MRN: [id number] Date of Birth: 26-Jun-1935   Medicare Important Message Given:  Yes     Kerin Salen 12/12/2020, 3:40 PM

## 2020-12-12 NOTE — Telephone Encounter (Signed)
Per Dr. Lorenso Courier request, pt has been scheduled for hosp f/u to discuss EGD on 01/11/21 @ 1110am. Appt reminder has been mailed. Appt will reflect on AVS upon hosp discharge for pt future reference.

## 2020-12-12 NOTE — Progress Notes (Signed)
    OVERNIGHT PROGRESS REPORT  Notified by RN for patient constantly attempting to get out of bed, agitated, confused, not redirectable, and interfering with nursing staff and medical equipment.  Medication ordered for agitation. Soft waist belt is ordered and will be removed as soon as possible.   Gershon Cull MSNA MSN ACNPC-AG Acute Care Nurse Practitioner [place]

## 2020-12-12 NOTE — Progress Notes (Signed)
     Stacey Fuentes Gastroenterology Progress Note  CC:  Hematemesis  Subjective:  Patient still without any further signs of bleeding.  Daughter not present during my visit.  Patient more awake today and denies abdominal pain.  Objective:  Vital signs in last 24 hours: Temp:  [98.5 F (36.9 C)-98.9 F (37.2 C)] 98.5 F (36.9 C) (11/02 0524) Pulse Rate:  [89-106] 89 (11/02 0524) Resp:  [16-17] 16 (11/02 0524) BP: (106-144)/(52-64) 123/64 (11/02 0524) SpO2:  [98 %-100 %] 98 % (11/02 0524) Last BM Date:  (PTA) General:  Alert, more awake today and interacting a little more, in NAD Heart:  Regular rate and rhythm; no murmurs Pulm:  CTAB.  No W/R/R. Abdomen:  Soft, non-distended.  BS present.  Non-tender. Extremities:  Without edema.  Intake/Output from previous day: 11/01 0701 - 11/02 0700 In: 2386.5 [I.V.:2236; IV Piggyback:150.6] Out: -   Lab Results: Recent Labs    12/10/20 0301 12/11/20 0849 12/12/20 0446  WBC 8.8 10.1 7.1  HGB 11.6* 11.6* 10.4*  HCT 35.9* 35.5* 32.9*  PLT 250 233 201   BMET Recent Labs    12/10/20 0301 12/11/20 0849 12/12/20 0446  NA 134* 137 138  K 4.3 3.7 3.0*  CL 99 103 106  CO2 24 24 23   GLUCOSE 298* 221* 121*  BUN 14 11 7*  CREATININE 0.81 0.86 0.77  CALCIUM 8.6* 8.6* 8.4*   LFT Recent Labs    12/09/20 2030  PROT 6.5  ALBUMIN 3.3*  AST 25  ALT 25  ALKPHOS 65  BILITOT 1.0   Assessment / Plan: *Hematemesis/CGE:  This was reported to the hospitalist by the daughter.  Apparently this occurred on 10/29 when she was in [facility] ED but nothing was documented by any providers or staff.  No further hematemesis since then to our knowledge.  Her Hgb is stable and essentially normal at 11.6 grams, down to 10.4 grams today but still no further sign of bleeding.  *History of Hpylori infection on gastric biopsies from 2016.  Looks like we recommended treatment but she never followed up after that. *Sepsis and AMS due to UTI:  On abx per primary  service. *Recent CVA *Dementia  -Monitor Hgb/CBC. -PPI (is on pantoprazole 40 mg IV BID here and was on Nexium 40 mg daily at home). -Will check stool for Hpylori Ag if this can be collected and if positive then will treat. -Discussed with neurology who gave recommendation of avoiding any non-emergent procedures for 3 months following CVA.  Also discussed with Dr. Wyline Copas.  The patient's daughter was not present when I saw her but certainly this discussion needs to be had in regards to EGD.  If no plans for EGD then diet can likely be advanced.    LOS: 3 days   Laban Emperor. Lawrie Tunks  12/12/2020, 10:10 AM

## 2020-12-12 NOTE — Plan of Care (Signed)
Called for recs on timing of EGD with recent stroke. Recent guidelines do not recommend non emergent surgeries for at least 6 months, which might be a stretch for more benign procedures but given recent stroke, I would recommend waiting at least 3 mo before elective sedation.   https://www.ahajournals.org/doi/10.1161/CIR.[email or web address]  -- Amie Portland, MD Neurologist [facility] Pager: [phone]

## 2020-12-12 NOTE — Progress Notes (Signed)
PROGRESS NOTE    Stacey Fuentes  BUL:[id number] DOB: August 15, 1935 DOA: 12/09/2020 PCP: Harlan Stains, MD    Brief Narrative:  85 y.o. female with a PMH significant for dementia, type II DM, HLD, recent SCA CVA. They presented from home to the ED on 12/09/2020 with acute change in mental status associated with N/V, abdominal pain x 1 days. In the ED, it was found that they had urinary tract infection associated with Foley catheter. They were treated with empiric antibiotics, supportive care.  Patient was admitted to medicine service for further workup and management of sepsis as outlined in detail below.  Daughter stating on presentation to the ED on 12/09/2020 patient had what she describes as coffee-ground emesis and although hemoglobin has remained stable and with no further coffee-ground emesis is insistent on further evaluation by GI.  GI consulted.  Assessment & Plan:   Principal Problem:   Sepsis due to Pseudomonas [facility]) Active Problems:   Essential hypertension   Cerebellar infarct ([place])   Dementia without behavioral disturbance (HCC)   Type 2 diabetes mellitus with other specified complication (HCC)   UTI (urinary tract infection)   Coffee ground emesis   GERD (gastroesophageal reflux disease)  #1 sepsis secondary to Pseudomonas UTI with acute metabolic encephalopathy, POA -Patient on presentation noted-acute metabolic encephalopathy noted to be more confused than baseline. -COVID-19 PCR negative. -Urine cultures with Pseudomonas, found to be pan-sensitive -Blood cultures pending with no growth to date. -Was on IV Rocephin and transition to IV Zosyn  -afebrile, no leukocytosis  2.  Type 2 diabetes mellitus with long-term insulin use -Hemoglobin A1c 8.3 99/28/2022) -CBG 217 this morning. -Continue current dose of Semglee 12 units daily, SSI. -glycemic trends stable, reviewed  3.  History of recent CVA with residual weakness -Pt is continued on aspirin for secondary  stroke prophylaxis. -Statin.  4.  Hematuria -Urinalysis with 11-20 RBCs. -Likely secondary to acute UTI. -currently on IV antibiotics.  5. Reported possible Coffee-ground emesis -Noted per daughter when patient was seen in the ED on 12/09/2020, daughter states she witnessed patient vomiting blood/brownish coffee-ground emesis. -No further hematemesis or coffee-ground emesis noted during this hospitalization thus far and pt has remained hemodynamically stable -Hgb trends reviewed, have remained stable with no evidence of blood loss. -Patient is continued on IV PPI. -Family had reportedly been insistent on further evaluation and requesting GI consultation. -GI was consulted for possible EGD. Given recent CVA, case was discussed with on-call Neurologist, Dr. Rory Percy, who recommends holding off on non-emergent EGD for at least 3 months, citing increased risk for peri-procedural complications, including new strokes, if performed sooner -As such, GI recommended outpatient f/u for consideration for EGD once she is 3 months out from CVA. GI recommendation for BID PPI x 8 weeks. GI has since signed off  6.  Hypertension -BP noted to be borderline but stable -Discontinued Norvasc.  7.  Dementia -Continue home regimen Namenda.  8.  GERD -Continued on PPI.  9.  Normocytic anemia -Patient with no overt bleeding during this hospitalization. -Continue to follow CBC trends -Transfusion threshold hemoglobin < 7.  DVT prophylaxis: Lovenox subq Code Status: Full Family Communication: Pt in room  Status is: Inpatient  Remains inpatient appropriate because: Severity of illness   Consultants:  GI  Procedures:    Antimicrobials: Anti-infectives (From admission, onward)    Start     Dose/Rate Route Frequency Ordered Stop   12/10/20 2200  piperacillin-tazobactam (ZOSYN) IVPB 3.375 g  3.375 g 12.5 mL/hr over 240 Minutes Intravenous Every 8 hours 12/10/20 1644     12/10/20 1645   piperacillin-tazobactam (ZOSYN) IVPB 3.375 g        3.375 g 100 mL/hr over 30 Minutes Intravenous  Once 12/10/20 1644 12/10/20 1902   12/10/20 0000  cefTRIAXone (ROCEPHIN) 2 g in sodium chloride 0.9 % 100 mL IVPB  Status:  Discontinued        2 g 200 mL/hr over 30 Minutes Intravenous Every 24 hours 12/09/20 2354 12/10/20 1644   12/09/20 2330  cefTRIAXone (ROCEPHIN) 1 g in sodium chloride 0.9 % 100 mL IVPB  Status:  Discontinued        1 g 200 mL/hr over 30 Minutes Intravenous  Once 12/09/20 2329 12/09/20 2357       Subjective: Pleasantly confused this AM  Objective: Vitals:   12/11/20 1350 12/11/20 1954 12/12/20 0524 12/12/20 1211  BP: 106/60 132/64 123/64 (!) 146/61  Pulse: 100 (!) 106 89 (!) 104  Resp: 16 17 16 17   Temp: 98.8 F (37.1 C) 98.9 F (37.2 C) 98.5 F (36.9 C) (!) 97.5 F (36.4 C)  TempSrc: Axillary Oral Oral   SpO2: 100% 100% 98%   Weight:      Height:        Intake/Output Summary (Last 24 hours) at 12/12/2020 1745 Last data filed at 12/12/2020 1500 Gross per 24 hour  Intake 3777.2 ml  Output 2 ml  Net 3775.2 ml   Filed Weights   12/10/20 1700 12/10/20 2250  Weight: 72.6 kg 66.7 kg    Examination: General exam: Awake, laying in bed, in nad Respiratory system: Normal respiratory effort, no wheezing Cardiovascular system: regular rate, s1, s2 Gastrointestinal system: Soft, nondistended, positive BS Central nervous system: CN2-12 grossly intact, strength intact Extremities: Perfused, no clubbing Skin: Normal skin turgor, no notable skin lesions seen Psychiatry: Mood normal // no visual hallucinations   Data Reviewed: I have personally reviewed following labs and imaging studies  CBC: Recent Labs  Lab 12/08/20 2018 12/09/20 2030 12/10/20 0301 12/11/20 0849 12/12/20 0446  WBC 8.7 9.1 8.8 10.1 7.1  NEUTROABS 6.9 7.1  --  7.4 4.1  HGB 11.6* 12.2 11.6* 11.6* 10.4*  HCT 34.8* 37.7 35.9* 35.5* 32.9*  MCV 95.3 96.4 96.0 94.9 97.9  PLT 207 246 250  233 509   Basic Metabolic Panel: Recent Labs  Lab 12/08/20 2018 12/09/20 2030 12/10/20 0301 12/11/20 0849 12/12/20 0446  NA 134* 137 134* 137 138  K 4.5 4.7 4.3 3.7 3.0*  CL 105 102 99 103 106  CO2 24 28 24 24 23   GLUCOSE 228* 214* 298* 221* 121*  BUN 10 14 14 11  7*  CREATININE 0.99 0.99 0.81 0.86 0.77  CALCIUM 8.9 9.2 8.6* 8.6* 8.4*  MG  --   --  1.9 1.8 1.6*  PHOS  --   --  3.2  --   --    GFR: Estimated Creatinine Clearance: 47.7 mL/min (by C-G formula based on SCr of 0.77 mg/dL). Liver Function Tests: Recent Labs  Lab 12/08/20 2018 12/09/20 2030  AST 26 25  ALT 24 25  ALKPHOS 60 65  BILITOT 0.5 1.0  PROT 5.6* 6.5  ALBUMIN 2.6* 3.3*   Recent Labs  Lab 12/08/20 2018  LIPASE 20   No results for input(s): AMMONIA in the last 168 hours. Coagulation Profile: No results for input(s): INR, PROTIME in the last 168 hours. Cardiac Enzymes: No results for input(s): CKTOTAL, CKMB, CKMBINDEX,  TROPONINI in the last 168 hours. BNP (last 3 results) No results for input(s): PROBNP in the last 8760 hours. HbA1C: No results for input(s): HGBA1C in the last 72 hours. CBG: Recent Labs  Lab 12/11/20 1648 12/11/20 1957 12/12/20 0842 12/12/20 1111 12/12/20 1653  GLUCAP 110* 124* 130* 110* 139*   Lipid Profile: No results for input(s): CHOL, HDL, LDLCALC, TRIG, CHOLHDL, LDLDIRECT in the last 72 hours. Thyroid Function Tests: No results for input(s): TSH, T4TOTAL, FREET4, T3FREE, THYROIDAB in the last 72 hours. Anemia Panel: No results for input(s): VITAMINB12, FOLATE, FERRITIN, TIBC, IRON, RETICCTPCT in the last 72 hours. Sepsis Labs: Recent Labs  Lab 12/09/20 2030  LATICACIDVEN 1.4    Recent Results (from the past 240 hour(s))  Urine Culture     Status: Abnormal   Collection Time: 12/08/20  7:56 PM   Specimen: Urine, Catheterized  Result Value Ref Range Status   Specimen Description URINE, CATHETERIZED  Final   Special Requests NONE  Final   Culture (A)   Final    >=100,000 COLONIES/mL PSEUDOMONAS AERUGINOSA 60,000 COLONIES/mL AEROCOCCUS URINAE Standardized susceptibility testing for this organism is not available. Performed at [facility], [place] [address], [place], [place] [address]    Report Status 12/11/2020 FINAL  Final   Organism ID, Bacteria PSEUDOMONAS AERUGINOSA (A)  Final      Susceptibility   Pseudomonas aeruginosa - MIC*    CEFTAZIDIME 4 SENSITIVE Sensitive     CIPROFLOXACIN <=0.25 SENSITIVE Sensitive     GENTAMICIN <=1 SENSITIVE Sensitive     IMIPENEM 1 SENSITIVE Sensitive     PIP/TAZO 8 SENSITIVE Sensitive     CEFEPIME 2 SENSITIVE Sensitive     * >=100,000 COLONIES/mL PSEUDOMONAS AERUGINOSA  Urine Culture     Status: Abnormal   Collection Time: 12/09/20  9:37 PM   Specimen: Urine, Catheterized  Result Value Ref Range Status   Specimen Description   Final    URINE, CATHETERIZED Performed at [facility], [place] [address], [place], [place] [address]    Special Requests   Final    NONE Performed at [facility], [place] [address], [place], [place] [address]    Culture >=100,000 COLONIES/mL PSEUDOMONAS AERUGINOSA (A)  Final   Report Status 12/12/2020 FINAL  Final   Organism ID, Bacteria PSEUDOMONAS AERUGINOSA (A)  Final      Susceptibility   Pseudomonas aeruginosa - MIC*    CEFTAZIDIME 4 SENSITIVE Sensitive     CIPROFLOXACIN <=0.25 SENSITIVE Sensitive     GENTAMICIN <=1 SENSITIVE Sensitive     IMIPENEM 1 SENSITIVE Sensitive     PIP/TAZO 8 SENSITIVE Sensitive     CEFEPIME 2 SENSITIVE Sensitive     * >=100,000 COLONIES/mL PSEUDOMONAS AERUGINOSA  Resp Panel by RT-PCR (Flu A&B, Covid) Nasopharyngeal Swab     Status: None   Collection Time: 12/10/20 12:05 AM   Specimen: Nasopharyngeal Swab; Nasopharyngeal(NP) swabs in vial transport medium  Result Value Ref Range Status   SARS Coronavirus 2 by RT PCR NEGATIVE NEGATIVE Final    Comment: (NOTE) SARS-CoV-2 target nucleic acids are  NOT DETECTED.  The SARS-CoV-2 RNA is generally detectable in upper respiratory specimens during the acute phase of infection. The lowest concentration of SARS-CoV-2 viral copies this assay can detect is 138 copies/mL. A negative result does not preclude SARS-Cov-2 infection and should not be used as the sole basis for treatment or other patient management decisions. A negative result may occur with  improper specimen  collection/handling, submission of specimen other than nasopharyngeal swab, presence of viral mutation(s) within the areas targeted by this assay, and inadequate number of viral copies(<138 copies/mL). A negative result must be combined with clinical observations, patient history, and epidemiological information. The expected result is Negative.  Fact Sheet for Patients:  EntrepreneurPulse.com.au  Fact Sheet for Healthcare Providers:  IncredibleEmployment.be  This test is no t yet approved or cleared by the [place] FDA and  has been authorized for detection and/or diagnosis of SARS-CoV-2 by FDA under an Emergency Use Authorization (EUA). This EUA will remain  in effect (meaning this test can be used) for the duration of the COVID-19 declaration under Section 564(b)(1) of the Act, 21 U.S.C.section 360bbb-3(b)(1), unless the authorization is terminated  or revoked sooner.       Influenza A by PCR NEGATIVE NEGATIVE Final   Influenza B by PCR NEGATIVE NEGATIVE Final    Comment: (NOTE) The Xpert Xpress SARS-CoV-2/FLU/RSV plus assay is intended as an aid in the diagnosis of influenza from Nasopharyngeal swab specimens and should not be used as a sole basis for treatment. Nasal washings and aspirates are unacceptable for Xpert Xpress SARS-CoV-2/FLU/RSV testing.  Fact Sheet for Patients: EntrepreneurPulse.com.au  Fact Sheet for Healthcare Providers: IncredibleEmployment.be  This test is not  yet approved or cleared by the [place] FDA and has been authorized for detection and/or diagnosis of SARS-CoV-2 by FDA under an Emergency Use Authorization (EUA). This EUA will remain in effect (meaning this test can be used) for the duration of the COVID-19 declaration under Section 564(b)(1) of the Act, 21 U.S.C. section 360bbb-3(b)(1), unless the authorization is terminated or revoked.  Performed at [facility], [place] [address] [place], [place] [address]   Culture, blood (routine x 2)     Status: None (Preliminary result)   Collection Time: 12/10/20  6:20 PM   Specimen: BLOOD RIGHT HAND  Result Value Ref Range Status   Specimen Description   Final    BLOOD RIGHT HAND Performed at [facility] [address] [place], [place] [address]    Special Requests   Final    BOTTLES DRAWN AEROBIC AND ANAEROBIC Blood Culture results may not be optimal due to an excessive volume of blood received in culture bottles Performed at [facility] [address] [place], [place] [address]    Culture   Final    NO GROWTH 2 DAYS Performed at [facility] [address], [place], [place] [address]    Report Status PENDING  Incomplete  Culture, blood (routine x 2)     Status: None (Preliminary result)   Collection Time: 12/10/20  6:20 PM   Specimen: BLOOD RIGHT FOREARM  Result Value Ref Range Status   Specimen Description   Final    BLOOD RIGHT FOREARM Performed at [facility] [address], [place], [place] [address]    Special Requests   Final    BOTTLES DRAWN AEROBIC ONLY Blood Culture adequate volume Performed at [facility] [address], [place], [place] [address]    Culture   Final    NO GROWTH 2 DAYS Performed at [facility] [address], [place],  [address]    Report Status PENDING  Incomplete     Radiology Studies: No results found.  Scheduled  Meds:  aspirin  81 mg Oral Daily   enoxaparin (LOVENOX) injection  40 mg Subcutaneous Q24H   insulin  aspart  0-9 Units Subcutaneous TID WC   insulin glargine-yfgn  12 Units Subcutaneous Daily   memantine  5 mg Oral Daily   pantoprazole (PROTONIX) IV  40 mg Intravenous Q12H   potassium chloride  40 mEq Oral BID   vitamin B-12  1,000 mcg Oral Daily   Continuous Infusions:  sodium chloride 125 mL/hr at 12/12/20 0250   piperacillin-tazobactam (ZOSYN)  IV 3.375 g (12/12/20 1452)   promethazine (PHENERGAN) injection (IM or IVPB) Stopped (12/10/20 1909)     LOS: 3 days   Marylu Lund, MD [facility] Pager On Amion  If 7PM-7AM, please contact night-coverage 12/12/2020, 5:45 PM

## 2020-12-12 NOTE — Telephone Encounter (Signed)
-----   Message from Sharyn Creamer, MD sent at 12/12/2020  4:32 PM EDT ----- Hi Virlee Stroschein,  Please arrange for GI clinic follow up with me in 1 month to discuss EGD.  Thanks, Lyndee Leo

## 2020-12-13 ENCOUNTER — Other Ambulatory Visit: Payer: Self-pay

## 2020-12-13 DIAGNOSIS — F0390 Unspecified dementia without behavioral disturbance: Secondary | ICD-10-CM | POA: Diagnosis not present

## 2020-12-13 LAB — COMPREHENSIVE METABOLIC PANEL
ALT: 18 U/L (ref 0–44)
ALT: 20 U/L (ref 0–44)
AST: 22 U/L (ref 15–41)
AST: 25 U/L (ref 15–41)
Albumin: 2.9 g/dL — ABNORMAL LOW (ref 3.5–5.0)
Albumin: 3.2 g/dL — ABNORMAL LOW (ref 3.5–5.0)
Alkaline Phosphatase: 49 U/L (ref 38–126)
Alkaline Phosphatase: 53 U/L (ref 38–126)
Anion gap: 6 (ref 5–15)
Anion gap: 6 (ref 5–15)
BUN: 5 mg/dL — ABNORMAL LOW (ref 8–23)
BUN: 6 mg/dL — ABNORMAL LOW (ref 8–23)
CO2: 26 mmol/L (ref 22–32)
CO2: 27 mmol/L (ref 22–32)
Calcium: 8.4 mg/dL — ABNORMAL LOW (ref 8.9–10.3)
Calcium: 8.5 mg/dL — ABNORMAL LOW (ref 8.9–10.3)
Chloride: 105 mmol/L (ref 98–111)
Chloride: 105 mmol/L (ref 98–111)
Creatinine, Ser: 0.79 mg/dL (ref 0.44–1.00)
Creatinine, Ser: 0.88 mg/dL (ref 0.44–1.00)
GFR, Estimated: >60 mL/min (ref >60)
GFR, Estimated: >60 mL/min (ref >60)
Glucose, Bld: 161 mg/dL — ABNORMAL HIGH (ref 70–99)
Glucose, Bld: 166 mg/dL — ABNORMAL HIGH (ref 70–99)
Potassium: 3.1 mmol/L — ABNORMAL LOW (ref 3.5–5.1)
Potassium: 3 mmol/L — ABNORMAL LOW (ref 3.5–5.1)
Sodium: 137 mmol/L (ref 135–145)
Sodium: 138 mmol/L (ref 135–145)
Total Bilirubin: 0.8 mg/dL (ref 0.3–1.2)
Total Bilirubin: 1.1 mg/dL (ref 0.3–1.2)
Total Protein: 5.6 g/dL — ABNORMAL LOW (ref 6.5–8.1)
Total Protein: 6.3 g/dL — ABNORMAL LOW (ref 6.5–8.1)

## 2020-12-13 LAB — CBC
HCT: 34.4 % — ABNORMAL LOW (ref 36.0–46.0)
Hemoglobin: 11.3 g/dL — ABNORMAL LOW (ref 12.0–15.0)
MCH: 30.9 pg (ref 26.0–34.0)
MCHC: 32.8 g/dL (ref 30.0–36.0)
MCV: 94 fL (ref 80.0–100.0)
Platelets: 226 10*3/uL (ref 150–400)
RBC: 3.66 MIL/uL — ABNORMAL LOW (ref 3.87–5.11)
RDW: 12.7 % (ref 11.5–15.5)
WBC: 5.5 10*3/uL (ref 4.0–10.5)
nRBC: 0 % (ref 0.0–0.2)

## 2020-12-13 LAB — GLUCOSE, CAPILLARY
Glucose-Capillary: 145 mg/dL — ABNORMAL HIGH (ref 70–99)
Glucose-Capillary: 161 mg/dL — ABNORMAL HIGH (ref 70–99)
Glucose-Capillary: 111 mg/dL — ABNORMAL HIGH (ref 70–99)
Glucose-Capillary: 100 mg/dL — ABNORMAL HIGH (ref 70–99)

## 2020-12-13 LAB — MAGNESIUM: Magnesium: 2.2 mg/dL (ref 1.7–2.4)

## 2020-12-13 MED ORDER — POTASSIUM CHLORIDE CRYS ER 20 MEQ PO TBCR
60.0000 meq | EXTENDED_RELEASE_TABLET | Freq: Once | ORAL | Status: AC
  Administered 2020-12-13: 60 meq via ORAL
  Filled 2020-12-13: qty 3

## 2020-12-13 MED ORDER — POTASSIUM CHLORIDE CRYS ER 20 MEQ PO TBCR
40.0000 meq | EXTENDED_RELEASE_TABLET | ORAL | Status: DC
  Administered 2020-12-13: 40 meq via ORAL
  Filled 2020-12-13: qty 2

## 2020-12-13 NOTE — Progress Notes (Signed)
PROGRESS NOTE    Stacey Fuentes  NWG:[id number] DOB: 1935-03-10 DOA: 12/09/2020 PCP: Stacey Stains, MD    Brief Narrative:  85 y.o. female with a PMH significant for dementia, type II DM, HLD, recent SCA CVA. They presented from home to the ED on 12/09/2020 with acute change in mental status associated with N/V, abdominal pain x 1 days. In the ED, it was found that they had urinary tract infection associated with Foley catheter. They were treated with empiric antibiotics, supportive care.  Patient was admitted to medicine service for further workup and management of sepsis as outlined in detail below.  Daughter stating on presentation to the ED on 12/09/2020 patient had what she describes as coffee-ground emesis and although hemoglobin has remained stable and with no further coffee-ground emesis is insistent on further evaluation by GI.  GI consulted.  Assessment & Plan:   Principal Problem:   Sepsis due to Pseudomonas [facility]) Active Problems:   Essential hypertension   Cerebellar infarct ([place])   Dementia without behavioral disturbance (HCC)   Type 2 diabetes mellitus with other specified complication (HCC)   UTI (urinary tract infection)   Coffee ground emesis   GERD (gastroesophageal reflux disease)  #1 sepsis secondary to Pseudomonas UTI with acute metabolic encephalopathy, POA -Patient on presentation noted-acute metabolic encephalopathy noted to be more confused than baseline. -COVID-19 PCR negative. -Urine cultures with Pseudomonas, found to be pan-sensitive -Blood cultures pending with no growth to date. -Was on IV Rocephin and transitioned to IV Zosyn  -remains afebrile, no leukocytosis  2.  Type 2 diabetes mellitus with long-term insulin use -Hemoglobin A1c 8.3 99/28/2022) -Continue current dose of Semglee 12 units daily, SSI. -glycemic trends remain stable, reviewed  3.  History of recent CVA with residual weakness -Pt is continued on aspirin for secondary stroke  prophylaxis. -Statin.  4.  Hematuria -Urinalysis with 11-20 RBCs. -Likely secondary to acute UTI. -currently on IV antibiotics.  5. Reported possible Coffee-ground emesis -Noted per daughter when patient was seen in the ED on 12/09/2020, daughter states she witnessed patient vomiting blood/brownish coffee-ground emesis. -No further hematemesis or coffee-ground emesis noted during this hospitalization thus far and pt has remained hemodynamically stable -Hgb trends reviewed, have remained stable with no evidence of blood loss. -Patient is continued on IV PPI. -Family had reportedly been insistent on further evaluation and requesting GI consultation. -GI was then consulted for possible EGD. Given recent CVA, case was discussed with on-call Neurologist, Dr. Rory Percy, who recommends holding off on non-emergent EGD for at least 3 months, citing increased risk for peri-procedural complications, including new strokes, if performed sooner -As such, GI recommended outpatient f/u for consideration for EGD once she is 3 months out from CVA. GI recommendation for BID PPI x 8 weeks. GI has since signed off -Had discussed with and updated family regarding above recommendations on 12/12/2020 over phone, spoke with daughter Stacey Fuentes.  Family is in full agreement.  6.  Hypertension -BP noted to be borderline but stable -Discontinued Norvasc. -Avoid hypotension given recent CVA  7.  Dementia -Continue home regimen Namenda. -Suspect related to poor p.o. intake.  8.  GERD -Continued on PPI.  9.  Normocytic anemia -Patient with no overt bleeding during this hospitalization. -Continue to follow CBC trends -Hemoglobin has trended up to 11.3. -No evidence of acute blood loss, staff reports brown-colored stools with no evidence of melena or blood  10.  Constipation -Daughter over phone reports concerns of constipation recently -Resolved.  Multiple medium size stools  noted overnight -Patient is now continued  on stool softener  11.  Hypomagnesemia, hypokalemia -Suspect secondary to chronic malnutrition, in setting of known dementia -Continue to replace as needed  DVT prophylaxis: Lovenox subq Code Status: Full Family Communication: Pt in room  Status is: Inpatient  Remains inpatient appropriate because: Severity of illness   Consultants:  GI Discussed case with on-call neurologist  Procedures:    Antimicrobials: Anti-infectives (From admission, onward)    Start     Dose/Rate Route Frequency Ordered Stop   12/10/20 2200  piperacillin-tazobactam (ZOSYN) IVPB 3.375 g        3.375 g 12.5 mL/hr over 240 Minutes Intravenous Every 8 hours 12/10/20 1644     12/10/20 1645  piperacillin-tazobactam (ZOSYN) IVPB 3.375 g        3.375 g 100 mL/hr over 30 Minutes Intravenous  Once 12/10/20 1644 12/10/20 1902   12/10/20 0000  cefTRIAXone (ROCEPHIN) 2 g in sodium chloride 0.9 % 100 mL IVPB  Status:  Discontinued        2 g 200 mL/hr over 30 Minutes Intravenous Every 24 hours 12/09/20 2354 12/10/20 1644   12/09/20 2330  cefTRIAXone (ROCEPHIN) 1 g in sodium chloride 0.9 % 100 mL IVPB  Status:  Discontinued        1 g 200 mL/hr over 30 Minutes Intravenous  Once 12/09/20 2329 12/09/20 2357       Subjective: Conversant today, no complaints  Objective: Vitals:   12/12/20 1211 12/12/20 2014 12/13/20 0345 12/13/20 1150  BP: (!) 146/61 (!) 156/80 (!) 160/95 119/75  Pulse: (!) 104 100 (!) 110 99  Resp: 17 18 18 18   Temp: (!) 97.5 F (36.4 C) 98.1 F (36.7 C) 98.7 F (37.1 C) 98.6 F (37 C)  TempSrc:  Oral Oral Oral  SpO2:  99% 99% 98%  Weight:      Height:        Intake/Output Summary (Last 24 hours) at 12/13/2020 1806 Last data filed at 12/13/2020 1652 Gross per 24 hour  Intake 0 ml  Output 1300 ml  Net -1300 ml    Filed Weights   12/10/20 1700 12/10/20 2250  Weight: 72.6 kg 66.7 kg    Examination: General exam: Conversant, in no acute distress Respiratory system: normal  chest rise, clear, no audible wheezing Cardiovascular system: regular rhythm, s1-s2 Gastrointestinal system: Nondistended, nontender, pos BS Central nervous system: No seizures, no tremors Extremities: No cyanosis, no joint deformities Skin: No rashes, no pallor Psychiatry: Difficult to assess given mentation  Data Reviewed: I have personally reviewed following labs and imaging studies  CBC: Recent Labs  Lab 12/08/20 2018 12/09/20 2030 12/10/20 0301 12/11/20 0849 12/12/20 0446 12/13/20 1255  WBC 8.7 9.1 8.8 10.1 7.1 5.5  NEUTROABS 6.9 7.1  --  7.4 4.1  --   HGB 11.6* 12.2 11.6* 11.6* 10.4* 11.3*  HCT 34.8* 37.7 35.9* 35.5* 32.9* 34.4*  MCV 95.3 96.4 96.0 94.9 97.9 94.0  PLT 207 246 250 233 201 496    Basic Metabolic Panel: Recent Labs  Lab 12/10/20 0301 12/11/20 0849 12/12/20 0446 12/13/20 1009 12/13/20 1255  NA 134* 137 138 137 138  K 4.3 3.7 3.0* 3.1* 3.0*  CL 99 103 106 105 105  CO2 24 24 23 26 27   GLUCOSE 298* 221* 121* 161* 166*  BUN 14 11 7* 5* 6*  CREATININE 0.81 0.86 0.77 0.79 0.88  CALCIUM 8.6* 8.6* 8.4* 8.4* 8.5*  MG 1.9 1.8 1.6*  --  2.2  PHOS 3.2  --   --   --   --     GFR: Estimated Creatinine Clearance: 43.4 mL/min (by C-G formula based on SCr of 0.88 mg/dL). Liver Function Tests: Recent Labs  Lab 12/08/20 2018 12/09/20 2030 12/13/20 1009 12/13/20 1255  AST 26 25 22 25   ALT 24 25 18 20   ALKPHOS 60 65 49 53  BILITOT 0.5 1.0 0.8 1.1  PROT 5.6* 6.5 5.6* 6.3*  ALBUMIN 2.6* 3.3* 2.9* 3.2*    Recent Labs  Lab 12/08/20 2018  LIPASE 20    No results for input(s): AMMONIA in the last 168 hours. Coagulation Profile: No results for input(s): INR, PROTIME in the last 168 hours. Cardiac Enzymes: No results for input(s): CKTOTAL, CKMB, CKMBINDEX, TROPONINI in the last 168 hours. BNP (last 3 results) No results for input(s): PROBNP in the last 8760 hours. HbA1C: No results for input(s): HGBA1C in the last 72 hours. CBG: Recent Labs  Lab  12/12/20 1653 12/12/20 2149 12/13/20 0724 12/13/20 1147 12/13/20 1650  GLUCAP 139* 167* 145* 161* 111*    Lipid Profile: No results for input(s): CHOL, HDL, LDLCALC, TRIG, CHOLHDL, LDLDIRECT in the last 72 hours. Thyroid Function Tests: No results for input(s): TSH, T4TOTAL, FREET4, T3FREE, THYROIDAB in the last 72 hours. Anemia Panel: No results for input(s): VITAMINB12, FOLATE, FERRITIN, TIBC, IRON, RETICCTPCT in the last 72 hours. Sepsis Labs: Recent Labs  Lab 12/09/20 2030  LATICACIDVEN 1.4     Recent Results (from the past 240 hour(s))  Urine Culture     Status: Abnormal   Collection Time: 12/08/20  7:56 PM   Specimen: Urine, Catheterized  Result Value Ref Range Status   Specimen Description URINE, CATHETERIZED  Final   Special Requests NONE  Final   Culture (A)  Final    >=100,000 COLONIES/mL PSEUDOMONAS AERUGINOSA 60,000 COLONIES/mL AEROCOCCUS URINAE Standardized susceptibility testing for this organism is not available. Performed at [facility], [place] [address] [place], [place] [address]    Report Status 12/11/2020 FINAL  Final   Organism ID, Bacteria PSEUDOMONAS AERUGINOSA (A)  Final      Susceptibility   Pseudomonas aeruginosa - MIC*    CEFTAZIDIME 4 SENSITIVE Sensitive     CIPROFLOXACIN <=0.25 SENSITIVE Sensitive     GENTAMICIN <=1 SENSITIVE Sensitive     IMIPENEM 1 SENSITIVE Sensitive     PIP/TAZO 8 SENSITIVE Sensitive     CEFEPIME 2 SENSITIVE Sensitive     * >=100,000 COLONIES/mL PSEUDOMONAS AERUGINOSA  Urine Culture     Status: Abnormal   Collection Time: 12/09/20  9:37 PM   Specimen: Urine, Catheterized  Result Value Ref Range Status   Specimen Description   Final    URINE, CATHETERIZED Performed at [facility], [place] [address], [place], [place] [address]    Special Requests   Final    NONE Performed at [facility], [place] [address] [place], [place] [address]    Culture >=100,000 COLONIES/mL  PSEUDOMONAS AERUGINOSA (A)  Final   Report Status 12/12/2020 FINAL  Final   Organism ID, Bacteria PSEUDOMONAS AERUGINOSA (A)  Final      Susceptibility   Pseudomonas aeruginosa - MIC*    CEFTAZIDIME 4 SENSITIVE Sensitive     CIPROFLOXACIN <=0.25 SENSITIVE Sensitive     GENTAMICIN <=1 SENSITIVE Sensitive     IMIPENEM 1 SENSITIVE Sensitive     PIP/TAZO 8 SENSITIVE Sensitive     CEFEPIME 2 SENSITIVE Sensitive     * >=  100,000 COLONIES/mL PSEUDOMONAS AERUGINOSA  Resp Panel by RT-PCR (Flu A&B, Covid) Nasopharyngeal Swab     Status: None   Collection Time: 12/10/20 12:05 AM   Specimen: Nasopharyngeal Swab; Nasopharyngeal(NP) swabs in vial transport medium  Result Value Ref Range Status   SARS Coronavirus 2 by RT PCR NEGATIVE NEGATIVE Final    Comment: (NOTE) SARS-CoV-2 target nucleic acids are NOT DETECTED.  The SARS-CoV-2 RNA is generally detectable in upper respiratory specimens during the acute phase of infection. The lowest concentration of SARS-CoV-2 viral copies this assay can detect is 138 copies/mL. A negative result does not preclude SARS-Cov-2 infection and should not be used as the sole basis for treatment or other patient management decisions. A negative result may occur with  improper specimen collection/handling, submission of specimen other than nasopharyngeal swab, presence of viral mutation(s) within the areas targeted by this assay, and inadequate number of viral copies(<138 copies/mL). A negative result must be combined with clinical observations, patient history, and epidemiological information. The expected result is Negative.  Fact Sheet for Patients:  EntrepreneurPulse.com.au  Fact Sheet for Healthcare Providers:  IncredibleEmployment.be  This test is no t yet approved or cleared by the [place] FDA and  has been authorized for detection and/or diagnosis of SARS-CoV-2 by FDA under an Emergency Use Authorization (EUA).  This EUA will remain  in effect (meaning this test can be used) for the duration of the COVID-19 declaration under Section 564(b)(1) of the Act, 21 U.S.C.section 360bbb-3(b)(1), unless the authorization is terminated  or revoked sooner.       Influenza A by PCR NEGATIVE NEGATIVE Final   Influenza B by PCR NEGATIVE NEGATIVE Final    Comment: (NOTE) The Xpert Xpress SARS-CoV-2/FLU/RSV plus assay is intended as an aid in the diagnosis of influenza from Nasopharyngeal swab specimens and should not be used as a sole basis for treatment. Nasal washings and aspirates are unacceptable for Xpert Xpress SARS-CoV-2/FLU/RSV testing.  Fact Sheet for Patients: EntrepreneurPulse.com.au  Fact Sheet for Healthcare Providers: IncredibleEmployment.be  This test is not yet approved or cleared by the [place] FDA and has been authorized for detection and/or diagnosis of SARS-CoV-2 by FDA under an Emergency Use Authorization (EUA). This EUA will remain in effect (meaning this test can be used) for the duration of the COVID-19 declaration under Section 564(b)(1) of the Act, 21 U.S.C. section 360bbb-3(b)(1), unless the authorization is terminated or revoked.  Performed at [facility], [place] [address] [place], [place] [address]   Culture, blood (routine x 2)     Status: None (Preliminary result)   Collection Time: 12/10/20  6:20 PM   Specimen: BLOOD RIGHT HAND  Result Value Ref Range Status   Specimen Description   Final    BLOOD RIGHT HAND Performed at [place] [address] [place], [place] [address]    Special Requests   Final    BOTTLES DRAWN AEROBIC AND ANAEROBIC Blood Culture results may not be optimal due to an excessive volume of blood received in culture bottles Performed at [place] [address], [place], [place] [address]    Culture   Final    NO GROWTH 3 DAYS Performed at  [facility], [place] [address] [place], [place] [address]    Report Status PENDING  Incomplete  Culture, blood (routine x 2)     Status: None (Preliminary result)   Collection Time: 12/10/20  6:20 PM   Specimen: BLOOD RIGHT FOREARM  Result Value Ref Range Status   Specimen Description   Final    BLOOD RIGHT FOREARM Performed at [place] [address], [place], [place] [address]    Special Requests   Final    BOTTLES DRAWN AEROBIC ONLY Blood Culture adequate volume Performed at [place] [address] [place], [place] [address]    Culture   Final    NO GROWTH 3 DAYS Performed at [facility], [place] [address], [place], [place] [address]    Report Status PENDING  Incomplete      Radiology Studies: No results found.  Scheduled Meds:  aspirin  81 mg Oral Daily   docusate sodium  100 mg Oral BID   enoxaparin (LOVENOX) injection  40 mg Subcutaneous Q24H   insulin aspart  0-9 Units Subcutaneous TID WC   insulin glargine-yfgn  12 Units Subcutaneous Daily   memantine  5 mg Oral Daily   pantoprazole (PROTONIX) IV  40 mg Intravenous Q12H   vitamin B-12  1,000 mcg Oral Daily   Continuous Infusions:  piperacillin-tazobactam (ZOSYN)  IV 3.375 g (12/13/20 1424)   promethazine (PHENERGAN) injection (IM or IVPB) Stopped (12/10/20 1909)     LOS: 4 days   Marylu Lund, MD [facility] Pager On Amion  If 7PM-7AM, please contact night-coverage 12/13/2020, 6:06 PM

## 2020-12-13 NOTE — Progress Notes (Signed)
Physical Therapy Treatment Patient Details Name: Stacey Fuentes MRN: [id number] DOB: 09-01-1935 Today's Date: 12/13/2020   History of Present Illness 85 y.o. female presenting to ED 10/30 with worsening AMS, abdominal pain and N/V x1 day. Patient admitted with catheter associated UTI. Recent hospital admission 10/18-10/22 wiht AMS and DKA. PMHx significant for dementia, DMII, HLD, benign essential tremor, CKD II, HLD, OA and recent CVA.    PT Comments    Pt was difficult to arouse.  Wearing B mittens and a waist restraint.  Mostly lethargic and required increased assist.  Assisted OOB to recliner only.  General bed mobility comments: pt required Total Assist to transition to EOB using bed pad.  Poor collasped sitting posture.  Poor eye contact. General transfer comment: pt was unable to grasp walker due to decreased level of alerness so assisted from elevated bed to recliner via "Bear Hug" SPS 1/4 pivot. Positioned in recliner upright and to comfort when MD came in.   Per chart review, daughter plans to take pt home via [facility]AR.   Recommendations for follow up therapy are one component of a multi-disciplinary discharge planning process, led by the attending physician.  Recommendations may be updated based on patient status, additional functional criteria and insurance authorization.  Follow Up Recommendations  Skilled nursing-short term rehab (<3 hours/day) (per chart review, daughter plans to take pt home)     Assistance Recommended at Discharge Frequent or constant Supervision/Assistance  Equipment Recommendations  None recommended by PT    Recommendations for Other Services       Precautions / Restrictions Precautions Precautions: Fall Precaution Comments: Bilateral mitten restraints     Mobility  Bed Mobility Overal bed mobility: Needs Assistance Bed Mobility: Supine to Sit     Supine to sit: Total assist     General bed mobility comments: pt required Total Assist to  transition to EOB using bed pad.  Poor collasped sitting posture.  Poor eye contact.    Transfers Overall transfer level: Needs assistance Equipment used: None Transfers: Stand Pivot Transfers   Stand pivot transfers: Max assist;Total assist         General transfer comment: pt was unable to grasp walker due to decreased level of alerness so assisted from elevated bed to recliner via "Bear Hug" SPS 1/4 pivot.    Ambulation/Gait             General Gait Details: due to poor transfer ability, did not attempt gait   Stairs             Wheelchair Mobility    Modified Rankin (Stroke Patients Only)       Balance                                            Cognition Arousal/Alertness: Lethargic Behavior During Therapy: Flat affect                                   General Comments: sleepy/sluggish, difficult to arouse.  She did follow simple repeat commands but had difficulty maintaining alertness.        Exercises      General Comments        Pertinent Vitals/Pain Pain Assessment: No/denies pain    Home Living  Prior Function            PT Goals (current goals can now be found in the care plan section) Progress towards PT goals: Progressing toward goals    Frequency    Min 3X/week      PT Plan Current plan remains appropriate    Co-evaluation              AM-PAC PT "6 Clicks" Mobility   Outcome Measure  Help needed turning from your back to your side while in a flat bed without using bedrails?: A Lot Help needed moving from lying on your back to sitting on the side of a flat bed without using bedrails?: A Lot Help needed moving to and from a bed to a chair (including a wheelchair)?: A Lot Help needed standing up from a chair using your arms (e.g., wheelchair or bedside chair)?: A Lot Help needed to walk in hospital room?: Total Help needed climbing 3-5 steps  with a railing? : Total 6 Click Score: 10    End of Session Equipment Utilized During Treatment: Gait belt Activity Tolerance: Patient limited by lethargy Patient left: in chair;with chair alarm set;with call bell/phone within reach Nurse Communication: Mobility status;Need for lift equipment PT Visit Diagnosis: Muscle weakness (generalized) (M62.81);Other abnormalities of gait and mobility (R26.89)     Time: 7782-4235 PT Time Calculation (min) (ACUTE ONLY): 30 min  Charges:  $Therapeutic Activity: 23-37 mins                    Rica Koyanagi  PTA Acute  Rehabilitation Services Pager      [phone] Office      [phone]

## 2020-12-14 ENCOUNTER — Ambulatory Visit: Payer: Medicare Other | Admitting: Cardiology

## 2020-12-14 DIAGNOSIS — I639 Cerebral infarction, unspecified: Secondary | ICD-10-CM | POA: Diagnosis not present

## 2020-12-14 DIAGNOSIS — F0390 Unspecified dementia without behavioral disturbance: Secondary | ICD-10-CM | POA: Diagnosis not present

## 2020-12-14 LAB — GLUCOSE, CAPILLARY
Glucose-Capillary: 87 mg/dL (ref 70–99)
Glucose-Capillary: 84 mg/dL (ref 70–99)
Glucose-Capillary: 77 mg/dL (ref 70–99)
Glucose-Capillary: 65 mg/dL — ABNORMAL LOW (ref 70–99)
Glucose-Capillary: 87 mg/dL (ref 70–99)

## 2020-12-14 LAB — COMPREHENSIVE METABOLIC PANEL
ALT: 18 U/L (ref 0–44)
AST: 27 U/L (ref 15–41)
Albumin: 2.9 g/dL — ABNORMAL LOW (ref 3.5–5.0)
Alkaline Phosphatase: 46 U/L (ref 38–126)
Anion gap: 10 (ref 5–15)
BUN: 7 mg/dL — ABNORMAL LOW (ref 8–23)
CO2: 24 mmol/L (ref 22–32)
Calcium: 8.9 mg/dL (ref 8.9–10.3)
Chloride: 106 mmol/L (ref 98–111)
Creatinine, Ser: 0.97 mg/dL (ref 0.44–1.00)
GFR, Estimated: 58 mL/min — ABNORMAL LOW (ref >60)
Glucose, Bld: 83 mg/dL (ref 70–99)
Potassium: 3 mmol/L — ABNORMAL LOW (ref 3.5–5.1)
Sodium: 140 mmol/L (ref 135–145)
Total Bilirubin: 0.9 mg/dL (ref 0.3–1.2)
Total Protein: 5.6 g/dL — ABNORMAL LOW (ref 6.5–8.1)

## 2020-12-14 LAB — MAGNESIUM: Magnesium: 2 mg/dL (ref 1.7–2.4)

## 2020-12-14 LAB — POTASSIUM: Potassium: 3.3 mmol/L — ABNORMAL LOW (ref 3.5–5.1)

## 2020-12-14 MED ORDER — INSULIN GLARGINE-YFGN 100 UNIT/ML ~~LOC~~ SOLN
6.0000 [IU] | Freq: Every day | SUBCUTANEOUS | Status: DC
  Administered 2020-12-16 – 2020-12-18 (×3): 6 [IU] via SUBCUTANEOUS
  Filled 2020-12-14 (×4): qty 0.06

## 2020-12-14 MED ORDER — ADULT MULTIVITAMIN W/MINERALS CH
1.0000 | ORAL_TABLET | Freq: Every day | ORAL | Status: DC
  Administered 2020-12-16 – 2020-12-20 (×5): 1 via ORAL
  Filled 2020-12-14 (×6): qty 1

## 2020-12-14 MED ORDER — POTASSIUM CHLORIDE CRYS ER 20 MEQ PO TBCR
40.0000 meq | EXTENDED_RELEASE_TABLET | Freq: Once | ORAL | Status: AC
  Administered 2020-12-14: 40 meq via ORAL
  Filled 2020-12-14: qty 2

## 2020-12-14 MED ORDER — BOOST / RESOURCE BREEZE PO LIQD CUSTOM
1.0000 | Freq: Two times a day (BID) | ORAL | Status: DC
  Administered 2020-12-16 – 2020-12-20 (×5): 1 via ORAL

## 2020-12-14 MED ORDER — ENSURE ENLIVE PO LIQD
237.0000 mL | ORAL | Status: DC
  Administered 2020-12-15 – 2020-12-19 (×2): 237 mL via ORAL

## 2020-12-14 MED ORDER — POTASSIUM CHLORIDE 10 MEQ/100ML IV SOLN
10.0000 meq | INTRAVENOUS | Status: AC
  Administered 2020-12-14 (×4): 10 meq via INTRAVENOUS
  Filled 2020-12-14 (×4): qty 100

## 2020-12-14 NOTE — Progress Notes (Signed)
Occupational Therapy Treatment Patient Details Name: Stacey Fuentes MRN: [id number] DOB: 01/10/1936 Today's Date: 12/14/2020   History of present illness 85 y.o. female presenting to ED 10/30 with worsening AMS, abdominal pain and N/V x1 day. Patient admitted with catheter associated UTI. Recent hospital admission 10/18-10/22 wiht AMS and DKA. PMHx significant for dementia, DMII, HLD, benign essential tremor, CKD II, HLD, OA and recent CVA.   OT comments  Patient progressing and showed improved alertness when music was played in room. Pt able to clap her hands and nod her head to music, pt laughed when OT sang off key, all demonstrating increased engagement in simple bed level activities, compared to previous session. Patient remains limited by cognitive deficits greater than her baseline dementia, lethargy, generalized weakness and decreased activity tolerance along with deficits noted below. Pt continues to demonstrate fair rehab potential and would benefit from continued skilled OT to increase safety and ability to participate in her ADLs and functional mobility to allow pt to return home safely and reduce caregiver burden and fall risk.    Recommendations for follow up therapy are one component of a multi-disciplinary discharge planning process, led by the attending physician.  Recommendations may be updated based on patient status, additional functional criteria and insurance authorization.    Follow Up Recommendations  Home health OT (Family refusing SNF per chart review.)    Assistance Recommended at Discharge Frequent or constant Supervision/Assistance  Equipment Recommendations       Recommendations for Other Services      Precautions / Restrictions Precautions Precautions: Fall Precaution Comments: Bilateral mitten restraints Restrictions Weight Bearing Restrictions: No       Mobility Bed Mobility                    Transfers                          Balance                                           ADL either performed or assessed with clinical judgement   ADL       Grooming: Wash/dry hands;Wash/dry face;Bed level;Maximal assistance;Total assistance;Cueing for sequencing Grooming Details (indicate cue type and reason): Washcloth placed in pt's RT hand. Pt brought to face but then froze. Increased time/cues but would not wash face. Ultimately Total Assist face and hand wash. Pt played gospel music per her choice and had increased alertness, clapping and nodding head to music with visual cues. Pt provided with hand lotion and able to rub lotion in with Max As. Total Assist to apply to arms.                                     Vision   Additional Comments: Keeps eyes closed or very minimally open.   Perception     Praxis Praxis Praxis: Impaired Praxis Impairment Details: Initiation;Motor planning Praxis-Other Comments: Pt attempted to fold washclothes. Tends to tap with fingers and not proceed. Multimodal cues for activity with music playing to promote increased arousal with improved participation. Attempted passing off lotion bottle from RT and LT hand but pt not initiating movements.    Cognition Arousal/Alertness: Lethargic   Overall Cognitive Status: Impaired/Different from baseline Area of Impairment:  Orientation;Attention;Following commands;Problem solving (Per chart, daughter endorses decreased cognition on top of normal dementia.)                 Orientation Level: Disoriented to;Place;Time;Situation Current Attention Level: Focused (for brief periods.)   Following Commands: Follows one step commands inconsistently;Follows one step commands with increased time     Problem Solving: Slow processing;Decreased initiation;Difficulty sequencing;Requires verbal cues;Requires tactile cues General Comments: Improved alertness with gospel music played in room.          Exercises  Other Exercises Other Exercises: AAROM to RT and LT UEs at bed level, all planes x 10 reps each. Pt cooperating and smiling with arm movements and music. \   Shoulder Instructions       General Comments      Pertinent Vitals/ Pain       Faces Pain Scale: No hurt Breathing: normal Negative Vocalization: none Facial Expression: smiling or inexpressive Body Language: relaxed Consolability: no need to console PAINAD Score: 0  Home Living                                          Prior Functioning/Environment              Frequency  Min 2X/week        Progress Toward Goals  OT Goals(current goals can now be found in the care plan section)  Progress towards OT goals: Progressing toward goals  Acute Rehab OT Goals OT Goal Formulation: Patient unable to participate in goal setting Time For Goal Achievement: 12/25/20 Potential to Achieve Goals: [place] Discharge plan needs to be updated;Frequency remains appropriate    Co-evaluation                 AM-PAC OT "6 Clicks" Daily Activity     Outcome Measure   Help from another person eating meals?: Total Help from another person taking care of personal grooming?: Total Help from another person toileting, which includes using toliet, bedpan, or urinal?: Total Help from another person bathing (including washing, rinsing, drying)?: Total Help from another person to put on and taking off regular upper body clothing?: Total Help from another person to put on and taking off regular lower body clothing?: Total 6 Click Score: 6    End of Session    OT Visit Diagnosis: Other abnormalities of gait and mobility (R26.89);Muscle weakness (generalized) (M62.81);Cognitive communication deficit (R41.841);Other symptoms and signs involving cognitive function Symptoms and signs involving cognitive functions: Cerebral infarction   Activity Tolerance Patient limited by lethargy   Patient Left in bed;with  call bell/phone within reach;with bed alarm set;with family/visitor present   Nurse Communication Mobility status        Time: 9381-0175 OT Time Calculation (min): 28 min  Charges: OT Treatments $Self Care/Home Management : 8-22 mins $Cognitive Funtion inital: Initial 15 mins  Anderson Malta, OT Acute Rehab Services Office: [phone] 12/14/2020  Julien Girt 12/14/2020, 10:13 AM

## 2020-12-14 NOTE — Progress Notes (Signed)
PROGRESS NOTE    Stacey Fuentes  QMG:[id number] DOB: [birth year] DOA: 12/09/2020 PCP: Harlan Stains, MD    Brief Narrative:  85 y.o. female with a PMH significant for dementia, type II DM, HLD, recent SCA CVA. They presented from home to the ED on 12/09/2020 with acute change in mental status associated with N/V, abdominal pain x 1 days. In the ED, it was found that they had urinary tract infection associated with Foley catheter. They were treated with empiric antibiotics, supportive care.  Patient was admitted to medicine service for further workup and management of sepsis as outlined in detail below.  Daughter stating on presentation to the ED on 12/09/2020 patient had what she describes as coffee-ground emesis and although hemoglobin has remained stable and with no further coffee-ground emesis is insistent on further evaluation by GI.  GI consulted.  Assessment & Plan:   Principal Problem:   Sepsis due to Pseudomonas [facility]) Active Problems:   Essential hypertension   Cerebellar infarct ([place])   Dementia without behavioral disturbance (HCC)   Type 2 diabetes mellitus with other specified complication (HCC)   UTI (urinary tract infection)   Coffee ground emesis   GERD (gastroesophageal reflux disease)  #1 sepsis secondary to Pseudomonas UTI with acute metabolic encephalopathy, POA -Patient on presentation noted-acute metabolic encephalopathy noted to be more confused than baseline. -COVID-19 PCR negative. -Urine cultures with Pseudomonas, found to be pan-sensitive -Blood cultures pending with no growth to date. -Was on IV Rocephin and transitioned to IV Zosyn, completed course -remains afebrile, no leukocytosis  2.  Type 2 diabetes mellitus with long-term insulin use -Hemoglobin A1c 8.3 99/28/2022) -Continue current dose of Semglee 12 units daily, SSI. -glycemic trends remain stable at this time  3.  History of recent CVA with residual weakness -Pt is continued on aspirin for  secondary stroke prophylaxis. -Statin.  4.  Hematuria -Urinalysis with 11-20 RBCs. -Likely secondary to acute UTI. -currently on IV antibiotics.  5. Reported possible Coffee-ground emesis -Noted per daughter when patient was seen in the ED on 12/09/2020, daughter states she witnessed patient vomiting blood/brownish coffee-ground emesis. -No further hematemesis or coffee-ground emesis noted during this hospitalization thus far and pt has remained hemodynamically stable -Hgb trends reviewed, have remained stable with no evidence of blood loss. -Patient is continued on IV PPI. -Family had reportedly been insistent on further evaluation and requesting GI consultation -GI was then consulted for possible EGD per family request. Given recent CVA, case was discussed with on-call Neurologist, Dr. Rory Percy, who recommends holding off on non-emergent EGD for at least 3 months, citing increased risk for peri-procedural complications, including new strokes, if performed sooner -As such, GI recommended outpatient f/u for consideration for EGD once she is 3 months out from CVA. GI recommendation for BID PPI x 8 weeks. GI has since signed off -Had discussed with and updated family regarding above recommendations on 12/12/2020 over phone, spoke with daughter [name].  Family is in full agreement. -Recent hemoglobin trends have increased, most recently up to 11.3  6.  Hypertension -BP noted to be borderline but stable -Discontinued Norvasc. -Avoid hypotension given recent CVA  7.  Dementia -Continue home regimen Namenda. -Suspect related to poor p.o. intake. -Appreciate Nutrition input. Significant wt loss and decreased interest in food observed likely due to both acute illness and progressive dementia  8.  GERD -Continued on PPI.  9.  Normocytic anemia -Patient with no overt bleeding during this hospitalization. -Continue to follow CBC trends -Hemoglobin has trended up  to 11.3 on most recent check -No  evidence of acute blood loss, staff reports brown-colored stools with no evidence of melena or blood  10.  Constipation -Daughter over phone reports concerns of constipation recently -Resolved.  Multiple medium size stools noted overnight -Patient is now continued on stool softener  11.  Hypomagnesemia, hypokalemia -Suspect secondary to chronic malnutrition, in setting of known dementia -Continue to replace as needed  DVT prophylaxis: Lovenox subq Code Status: Full Family Communication: Pt in room  Status is: Inpatient  Remains inpatient appropriate because: Severity of illness   Consultants:  GI Discussed case with on-call neurologist  Procedures:    Antimicrobials: Anti-infectives (From admission, onward)    Start     Dose/Rate Route Frequency Ordered Stop   12/10/20 2200  piperacillin-tazobactam (ZOSYN) IVPB 3.375 g        3.375 g 12.5 mL/hr over 240 Minutes Intravenous Every 8 hours 12/10/20 1644 12/14/20 2359   12/10/20 1645  piperacillin-tazobactam (ZOSYN) IVPB 3.375 g        3.375 g 100 mL/hr over 30 Minutes Intravenous  Once 12/10/20 1644 12/10/20 1902   12/10/20 0000  cefTRIAXone (ROCEPHIN) 2 g in sodium chloride 0.9 % 100 mL IVPB  Status:  Discontinued        2 g 200 mL/hr over 30 Minutes Intravenous Every 24 hours 12/09/20 2354 12/10/20 1644   12/09/20 2330  cefTRIAXone (ROCEPHIN) 1 g in sodium chloride 0.9 % 100 mL IVPB  Status:  Discontinued        1 g 200 mL/hr over 30 Minutes Intravenous  Once 12/09/20 2329 12/09/20 2357       Subjective: Pleasantly demented. Without complaints  Objective: Vitals:   12/13/20 1150 12/13/20 2110 12/14/20 0549 12/14/20 1153  BP: 119/75 116/87 (!) 142/67 102/79  Pulse: 99 (!) 104 90 87  Resp: 18 20 16 16   Temp: 98.6 F (37 C) 98.8 F (37.1 C) 98 F (36.7 C) 97.8 F (36.6 C)  TempSrc: Oral Oral Axillary Axillary  SpO2: 98% 99% 100% 100%  Weight:      Height:        Intake/Output Summary (Last 24 hours) at  12/14/2020 1822 Last data filed at 12/14/2020 1644 Gross per 24 hour  Intake 738.59 ml  Output --  Net 738.59 ml    Filed Weights   12/10/20 1700 12/10/20 2250  Weight: 72.6 kg 66.7 kg    Examination: General exam: Awake, laying in bed, in nad Respiratory system: Normal respiratory effort, no wheezing Cardiovascular system: regular rate, s1, s2 Gastrointestinal system: Soft, nondistended, positive BS Central nervous system: CN2-12 grossly intact, strength intact Extremities: Perfused, no clubbing Skin: Normal skin turgor, no notable skin lesions seen Psychiatry: uanble to assess given mentation  Data Reviewed: I have personally reviewed following labs and imaging studies  CBC: Recent Labs  Lab 12/08/20 2018 12/09/20 2030 12/10/20 0301 12/11/20 0849 12/12/20 0446 12/13/20 1255  WBC 8.7 9.1 8.8 10.1 7.1 5.5  NEUTROABS 6.9 7.1  --  7.4 4.1  --   HGB 11.6* 12.2 11.6* 11.6* 10.4* 11.3*  HCT 34.8* 37.7 35.9* 35.5* 32.9* 34.4*  MCV 95.3 96.4 96.0 94.9 97.9 94.0  PLT 207 246 250 233 201 867    Basic Metabolic Panel: Recent Labs  Lab 12/10/20 0301 12/11/20 0849 12/12/20 0446 12/13/20 1009 12/13/20 1255 12/14/20 0435  NA 134* 137 138 137 138 140  K 4.3 3.7 3.0* 3.1* 3.0* 3.0*  CL 99 103 106 105 105 106  CO2 24 24 23 26 27 24   GLUCOSE 298* 221* 121* 161* 166* 83  BUN 14 11 7* 5* 6* 7*  CREATININE 0.81 0.86 0.77 0.79 0.88 0.97  CALCIUM 8.6* 8.6* 8.4* 8.4* 8.5* 8.9  MG 1.9 1.8 1.6*  --  2.2 2.0  PHOS 3.2  --   --   --   --   --     GFR: Estimated Creatinine Clearance: 39.3 mL/min (by C-G formula based on SCr of 0.97 mg/dL). Liver Function Tests: Recent Labs  Lab 12/08/20 2018 12/09/20 2030 12/13/20 1009 12/13/20 1255 12/14/20 0435  AST 26 25 22 25 27   ALT 24 25 18 20 18   ALKPHOS 60 65 49 53 46  BILITOT 0.5 1.0 0.8 1.1 0.9  PROT 5.6* 6.5 5.6* 6.3* 5.6*  ALBUMIN 2.6* 3.3* 2.9* 3.2* 2.9*    Recent Labs  Lab 12/08/20 2018  LIPASE 20    No results  for input(s): AMMONIA in the last 168 hours. Coagulation Profile: No results for input(s): INR, PROTIME in the last 168 hours. Cardiac Enzymes: No results for input(s): CKTOTAL, CKMB, CKMBINDEX, TROPONINI in the last 168 hours. BNP (last 3 results) No results for input(s): PROBNP in the last 8760 hours. HbA1C: No results for input(s): HGBA1C in the last 72 hours. CBG: Recent Labs  Lab 12/13/20 1650 12/13/20 2115 12/14/20 0724 12/14/20 1100 12/14/20 1622  GLUCAP 111* 100* 87 84 77    Lipid Profile: No results for input(s): CHOL, HDL, LDLCALC, TRIG, CHOLHDL, LDLDIRECT in the last 72 hours. Thyroid Function Tests: No results for input(s): TSH, T4TOTAL, FREET4, T3FREE, THYROIDAB in the last 72 hours. Anemia Panel: No results for input(s): VITAMINB12, FOLATE, FERRITIN, TIBC, IRON, RETICCTPCT in the last 72 hours. Sepsis Labs: Recent Labs  Lab 12/09/20 2030  LATICACIDVEN 1.4     Recent Results (from the past 240 hour(s))  Urine Culture     Status: Abnormal   Collection Time: 12/08/20  7:56 PM   Specimen: Urine, Catheterized  Result Value Ref Range Status   Specimen Description URINE, CATHETERIZED  Final   Special Requests NONE  Final   Culture (A)  Final    >=100,000 COLONIES/mL PSEUDOMONAS AERUGINOSA 60,000 COLONIES/mL AEROCOCCUS URINAE Standardized susceptibility testing for this organism is not available. Performed at [facility], [place] [address] [place], [place] [address]    Report Status 12/11/2020 FINAL  Final   Organism ID, Bacteria PSEUDOMONAS AERUGINOSA (A)  Final      Susceptibility   Pseudomonas aeruginosa - MIC*    CEFTAZIDIME 4 SENSITIVE Sensitive     CIPROFLOXACIN <=0.25 SENSITIVE Sensitive     GENTAMICIN <=1 SENSITIVE Sensitive     IMIPENEM 1 SENSITIVE Sensitive     PIP/TAZO 8 SENSITIVE Sensitive     CEFEPIME 2 SENSITIVE Sensitive     * >=100,000 COLONIES/mL PSEUDOMONAS AERUGINOSA  Urine Culture     Status: Abnormal   Collection Time:  12/09/20  9:37 PM   Specimen: Urine, Catheterized  Result Value Ref Range Status   Specimen Description   Final    URINE, CATHETERIZED Performed at [facility], [place] [address], [place], [place] [address]    Special Requests   Final    NONE Performed at [facility], [place] [address], [place], [place] [address]    Culture >=100,000 COLONIES/mL PSEUDOMONAS AERUGINOSA (A)  Final   Report Status 12/12/2020 FINAL  Final   Organism ID, Bacteria PSEUDOMONAS AERUGINOSA (A)  Final  Susceptibility   Pseudomonas aeruginosa - MIC*    CEFTAZIDIME 4 SENSITIVE Sensitive     CIPROFLOXACIN <=0.25 SENSITIVE Sensitive     GENTAMICIN <=1 SENSITIVE Sensitive     IMIPENEM 1 SENSITIVE Sensitive     PIP/TAZO 8 SENSITIVE Sensitive     CEFEPIME 2 SENSITIVE Sensitive     * >=100,000 COLONIES/mL PSEUDOMONAS AERUGINOSA  Resp Panel by RT-PCR (Flu A&B, Covid) Nasopharyngeal Swab     Status: None   Collection Time: 12/10/20 12:05 AM   Specimen: Nasopharyngeal Swab; Nasopharyngeal(NP) swabs in vial transport medium  Result Value Ref Range Status   SARS Coronavirus 2 by RT PCR NEGATIVE NEGATIVE Final    Comment: (NOTE) SARS-CoV-2 target nucleic acids are NOT DETECTED.  The SARS-CoV-2 RNA is generally detectable in upper respiratory specimens during the acute phase of infection. The lowest concentration of SARS-CoV-2 viral copies this assay can detect is 138 copies/mL. A negative result does not preclude SARS-Cov-2 infection and should not be used as the sole basis for treatment or other patient management decisions. A negative result may occur with  improper specimen collection/handling, submission of specimen other than nasopharyngeal swab, presence of viral mutation(s) within the areas targeted by this assay, and inadequate number of viral copies(<138 copies/mL). A negative result must be combined with clinical observations, patient history, and  epidemiological information. The expected result is Negative.  Fact Sheet for Patients:  EntrepreneurPulse.com.au  Fact Sheet for Healthcare Providers:  IncredibleEmployment.be  This test is no t yet approved or cleared by the [place] FDA and  has been authorized for detection and/or diagnosis of SARS-CoV-2 by FDA under an Emergency Use Authorization (EUA). This EUA will remain  in effect (meaning this test can be used) for the duration of the COVID-19 declaration under Section 564(b)(1) of the Act, 21 U.S.C.section 360bbb-3(b)(1), unless the authorization is terminated  or revoked sooner.       Influenza A by PCR NEGATIVE NEGATIVE Final   Influenza B by PCR NEGATIVE NEGATIVE Final    Comment: (NOTE) The Xpert Xpress SARS-CoV-2/FLU/RSV plus assay is intended as an aid in the diagnosis of influenza from Nasopharyngeal swab specimens and should not be used as a sole basis for treatment. Nasal washings and aspirates are unacceptable for Xpert Xpress SARS-CoV-2/FLU/RSV testing.  Fact Sheet for Patients: EntrepreneurPulse.com.au  Fact Sheet for Healthcare Providers: IncredibleEmployment.be  This test is not yet approved or cleared by the [place] FDA and has been authorized for detection and/or diagnosis of SARS-CoV-2 by FDA under an Emergency Use Authorization (EUA). This EUA will remain in effect (meaning this test can be used) for the duration of the COVID-19 declaration under Section 564(b)(1) of the Act, 21 U.S.C. section 360bbb-3(b)(1), unless the authorization is terminated or revoked.  Performed at [facility], [place] [address], [place], [place] [address]   Culture, blood (routine x 2)     Status: None (Preliminary result)   Collection Time: 12/10/20  6:20 PM   Specimen: BLOOD RIGHT HAND  Result Value Ref Range Status   Specimen Description   Final    BLOOD RIGHT  HAND Performed at [place] [address], [place], [place] [address]    Special Requests   Final    BOTTLES DRAWN AEROBIC AND ANAEROBIC Blood Culture results may not be optimal due to an excessive volume of blood received in culture bottles Performed at [place] [address], [place], [place] [address]    Culture  Final    NO GROWTH 4 DAYS Performed at [facility], [place] [address] [place], [place] [address]    Report Status PENDING  Incomplete  Culture, blood (routine x 2)     Status: None (Preliminary result)   Collection Time: 12/10/20  6:20 PM   Specimen: BLOOD RIGHT FOREARM  Result Value Ref Range Status   Specimen Description   Final    BLOOD RIGHT FOREARM Performed at [place] [address] [place], [place] [address]    Special Requests   Final    BOTTLES DRAWN AEROBIC ONLY Blood Culture adequate volume Performed at [place] [address] [place], [place] [address]    Culture   Final    NO GROWTH 4 DAYS Performed at [facility], [place] [address], [place],  [address]    Report Status PENDING  Incomplete      Radiology Studies: No results found.  Scheduled Meds:  aspirin  81 mg Oral Daily   docusate sodium  100 mg Oral BID   enoxaparin (LOVENOX) injection  40 mg Subcutaneous Q24H   feeding supplement  1 Container Oral BID BM   feeding supplement  237 mL Oral Q24H   insulin aspart  0-9 Units Subcutaneous TID WC   insulin glargine-yfgn  12 Units Subcutaneous Daily   memantine  5 mg Oral Daily   multivitamin with minerals  1 tablet Oral Daily   pantoprazole (PROTONIX) IV  40 mg Intravenous Q12H   vitamin B-12  1,000 mcg Oral Daily   Continuous Infusions:  piperacillin-tazobactam (ZOSYN)  IV 3.375 g (12/14/20 1554)   promethazine (PHENERGAN) injection (IM or IVPB) Stopped (12/10/20 1909)     LOS: 5 days   Marylu Lund, MD [facility] Pager On Amion  If 7PM-7AM, please contact night-coverage 12/14/2020, 6:22 PM

## 2020-12-14 NOTE — Progress Notes (Signed)
Student RN and instructor attempted IV access without success, primary RN updated

## 2020-12-14 NOTE — TOC Progression Note (Signed)
Transition of Care [facility]) - Progression Note    Patient Details  Name: Shyvonne Chastang MRN: [id number] Date of Birth: 1935/04/12  [facility] CM/SW Contact  Zemirah Krasinski, Juliann Pulse, RN Phone Number: 12/14/2020, 3:46 PM  Clinical Narrative:HHC all set up, rw has already been delivered to rm. PTAR forms in shadow chart-Nsg to call when ready if after 5p. No further CM needs.       Expected Discharge Plan: [place] Barriers to Discharge: Continued Medical Work up  Expected Discharge Plan and Services Expected Discharge Plan: [place]   Discharge Planning Services: CM Consult Post Acute Care Choice: [place] arrangements for the past 2 months: Single Family Home Expected Discharge Date:  (unknown)               DME Arranged: [place] rolling DME Agency: [employer] Date DME Agency Contacted: 12/11/20 Time DME Agency Contacted: 21 Representative spoke with at DME Agency: [place]: RN, PT, OT, Nurse's Aide, Social Work [employer] Agency: [place] Date [place]: 12/11/20 Time [place]: [phone] Representative spoke with at [place]: [place] ([place]) Interventions    Readmission Risk Interventions No flowsheet data found.

## 2020-12-14 NOTE — Progress Notes (Signed)
Initial Nutrition Assessment  DOCUMENTATION CODES:   Not applicable  INTERVENTION:  - will order Boost Breeze BID, each supplement provides 250 kcal and 9 grams of protein. - will order Ensure Enlive once/day, each supplement provides 350 kcal and 20 grams of protein. - will order 1 tablet multivitamin with minerals/day.    NUTRITION DIAGNOSIS:   Inadequate oral intake related to chronic illness, lethargy/confusion as evidenced by meal completion < 25%.  GOAL:   Patient will meet greater than or equal to 90% of their needs  MONITOR:   PO intake, Supplement acceptance, Labs, Weight trends  REASON FOR ASSESSMENT:   Consult Assessment of nutrition requirement/status  ASSESSMENT:   85 y.o. female with a medical history of dementia, type 2 DM, HLD, recent SCA CVA. She presented to the ED from home on 10/30 with AMS, N/V, and abdominal pain x1 day. She was found to have UTI d/t Foley and dx with sepsis. H&P note includes that patient's daughter reported patient had coffee-ground emesis at home; GI was consulted and last saw patient on 11/2.  Patient is noted to be a/o to self only. Able to talk with RN prior to visit to patient's room. She shares that patient ate a little better yesterday than today. Staff has been offering her multiple foods at meals and in between meals to include finger food-type foods and sweeter foods such as ice cream. Patient has only had a few bites today.   Patient laying in bed with no visitors present at the time of RD visit. Patient is conversant but unable to provide answers to questions asked.   Weight on 11/31 was 147 lb and weight on 11/05/20 was 160 lb. This indicates 13 lb weight loss (8% body weight) in the past 5 weeks; significant for time frame.   Suspect decreased intakes and decreased interest in food is partly due to acute illness and partly due to progressive nature of dementia.   Labs reviewed; CBGs: 87 and 84 mg/dl, K: 3 mmol/l, BUN: 7  mg/dl, GFR: 58 ml/min.  Medications reviewed; 100 mg colace BID, sliding scale novolog, 12 units semglee/day, 40 mg IV protonix BID, 60 mEq Klor-Con x1 dose 11/3, 1000 mcg oral cyanocobalamin/day.     NUTRITION - FOCUSED PHYSICAL EXAM:  Completed; no muscle or fat depletions.   Diet Order:   Diet Order             DIET DYS 2 Room service appropriate? Yes; Fluid consistency: Thin  Diet effective now                   EDUCATION NEEDS:   No education needs have been identified at this time  Skin:  Skin Assessment: Reviewed RN Assessment  Last BM:  11/3 (type 4)  Height:   Ht Readings from Last 1 Encounters:  12/10/20 5\' 2"  (1.575 m)    Weight:   Wt Readings from Last 1 Encounters:  12/10/20 66.7 kg     Estimated Nutritional Needs:  Kcal:  1300-1500 kcal Protein:  60-70 grams Fluid:  >/= 1.6 L/day      Jarome Matin, MS, RD, LDN, CNSC Inpatient Clinical Dietitian RD pager # available in AMION  After hours/weekend pager # available in [facility]

## 2020-12-14 NOTE — Consult Note (Signed)
[facility] Inpatient Consult   12/14/2020  Donnetta Gillin [birth year] [id number]  [facility] [ACO] Patient: [employer]   Patient chart has been reviewed for readmissions less than 30 days and for high risk score for unplanned readmission.  Patient assessed for community [facility] follow up needs.    Plan: Continue to follow for progression and disposition.  Of note, [facility] services does not replace or interfere with any services that are arranged by inpatient case management or social work.   Netta Cedars, MSN, RN [facility] [employer] [phone]  Toll free office [phone]

## 2020-12-14 NOTE — Patient Care Conference (Signed)
Called pt's daughter, [name], at number listed to give update. No answer. Will try again later

## 2020-12-15 DIAGNOSIS — F0390 Unspecified dementia without behavioral disturbance: Secondary | ICD-10-CM | POA: Diagnosis not present

## 2020-12-15 DIAGNOSIS — I639 Cerebral infarction, unspecified: Secondary | ICD-10-CM | POA: Diagnosis not present

## 2020-12-15 LAB — COMPREHENSIVE METABOLIC PANEL
ALT: 17 U/L (ref 0–44)
AST: 28 U/L (ref 15–41)
Albumin: 2.7 g/dL — ABNORMAL LOW (ref 3.5–5.0)
Alkaline Phosphatase: 41 U/L (ref 38–126)
Anion gap: 8 (ref 5–15)
BUN: 8 mg/dL (ref 8–23)
CO2: 26 mmol/L (ref 22–32)
Calcium: 8.9 mg/dL (ref 8.9–10.3)
Chloride: 106 mmol/L (ref 98–111)
Creatinine, Ser: 0.76 mg/dL (ref 0.44–1.00)
GFR, Estimated: >60 mL/min (ref >60)
Glucose, Bld: 76 mg/dL (ref 70–99)
Potassium: 3 mmol/L — ABNORMAL LOW (ref 3.5–5.1)
Sodium: 140 mmol/L (ref 135–145)
Total Bilirubin: 0.7 mg/dL (ref 0.3–1.2)
Total Protein: 5.5 g/dL — ABNORMAL LOW (ref 6.5–8.1)

## 2020-12-15 LAB — CULTURE, BLOOD (ROUTINE X 2)
Culture: NO GROWTH
Culture: NO GROWTH
Special Requests: ADEQUATE

## 2020-12-15 LAB — BASIC METABOLIC PANEL
Anion gap: 6 (ref 5–15)
BUN: 8 mg/dL (ref 8–23)
CO2: 23 mmol/L (ref 22–32)
Calcium: 9.1 mg/dL (ref 8.9–10.3)
Chloride: 108 mmol/L (ref 98–111)
Creatinine, Ser: 0.96 mg/dL (ref 0.44–1.00)
GFR, Estimated: 59 mL/min — ABNORMAL LOW (ref >60)
Glucose, Bld: 228 mg/dL — ABNORMAL HIGH (ref 70–99)
Potassium: 4.7 mmol/L (ref 3.5–5.1)
Sodium: 137 mmol/L (ref 135–145)

## 2020-12-15 LAB — GLUCOSE, CAPILLARY
Glucose-Capillary: 77 mg/dL (ref 70–99)
Glucose-Capillary: 87 mg/dL (ref 70–99)
Glucose-Capillary: 234 mg/dL — ABNORMAL HIGH (ref 70–99)
Glucose-Capillary: 96 mg/dL (ref 70–99)

## 2020-12-15 MED ORDER — POLYETHYLENE GLYCOL 3350 17 G PO PACK
17.0000 g | PACK | Freq: Every day | ORAL | Status: DC
  Administered 2020-12-15 – 2020-12-20 (×4): 17 g via ORAL
  Filled 2020-12-15 (×5): qty 1

## 2020-12-15 MED ORDER — POTASSIUM CHLORIDE 10 MEQ/100ML IV SOLN: 10.0000 meq | INTRAVENOUS | Status: DC

## 2020-12-15 MED ORDER — POTASSIUM CHLORIDE 10 MEQ/100ML IV SOLN
10.0000 meq | INTRAVENOUS | Status: AC
  Administered 2020-12-15 (×5): 10 meq via INTRAVENOUS
  Filled 2020-12-15 (×5): qty 100

## 2020-12-15 NOTE — Patient Care Conference (Signed)
Attempted to call pt's daughter, [name], at number listed. No answer. Will try again later.

## 2020-12-15 NOTE — Progress Notes (Signed)
PROGRESS NOTE    Stacey Fuentes  XIP:[id number] DOB: 1935/04/16 DOA: 12/09/2020 PCP: Harlan Stains, MD    Brief Narrative:  85 y.o. female with a PMH significant for dementia, type II DM, HLD, recent SCA CVA. They presented from home to the ED on 12/09/2020 with acute change in mental status associated with N/V, abdominal pain x 1 days. In the ED, it was found that they had urinary tract infection associated with Foley catheter. They were treated with empiric antibiotics, supportive care.  Patient was admitted to medicine service for further workup and management of sepsis as outlined in detail below.  Daughter stating on presentation to the ED on 12/09/2020 patient had what she describes as coffee-ground emesis and although hemoglobin has remained stable and with no further coffee-ground emesis is insistent on further evaluation by GI.  GI consulted.  Assessment & Plan:   Principal Problem:   Sepsis due to Pseudomonas [facility]) Active Problems:   Essential hypertension   Cerebellar infarct ([place])   Dementia without behavioral disturbance (HCC)   Type 2 diabetes mellitus with other specified complication (HCC)   UTI (urinary tract infection)   Coffee ground emesis   GERD (gastroesophageal reflux disease)  #1 sepsis secondary to Pseudomonas UTI with acute metabolic encephalopathy, POA -Patient on presentation noted-acute metabolic encephalopathy noted to be more confused than baseline. -COVID-19 PCR negative. -Urine cultures with Pseudomonas, found to be pan-sensitive -Blood cultures pending with no growth to date. -Was on IV Rocephin and transitioned to IV Zosyn, completed course -remains afebrile, without leukocytosis  2.  Type 2 diabetes mellitus with long-term insulin use -Hemoglobin A1c 8.3 99/28/2022) -Continue current dose of Semglee 12 units daily, SSI. -glycemic trends remains stable  3.  History of recent CVA with residual weakness -Pt is continued on aspirin for  secondary stroke prophylaxis. -Statin.  4.  Hematuria -Urinalysis with 11-20 RBCs. -Likely secondary to acute UTI. -completed on IV antibiotics.  5. Reported possible Coffee-ground emesis -Noted per daughter when patient was seen in the ED on 12/09/2020, daughter states she witnessed patient vomiting blood/brownish coffee-ground emesis. -No further hematemesis or coffee-ground emesis noted during this hospitalization thus far and pt has remained hemodynamically stable -Hgb trends reviewed, have remained stable with no evidence of blood loss. -Patient is continued on IV PPI. -Family had reportedly been insistent on further evaluation and requesting GI consultation -GI was then consulted for possible EGD per family request. Given recent CVA, case was discussed with on-call Neurologist, Dr. Rory Percy, who recommends holding off on non-emergent EGD for at least 3 months, citing increased risk for peri-procedural complications, including new strokes, if performed sooner -As such, GI recommended outpatient f/u for consideration for EGD once she is 3 months out from CVA. GI recommendation for BID PPI x 8 weeks. GI has since signed off -Had discussed with and updated family regarding above recommendations on 12/12/2020 over phone, spoke with daughter [name].  Family is in full agreement. -Recent hemoglobin trends have increased, most recently up to 11.3  6.  Hypertension -BP noted to be borderline -Discontinued Norvasc, bp stable -Avoid hypotension given recent CVA  7.  Dementia -Continue home regimen Namenda. -Suspect related to poor p.o. intake. -Appreciate Nutrition input. Significant wt loss and decreased interest in food observed likely due to both acute illness and progressive dementia  8.  GERD -Continued on PPI.  9.  Normocytic anemia -Patient with no overt bleeding during this hospitalization. -Continue to follow CBC trends -Hemoglobin has trended up to 11.3 on  most recent check -No  evidence of acute blood loss, staff reports brown-colored stools with no evidence of melena or blood  10.  Constipation -Daughter over phone reports concerns of constipation recently -Resolved.  Multiple medium size stools noted recently -Patient is now continued on daily miralax  11.  Hypomagnesemia, hypokalemia -Suspect secondary to chronic malnutrition, in setting of known dementia -Continue to replace as needed, remains low today  DVT prophylaxis: Lovenox subq Code Status: Full Family Communication: Pt in room  Status is: Inpatient  Remains inpatient appropriate because: Severity of illness   Consultants:  GI Discussed case with on-call neurologist  Procedures:    Antimicrobials: Anti-infectives (From admission, onward)    Start     Dose/Rate Route Frequency Ordered Stop   12/10/20 2200  piperacillin-tazobactam (ZOSYN) IVPB 3.375 g        3.375 g 12.5 mL/hr over 240 Minutes Intravenous Every 8 hours 12/10/20 1644 12/15/20 0133   12/10/20 1645  piperacillin-tazobactam (ZOSYN) IVPB 3.375 g        3.375 g 100 mL/hr over 30 Minutes Intravenous  Once 12/10/20 1644 12/10/20 1902   12/10/20 0000  cefTRIAXone (ROCEPHIN) 2 g in sodium chloride 0.9 % 100 mL IVPB  Status:  Discontinued        2 g 200 mL/hr over 30 Minutes Intravenous Every 24 hours 12/09/20 2354 12/10/20 1644   12/09/20 2330  cefTRIAXone (ROCEPHIN) 1 g in sodium chloride 0.9 % 100 mL IVPB  Status:  Discontinued        1 g 200 mL/hr over 30 Minutes Intravenous  Once 12/09/20 2329 12/09/20 2357       Subjective: Pleasantly demented  Objective: Vitals:   12/14/20 0549 12/14/20 1153 12/15/20 0600 12/15/20 1552  BP: (!) 142/67 102/79 122/64 135/72  Pulse: 90 87 82 (!) 105  Resp: 16 16 16 14   Temp: 98 F (36.7 C) 97.8 F (36.6 C) 97.7 F (36.5 C) 97.8 F (36.6 C)  TempSrc: Axillary Axillary Oral Oral  SpO2: 100% 100% 99% 98%  Weight:      Height:        Intake/Output Summary (Last 24 hours) at  12/15/2020 1742 Last data filed at 12/15/2020 1700 Gross per 24 hour  Intake 518.99 ml  Output --  Net 518.99 ml    Filed Weights   12/10/20 1700 12/10/20 2250  Weight: 72.6 kg 66.7 kg    Examination: General exam: Conversant, in no acute distress Respiratory system: normal chest rise, clear, no audible wheezing Cardiovascular system: regular rhythm, s1-s2 Gastrointestinal system: Nondistended, nontender, pos BS Central nervous system: No seizures, no tremors Extremities: No cyanosis, no joint deformities Skin: No rashes, no pallor Psychiatry: Affect normal // no auditory hallucinations   Data Reviewed: I have personally reviewed following labs and imaging studies  CBC: Recent Labs  Lab 12/08/20 2018 12/09/20 2030 12/10/20 0301 12/11/20 0849 12/12/20 0446 12/13/20 1255  WBC 8.7 9.1 8.8 10.1 7.1 5.5  NEUTROABS 6.9 7.1  --  7.4 4.1  --   HGB 11.6* 12.2 11.6* 11.6* 10.4* 11.3*  HCT 34.8* 37.7 35.9* 35.5* 32.9* 34.4*  MCV 95.3 96.4 96.0 94.9 97.9 94.0  PLT 207 246 250 233 201 702    Basic Metabolic Panel: Recent Labs  Lab 12/10/20 0301 12/11/20 0849 12/12/20 0446 12/13/20 1009 12/13/20 1255 12/14/20 0435 12/14/20 1822 12/15/20 0450  NA 134* 137 138 137 138 140  --  140  K 4.3 3.7 3.0* 3.1* 3.0* 3.0* 3.3* 3.0*  CL  99 103 106 105 105 106  --  106  CO2 24 24 23 26 27 24   --  26  GLUCOSE 298* 221* 121* 161* 166* 83  --  76  BUN 14 11 7* 5* 6* 7*  --  8  CREATININE 0.81 0.86 0.77 0.79 0.88 0.97  --  0.76  CALCIUM 8.6* 8.6* 8.4* 8.4* 8.5* 8.9  --  8.9  MG 1.9 1.8 1.6*  --  2.2 2.0  --   --   PHOS 3.2  --   --   --   --   --   --   --     GFR: Estimated Creatinine Clearance: 47.7 mL/min (by C-G formula based on SCr of 0.76 mg/dL). Liver Function Tests: Recent Labs  Lab 12/09/20 2030 12/13/20 1009 12/13/20 1255 12/14/20 0435 12/15/20 0450  AST 25 22 25 27 28   ALT 25 18 20 18 17   ALKPHOS 65 49 53 46 41  BILITOT 1.0 0.8 1.1 0.9 0.7  PROT 6.5 5.6* 6.3*  5.6* 5.5*  ALBUMIN 3.3* 2.9* 3.2* 2.9* 2.7*    Recent Labs  Lab 12/08/20 2018  LIPASE 20    No results for input(s): AMMONIA in the last 168 hours. Coagulation Profile: No results for input(s): INR, PROTIME in the last 168 hours. Cardiac Enzymes: No results for input(s): CKTOTAL, CKMB, CKMBINDEX, TROPONINI in the last 168 hours. BNP (last 3 results) No results for input(s): PROBNP in the last 8760 hours. HbA1C: No results for input(s): HGBA1C in the last 72 hours. CBG: Recent Labs  Lab 12/14/20 1622 12/14/20 2124 12/14/20 2202 12/15/20 0826 12/15/20 1204  GLUCAP 77 65* 87 77 87    Lipid Profile: No results for input(s): CHOL, HDL, LDLCALC, TRIG, CHOLHDL, LDLDIRECT in the last 72 hours. Thyroid Function Tests: No results for input(s): TSH, T4TOTAL, FREET4, T3FREE, THYROIDAB in the last 72 hours. Anemia Panel: No results for input(s): VITAMINB12, FOLATE, FERRITIN, TIBC, IRON, RETICCTPCT in the last 72 hours. Sepsis Labs: Recent Labs  Lab 12/09/20 2030  LATICACIDVEN 1.4     Recent Results (from the past 240 hour(s))  Urine Culture     Status: Abnormal   Collection Time: 12/08/20  7:56 PM   Specimen: Urine, Catheterized  Result Value Ref Range Status   Specimen Description URINE, CATHETERIZED  Final   Special Requests NONE  Final   Culture (A)  Final    >=100,000 COLONIES/mL PSEUDOMONAS AERUGINOSA 60,000 COLONIES/mL AEROCOCCUS URINAE Standardized susceptibility testing for this organism is not available. Performed at [facility], [place] [address] [place], [place] [address]    Report Status 12/11/2020 FINAL  Final   Organism ID, Bacteria PSEUDOMONAS AERUGINOSA (A)  Final      Susceptibility   Pseudomonas aeruginosa - MIC*    CEFTAZIDIME 4 SENSITIVE Sensitive     CIPROFLOXACIN <=0.25 SENSITIVE Sensitive     GENTAMICIN <=1 SENSITIVE Sensitive     IMIPENEM 1 SENSITIVE Sensitive     PIP/TAZO 8 SENSITIVE Sensitive     CEFEPIME 2 SENSITIVE Sensitive     *  >=100,000 COLONIES/mL PSEUDOMONAS AERUGINOSA  Urine Culture     Status: Abnormal   Collection Time: 12/09/20  9:37 PM   Specimen: Urine, Catheterized  Result Value Ref Range Status   Specimen Description   Final    URINE, CATHETERIZED Performed at [facility], [place] [address] [place], [place] [address]    Special Requests   Final    NONE Performed  at [facility], [place] [address], [place], [place] [address]    Culture >=100,000 COLONIES/mL PSEUDOMONAS AERUGINOSA (A)  Final   Report Status 12/12/2020 FINAL  Final   Organism ID, Bacteria PSEUDOMONAS AERUGINOSA (A)  Final      Susceptibility   Pseudomonas aeruginosa - MIC*    CEFTAZIDIME 4 SENSITIVE Sensitive     CIPROFLOXACIN <=0.25 SENSITIVE Sensitive     GENTAMICIN <=1 SENSITIVE Sensitive     IMIPENEM 1 SENSITIVE Sensitive     PIP/TAZO 8 SENSITIVE Sensitive     CEFEPIME 2 SENSITIVE Sensitive     * >=100,000 COLONIES/mL PSEUDOMONAS AERUGINOSA  Resp Panel by RT-PCR (Flu A&B, Covid) Nasopharyngeal Swab     Status: None   Collection Time: 12/10/20 12:05 AM   Specimen: Nasopharyngeal Swab; Nasopharyngeal(NP) swabs in vial transport medium  Result Value Ref Range Status   SARS Coronavirus 2 by RT PCR NEGATIVE NEGATIVE Final    Comment: (NOTE) SARS-CoV-2 target nucleic acids are NOT DETECTED.  The SARS-CoV-2 RNA is generally detectable in upper respiratory specimens during the acute phase of infection. The lowest concentration of SARS-CoV-2 viral copies this assay can detect is 138 copies/mL. A negative result does not preclude SARS-Cov-2 infection and should not be used as the sole basis for treatment or other patient management decisions. A negative result may occur with  improper specimen collection/handling, submission of specimen other than nasopharyngeal swab, presence of viral mutation(s) within the areas targeted by this assay, and inadequate number of viral copies(<138 copies/mL). A  negative result must be combined with clinical observations, patient history, and epidemiological information. The expected result is Negative.  Fact Sheet for Patients:  EntrepreneurPulse.com.au  Fact Sheet for Healthcare Providers:  IncredibleEmployment.be  This test is no t yet approved or cleared by the [place] FDA and  has been authorized for detection and/or diagnosis of SARS-CoV-2 by FDA under an Emergency Use Authorization (EUA). This EUA will remain  in effect (meaning this test can be used) for the duration of the COVID-19 declaration under Section 564(b)(1) of the Act, 21 U.S.C.section 360bbb-3(b)(1), unless the authorization is terminated  or revoked sooner.       Influenza A by PCR NEGATIVE NEGATIVE Final   Influenza B by PCR NEGATIVE NEGATIVE Final    Comment: (NOTE) The Xpert Xpress SARS-CoV-2/FLU/RSV plus assay is intended as an aid in the diagnosis of influenza from Nasopharyngeal swab specimens and should not be used as a sole basis for treatment. Nasal washings and aspirates are unacceptable for Xpert Xpress SARS-CoV-2/FLU/RSV testing.  Fact Sheet for Patients: EntrepreneurPulse.com.au  Fact Sheet for Healthcare Providers: IncredibleEmployment.be  This test is not yet approved or cleared by the [place] FDA and has been authorized for detection and/or diagnosis of SARS-CoV-2 by FDA under an Emergency Use Authorization (EUA). This EUA will remain in effect (meaning this test can be used) for the duration of the COVID-19 declaration under Section 564(b)(1) of the Act, 21 U.S.C. section 360bbb-3(b)(1), unless the authorization is terminated or revoked.  Performed at [facility], [place] [address] [place], [place] [address]   Culture, blood (routine x 2)     Status: None   Collection Time: 12/10/20  6:20 PM   Specimen: BLOOD RIGHT HAND  Result Value Ref  Range Status   Specimen Description   Final    BLOOD RIGHT HAND Performed at [place] [address], [place], [place] [address]    Special Requests  Final    BOTTLES DRAWN AEROBIC AND ANAEROBIC Blood Culture results may not be optimal due to an excessive volume of blood received in culture bottles Performed at [place] [address], [place], [place] [address]    Culture   Final    NO GROWTH 5 DAYS Performed at [facility], [place] [address] [place], [place] [address]    Report Status 12/15/2020 FINAL  Final  Culture, blood (routine x 2)     Status: None   Collection Time: 12/10/20  6:20 PM   Specimen: BLOOD RIGHT FOREARM  Result Value Ref Range Status   Specimen Description   Final    BLOOD RIGHT FOREARM Performed at [place] [address] [place], [place] [address]    Special Requests   Final    BOTTLES DRAWN AEROBIC ONLY Blood Culture adequate volume Performed at [place] [address] [place], [place] [address]    Culture   Final    NO GROWTH 5 DAYS Performed at [facility], [place] [address], [place], [place] [address]    Report Status 12/15/2020 FINAL  Final      Radiology Studies: No results found.  Scheduled Meds:  aspirin  81 mg Oral Daily   enoxaparin (LOVENOX) injection  40 mg Subcutaneous Q24H   feeding supplement  1 Container Oral BID BM   feeding supplement  237 mL Oral Q24H   insulin aspart  0-9 Units Subcutaneous TID WC   insulin glargine-yfgn  6 Units Subcutaneous Daily   memantine  5 mg Oral Daily   multivitamin with minerals  1 tablet Oral Daily   pantoprazole (PROTONIX) IV  40 mg Intravenous Q12H   polyethylene glycol  17 g Oral Daily   vitamin B-12  1,000 mcg Oral Daily   Continuous Infusions:  promethazine (PHENERGAN) injection (IM or IVPB) Stopped (12/10/20 1909)     LOS: 6 days   Marylu Lund, MD [facility] Pager On  Amion  If 7PM-7AM, please contact night-coverage 12/15/2020, 5:42 PM

## 2020-12-16 DIAGNOSIS — F0390 Unspecified dementia without behavioral disturbance: Secondary | ICD-10-CM | POA: Diagnosis not present

## 2020-12-16 DIAGNOSIS — I10 Essential (primary) hypertension: Secondary | ICD-10-CM | POA: Diagnosis not present

## 2020-12-16 LAB — GLUCOSE, CAPILLARY
Glucose-Capillary: 168 mg/dL — ABNORMAL HIGH (ref 70–99)
Glucose-Capillary: 133 mg/dL — ABNORMAL HIGH (ref 70–99)
Glucose-Capillary: 116 mg/dL — ABNORMAL HIGH (ref 70–99)
Glucose-Capillary: 163 mg/dL — ABNORMAL HIGH (ref 70–99)

## 2020-12-16 LAB — CBC
HCT: 34.6 % — ABNORMAL LOW (ref 36.0–46.0)
Hemoglobin: 11.2 g/dL — ABNORMAL LOW (ref 12.0–15.0)
MCH: 31.3 pg (ref 26.0–34.0)
MCHC: 32.4 g/dL (ref 30.0–36.0)
MCV: 96.6 fL (ref 80.0–100.0)
Platelets: 194 10*3/uL (ref 150–400)
RBC: 3.58 MIL/uL — ABNORMAL LOW (ref 3.87–5.11)
RDW: 12.9 % (ref 11.5–15.5)
WBC: 5.1 10*3/uL (ref 4.0–10.5)
nRBC: 0 % (ref 0.0–0.2)

## 2020-12-16 LAB — COMPREHENSIVE METABOLIC PANEL
ALT: 22 U/L (ref 0–44)
AST: 29 U/L (ref 15–41)
Albumin: 2.9 g/dL — ABNORMAL LOW (ref 3.5–5.0)
Alkaline Phosphatase: 48 U/L (ref 38–126)
Anion gap: 8 (ref 5–15)
BUN: 5 mg/dL — ABNORMAL LOW (ref 8–23)
CO2: 25 mmol/L (ref 22–32)
Calcium: 9.1 mg/dL (ref 8.9–10.3)
Chloride: 105 mmol/L (ref 98–111)
Creatinine, Ser: 0.68 mg/dL (ref 0.44–1.00)
GFR, Estimated: >60 mL/min (ref >60)
Glucose, Bld: 179 mg/dL — ABNORMAL HIGH (ref 70–99)
Potassium: 3.9 mmol/L (ref 3.5–5.1)
Sodium: 138 mmol/L (ref 135–145)
Total Bilirubin: 0.6 mg/dL (ref 0.3–1.2)
Total Protein: 5.7 g/dL — ABNORMAL LOW (ref 6.5–8.1)

## 2020-12-16 NOTE — Progress Notes (Signed)
PROGRESS NOTE    Stacey Fuentes  FKC:[id number] DOB: Jun 24, 1935 DOA: 12/09/2020 PCP: Harlan Stains, MD    Brief Narrative:  85 y.o. female with a PMH significant for dementia, type II DM, HLD, recent SCA CVA. They presented from home to the ED on 12/09/2020 with acute change in mental status associated with N/V, abdominal pain x 1 days. In the ED, it was found that they had urinary tract infection associated with Foley catheter. They were treated with empiric antibiotics, supportive care.  Patient was admitted to medicine service for further workup and management of sepsis as outlined in detail below.  Daughter stating on presentation to the ED on 12/09/2020 patient had what she describes as coffee-ground emesis and although hemoglobin has remained stable and with no further coffee-ground emesis is insistent on further evaluation by GI.  GI consulted.  Assessment & Plan:   Principal Problem:   Sepsis due to Pseudomonas [employer] Active Problems:   Essential hypertension   Cerebellar infarct ([place])   Dementia without behavioral disturbance (HCC)   Type 2 diabetes mellitus with other specified complication (HCC)   UTI (urinary tract infection)   Coffee ground emesis   GERD (gastroesophageal reflux disease)  #1 sepsis secondary to Pseudomonas UTI with acute metabolic encephalopathy, POA -Patient on presentation noted-acute metabolic encephalopathy noted to be more confused than baseline. -COVID-19 PCR negative. -Urine cultures with Pseudomonas, found to be pan-sensitive -Blood cultures pending with no growth to date. -Was on IV Rocephin and transitioned to IV Zosyn, completed course -remains afebrile, without leukocytosis, medically stable  2.  Type 2 diabetes mellitus with long-term insulin use -Hemoglobin A1c 8.3 99/28/2022) -Continue current dose of Semglee 12 units daily, SSI. -glycemic trends have remained stable  3.  History of recent CVA with residual weakness -Pt is continued  on aspirin for secondary stroke prophylaxis. -Statin.  4.  Hematuria -Urinalysis with 11-20 RBCs. -Likely secondary to acute UTI. -completed on IV antibiotics.  5. Reported possible Coffee-ground emesis -Noted per daughter when patient was seen in the ED on 12/09/2020, daughter states she witnessed patient vomiting blood/brownish coffee-ground emesis. -No further hematemesis or coffee-ground emesis noted during this hospitalization thus far and pt has remained hemodynamically stable -Hgb trends reviewed, have remained stable with no evidence of blood loss. -Patient is continued on IV PPI. -Family had reportedly been insistent on further evaluation and requesting GI consultation -GI was then consulted for possible EGD per family request. Given recent CVA, case was discussed with on-call Neurologist, Dr. Rory Percy, who recommends holding off on non-emergent EGD for at least 3 months, citing increased risk for peri-procedural complications, including new strokes, if performed sooner -As such, GI recommended outpatient f/u for consideration for EGD once she is 3 months out from CVA. GI recommendation for BID PPI x 8 weeks. GI has since signed off -Had discussed with and updated family regarding above recommendations on 12/12/2020 over phone, spoke with daughter [name].  Family is in full agreement. -Recent hemoglobin trends have increased, most recently up to 11.3  6.  Hypertension -BP noted to be borderline -Discontinued Norvasc, bp stable -Avoid hypotension given recent CVA  7.  Dementia -Continue home regimen Namenda. -Suspect related to poor p.o. intake. -Appreciate Nutrition input. Significant wt loss and decreased interest in food observed likely due to both acute illness and progressive dementia -discussed with pt's daughter over phone who has observed markedly poor PO intake prior to admit following CVA in addition to dramatic function decline. Daughter understanding of natural progression  of likely vascular dementia and is agreeable to discussion with Palliative Care for goals of care  8.  GERD -Continued on PPI.  9.  Normocytic anemia -Patient with no overt bleeding during this hospitalization. -Hemoglobin has trended up to 11.2 on most recent check -No evidence of acute blood loss, staff reports brown-colored stools with no evidence of melena or blood  10.  Constipation -Daughter over phone reports concerns of constipation recently -Resolved.  Multiple medium size stools noted recently -Patient is now continued on daily miralax  11.  Hypomagnesemia, hypokalemia -Suspect secondary to chronic malnutrition, in setting of known dementia -Now normal at 3.9, continue to replace as needed  DVT prophylaxis: Lovenox subq Code Status: Full Family Communication: Pt in room, discussed with pt's daughter over phone  Status is: Inpatient  Remains inpatient appropriate because: Severity of illness   Consultants:  GI Discussed case with on-call neurologist Palliative Care  Procedures:    Antimicrobials: Anti-infectives (From admission, onward)    Start     Dose/Rate Route Frequency Ordered Stop   12/10/20 2200  piperacillin-tazobactam (ZOSYN) IVPB 3.375 g        3.375 g 12.5 mL/hr over 240 Minutes Intravenous Every 8 hours 12/10/20 1644 12/15/20 0133   12/10/20 1645  piperacillin-tazobactam (ZOSYN) IVPB 3.375 g        3.375 g 100 mL/hr over 30 Minutes Intravenous  Once 12/10/20 1644 12/10/20 1902   12/10/20 0000  cefTRIAXone (ROCEPHIN) 2 g in sodium chloride 0.9 % 100 mL IVPB  Status:  Discontinued        2 g 200 mL/hr over 30 Minutes Intravenous Every 24 hours 12/09/20 2354 12/10/20 1644   12/09/20 2330  cefTRIAXone (ROCEPHIN) 1 g in sodium chloride 0.9 % 100 mL IVPB  Status:  Discontinued        1 g 200 mL/hr over 30 Minutes Intravenous  Once 12/09/20 2329 12/09/20 2357       Subjective: Demented this AM, unable to assess  Objective: Vitals:   12/15/20  1552 12/15/20 2002 12/16/20 0600 12/16/20 1341  BP: 135/72 140/70 133/65 (!) 141/88  Pulse: (!) 105 96 91 89  Resp: 14 20 16 20   Temp: 97.8 F (36.6 C) 97.9 F (36.6 C) 98 F (36.7 C)   TempSrc: Oral     SpO2: 98% 97% 96% 99%  Weight:      Height:        Intake/Output Summary (Last 24 hours) at 12/16/2020 1607 Last data filed at 12/16/2020 1436 Gross per 24 hour  Intake 120 ml  Output 1200 ml  Net -1080 ml    Filed Weights   12/10/20 1700 12/10/20 2250  Weight: 72.6 kg 66.7 kg    Examination: General exam: Conversant, in no acute distress Respiratory system: normal chest rise, clear, no audible wheezing Cardiovascular system: regular rhythm, s1-s2 Gastrointestinal system: Nondistended, nontender, pos BS Central nervous system: No seizures, no tremors Extremities: No cyanosis, no joint deformities Skin: No rashes, no pallor Psychiatry: Affect normal // no auditory hallucinations   Data Reviewed: I have personally reviewed following labs and imaging studies  CBC: Recent Labs  Lab 12/09/20 2030 12/10/20 0301 12/11/20 0849 12/12/20 0446 12/13/20 1255 12/16/20 0540  WBC 9.1 8.8 10.1 7.1 5.5 5.1  NEUTROABS 7.1  --  7.4 4.1  --   --   HGB 12.2 11.6* 11.6* 10.4* 11.3* 11.2*  HCT 37.7 35.9* 35.5* 32.9* 34.4* 34.6*  MCV 96.4 96.0 94.9 97.9 94.0 96.6  PLT 246  250 233 201 226 151    Basic Metabolic Panel: Recent Labs  Lab 12/10/20 0301 12/11/20 0849 12/12/20 0446 12/13/20 1009 12/13/20 1255 12/14/20 0435 12/14/20 1822 12/15/20 0450 12/15/20 1645 12/16/20 0540  NA 134* 137 138   < > 138 140  --  140 137 138  K 4.3 3.7 3.0*   < > 3.0* 3.0* 3.3* 3.0* 4.7 3.9  CL 99 103 106   < > 105 106  --  106 108 105  CO2 24 24 23    < > 27 24  --  26 23 25   GLUCOSE 298* 221* 121*   < > 166* 83  --  76 228* 179*  BUN 14 11 7*   < > 6* 7*  --  8 8 5*  CREATININE 0.81 0.86 0.77   < > 0.88 0.97  --  0.76 0.96 0.68  CALCIUM 8.6* 8.6* 8.4*   < > 8.5* 8.9  --  8.9 9.1 9.1  MG  1.9 1.8 1.6*  --  2.2 2.0  --   --   --   --   PHOS 3.2  --   --   --   --   --   --   --   --   --    < > = values in this interval not displayed.    GFR: Estimated Creatinine Clearance: 47.7 mL/min (by C-G formula based on SCr of 0.68 mg/dL). Liver Function Tests: Recent Labs  Lab 12/13/20 1009 12/13/20 1255 12/14/20 0435 12/15/20 0450 12/16/20 0540  AST 22 25 27 28 29   ALT 18 20 18 17 22   ALKPHOS 49 53 46 41 48  BILITOT 0.8 1.1 0.9 0.7 0.6  PROT 5.6* 6.3* 5.6* 5.5* 5.7*  ALBUMIN 2.9* 3.2* 2.9* 2.7* 2.9*    No results for input(s): LIPASE, AMYLASE in the last 168 hours.  No results for input(s): AMMONIA in the last 168 hours. Coagulation Profile: No results for input(s): INR, PROTIME in the last 168 hours. Cardiac Enzymes: No results for input(s): CKTOTAL, CKMB, CKMBINDEX, TROPONINI in the last 168 hours. BNP (last 3 results) No results for input(s): PROBNP in the last 8760 hours. HbA1C: No results for input(s): HGBA1C in the last 72 hours. CBG: Recent Labs  Lab 12/15/20 1204 12/15/20 1841 12/15/20 2226 12/16/20 0720 12/16/20 1209  GLUCAP 87 234* 96 168* 133*    Lipid Profile: No results for input(s): CHOL, HDL, LDLCALC, TRIG, CHOLHDL, LDLDIRECT in the last 72 hours. Thyroid Function Tests: No results for input(s): TSH, T4TOTAL, FREET4, T3FREE, THYROIDAB in the last 72 hours. Anemia Panel: No results for input(s): VITAMINB12, FOLATE, FERRITIN, TIBC, IRON, RETICCTPCT in the last 72 hours. Sepsis Labs: Recent Labs  Lab 12/09/20 2030  LATICACIDVEN 1.4     Recent Results (from the past 240 hour(s))  Urine Culture     Status: Abnormal   Collection Time: 12/08/20  7:56 PM   Specimen: Urine, Catheterized  Result Value Ref Range Status   Specimen Description URINE, CATHETERIZED  Final   Special Requests NONE  Final   Culture (A)  Final    >=100,000 COLONIES/mL PSEUDOMONAS AERUGINOSA 60,000 COLONIES/mL AEROCOCCUS URINAE Standardized susceptibility testing  for this organism is not available. Performed at [facility], [place] [address], [place], [place] [address]    Report Status 12/11/2020 FINAL  Final   Organism ID, Bacteria PSEUDOMONAS AERUGINOSA (A)  Final      Susceptibility   Pseudomonas aeruginosa - MIC*  CEFTAZIDIME 4 SENSITIVE Sensitive     CIPROFLOXACIN <=0.25 SENSITIVE Sensitive     GENTAMICIN <=1 SENSITIVE Sensitive     IMIPENEM 1 SENSITIVE Sensitive     PIP/TAZO 8 SENSITIVE Sensitive     CEFEPIME 2 SENSITIVE Sensitive     * >=100,000 COLONIES/mL PSEUDOMONAS AERUGINOSA  Urine Culture     Status: Abnormal   Collection Time: 12/09/20  9:37 PM   Specimen: Urine, Catheterized  Result Value Ref Range Status   Specimen Description   Final    URINE, CATHETERIZED Performed at [facility], [place] [address], [place], [place] [address]    Special Requests   Final    NONE Performed at [facility], [place] [address] [place], [place] [address]    Culture >=100,000 COLONIES/mL PSEUDOMONAS AERUGINOSA (A)  Final   Report Status 12/12/2020 FINAL  Final   Organism ID, Bacteria PSEUDOMONAS AERUGINOSA (A)  Final      Susceptibility   Pseudomonas aeruginosa - MIC*    CEFTAZIDIME 4 SENSITIVE Sensitive     CIPROFLOXACIN <=0.25 SENSITIVE Sensitive     GENTAMICIN <=1 SENSITIVE Sensitive     IMIPENEM 1 SENSITIVE Sensitive     PIP/TAZO 8 SENSITIVE Sensitive     CEFEPIME 2 SENSITIVE Sensitive     * >=100,000 COLONIES/mL PSEUDOMONAS AERUGINOSA  Resp Panel by RT-PCR (Flu A&B, Covid) Nasopharyngeal Swab     Status: None   Collection Time: 12/10/20 12:05 AM   Specimen: Nasopharyngeal Swab; Nasopharyngeal(NP) swabs in vial transport medium  Result Value Ref Range Status   SARS Coronavirus 2 by RT PCR NEGATIVE NEGATIVE Final    Comment: (NOTE) SARS-CoV-2 target nucleic acids are NOT DETECTED.  The SARS-CoV-2 RNA is generally detectable in upper respiratory specimens during the acute phase of infection.  The lowest concentration of SARS-CoV-2 viral copies this assay can detect is 138 copies/mL. A negative result does not preclude SARS-Cov-2 infection and should not be used as the sole basis for treatment or other patient management decisions. A negative result may occur with  improper specimen collection/handling, submission of specimen other than nasopharyngeal swab, presence of viral mutation(s) within the areas targeted by this assay, and inadequate number of viral copies(<138 copies/mL). A negative result must be combined with clinical observations, patient history, and epidemiological information. The expected result is Negative.  Fact Sheet for Patients:  EntrepreneurPulse.com.au  Fact Sheet for Healthcare Providers:  IncredibleEmployment.be  This test is no t yet approved or cleared by the [place] FDA and  has been authorized for detection and/or diagnosis of SARS-CoV-2 by FDA under an Emergency Use Authorization (EUA). This EUA will remain  in effect (meaning this test can be used) for the duration of the COVID-19 declaration under Section 564(b)(1) of the Act, 21 U.S.C.section 360bbb-3(b)(1), unless the authorization is terminated  or revoked sooner.       Influenza A by PCR NEGATIVE NEGATIVE Final   Influenza B by PCR NEGATIVE NEGATIVE Final    Comment: (NOTE) The Xpert Xpress SARS-CoV-2/FLU/RSV plus assay is intended as an aid in the diagnosis of influenza from Nasopharyngeal swab specimens and should not be used as a sole basis for treatment. Nasal washings and aspirates are unacceptable for Xpert Xpress SARS-CoV-2/FLU/RSV testing.  Fact Sheet for Patients: EntrepreneurPulse.com.au  Fact Sheet for Healthcare Providers: IncredibleEmployment.be  This test is not yet approved or cleared by the [place] FDA and has been authorized for detection and/or diagnosis of SARS-CoV-2 by FDA  under an Emergency  Use Authorization (EUA). This EUA will remain in effect (meaning this test can be used) for the duration of the COVID-19 declaration under Section 564(b)(1) of the Act, 21 U.S.C. section 360bbb-3(b)(1), unless the authorization is terminated or revoked.  Performed at [facility], [place] [address], [place], [place] [address]   Culture, blood (routine x 2)     Status: None   Collection Time: 12/10/20  6:20 PM   Specimen: BLOOD RIGHT HAND  Result Value Ref Range Status   Specimen Description   Final    BLOOD RIGHT HAND Performed at [facility] [address], [place], [place] [address]    Special Requests   Final    BOTTLES DRAWN AEROBIC AND ANAEROBIC Blood Culture results may not be optimal due to an excessive volume of blood received in culture bottles Performed at [place] [address] [place], [place] [address]    Culture   Final    NO GROWTH 5 DAYS Performed at [facility], [place] [address], [place], [place] [address]    Report Status 12/15/2020 FINAL  Final  Culture, blood (routine x 2)     Status: None   Collection Time: 12/10/20  6:20 PM   Specimen: BLOOD RIGHT FOREARM  Result Value Ref Range Status   Specimen Description   Final    BLOOD RIGHT FOREARM Performed at [facility] [address] [place], [place] [address]    Special Requests   Final    BOTTLES DRAWN AEROBIC ONLY Blood Culture adequate volume Performed at [facility] [address] [place], [place] [address]    Culture   Final    NO GROWTH 5 DAYS Performed at [facility], [place] [address] [place], [place] [address]    Report Status 12/15/2020 FINAL  Final      Radiology Studies: No results found.  Scheduled Meds:  aspirin  81 mg Oral Daily   enoxaparin (LOVENOX) injection  40 mg Subcutaneous Q24H   feeding supplement  1 Container Oral BID BM   feeding  supplement  237 mL Oral Q24H   insulin aspart  0-9 Units Subcutaneous TID WC   insulin glargine-yfgn  6 Units Subcutaneous Daily   memantine  5 mg Oral Daily   multivitamin with minerals  1 tablet Oral Daily   pantoprazole (PROTONIX) IV  40 mg Intravenous Q12H   polyethylene glycol  17 g Oral Daily   vitamin B-12  1,000 mcg Oral Daily   Continuous Infusions:  promethazine (PHENERGAN) injection (IM or IVPB) Stopped (12/10/20 1909)     LOS: 7 days   Marylu Lund, MD [facility] Pager On Amion  If 7PM-7AM, please contact night-coverage 12/16/2020, 4:07 PM

## 2020-12-17 DIAGNOSIS — F0390 Unspecified dementia without behavioral disturbance: Secondary | ICD-10-CM | POA: Diagnosis not present

## 2020-12-17 DIAGNOSIS — I639 Cerebral infarction, unspecified: Secondary | ICD-10-CM | POA: Diagnosis not present

## 2020-12-17 LAB — COMPREHENSIVE METABOLIC PANEL
ALT: 20 U/L (ref 0–44)
AST: 33 U/L (ref 15–41)
Albumin: 2.7 g/dL — ABNORMAL LOW (ref 3.5–5.0)
Alkaline Phosphatase: 43 U/L (ref 38–126)
Anion gap: 10 (ref 5–15)
BUN: 6 mg/dL — ABNORMAL LOW (ref 8–23)
CO2: 26 mmol/L (ref 22–32)
Calcium: 9 mg/dL (ref 8.9–10.3)
Chloride: 102 mmol/L (ref 98–111)
Creatinine, Ser: 0.67 mg/dL (ref 0.44–1.00)
GFR, Estimated: >60 mL/min (ref >60)
Glucose, Bld: 156 mg/dL — ABNORMAL HIGH (ref 70–99)
Potassium: 4.1 mmol/L (ref 3.5–5.1)
Sodium: 138 mmol/L (ref 135–145)
Total Bilirubin: 0.9 mg/dL (ref 0.3–1.2)
Total Protein: 5.6 g/dL — ABNORMAL LOW (ref 6.5–8.1)

## 2020-12-17 LAB — GLUCOSE, CAPILLARY
Glucose-Capillary: 190 mg/dL — ABNORMAL HIGH (ref 70–99)
Glucose-Capillary: 220 mg/dL — ABNORMAL HIGH (ref 70–99)
Glucose-Capillary: 180 mg/dL — ABNORMAL HIGH (ref 70–99)
Glucose-Capillary: 203 mg/dL — ABNORMAL HIGH (ref 70–99)

## 2020-12-17 LAB — MAGNESIUM: Magnesium: 1.6 mg/dL — ABNORMAL LOW (ref 1.7–2.4)

## 2020-12-17 MED ORDER — MAGNESIUM SULFATE 4 GM/100ML IV SOLN
4.0000 g | Freq: Once | INTRAVENOUS | Status: AC
  Administered 2020-12-17: 4 g via INTRAVENOUS
  Filled 2020-12-17: qty 100

## 2020-12-17 NOTE — Progress Notes (Signed)
PROGRESS NOTE    Stacey Fuentes  KWI:[id number] DOB: 06-Jul-1935 DOA: 12/09/2020 PCP: Harlan Stains, MD    Brief Narrative:  85 y.o. female with a PMH significant for dementia, type II DM, HLD, recent SCA CVA. They presented from home to the ED on 12/09/2020 with acute change in mental status associated with N/V, abdominal pain x 1 days. In the ED, it was found that they had urinary tract infection associated with Foley catheter. They were treated with empiric antibiotics, supportive care.  Patient was admitted to medicine service for further workup and management of sepsis as outlined in detail below.  Daughter stating on presentation to the ED on 12/09/2020 patient had what she describes as coffee-ground emesis and although hemoglobin has remained stable and with no further coffee-ground emesis is insistent on further evaluation by GI.  GI consulted.  Assessment & Plan:   Principal Problem:   Sepsis due to Pseudomonas [facility]) Active Problems:   Essential hypertension   Cerebellar infarct ([place])   Dementia without behavioral disturbance (HCC)   Type 2 diabetes mellitus with other specified complication (HCC)   UTI (urinary tract infection)   Coffee ground emesis   GERD (gastroesophageal reflux disease)  #1 sepsis secondary to Pseudomonas UTI with acute metabolic encephalopathy, POA -Patient on presentation noted-acute metabolic encephalopathy noted to be more confused than baseline. -COVID-19 PCR negative. -Urine cultures with Pseudomonas, found to be pan-sensitive -Blood cultures pending with no growth to date. -Was on IV Rocephin and transitioned to IV Zosyn, completed course -remains afebrile, without leukocytosis, currently remains medically stable  2.  Type 2 diabetes mellitus with long-term insulin use -Hemoglobin A1c 8.3 99/28/2022) -Continue current dose of Semglee 12 units daily, SSI. -glycemic trends have remained stable  3.  History of recent CVA with residual  weakness -Pt is continued on aspirin for secondary stroke prophylaxis. -Continue statin.  4.  Hematuria -Urinalysis with 11-20 RBCs. -Likely secondary to acute UTI. -completed on IV antibiotics.  5. Reported possible Coffee-ground emesis -Noted per daughter when patient was seen in the ED on 12/09/2020, daughter states she witnessed patient vomiting blood/brownish coffee-ground emesis. -No further hematemesis or coffee-ground emesis noted during this hospitalization thus far and pt has remained hemodynamically stable -Hgb trends reviewed, have remained stable with no evidence of blood loss. -Patient is continued on IV PPI. -Family had reportedly been insistent on further evaluation and requesting GI consultation -GI was then consulted for possible EGD per family request. Given recent CVA, case was discussed with on-call Neurologist, Dr. Rory Percy, who recommends holding off on non-emergent EGD for at least 3 months, citing increased risk for peri-procedural complications, including new strokes, if performed sooner -As such, GI recommended outpatient f/u for consideration for EGD once she is 3 months out from CVA. GI recommendation for BID PPI x 8 weeks. GI has since signed off -Had discussed with and updated family regarding above recommendations on 12/12/2020 over phone, spoke with daughter [name].  Family is in full agreement. -Recent hemoglobin trends have increased, most recently up to 11.3  6.  Hypertension -BP noted to be borderline -Discontinued Norvasc, bp stable -Avoid hypotension given recent CVA  7.  Dementia -Continue home regimen Namenda. -Suspect related to poor p.o. intake. -Appreciate Nutrition input. Significant wt loss and decreased interest in food observed likely due to both acute illness and progressive dementia -Recently discussed with pt's daughter over phone who has observed markedly poor PO intake even prior to admit following CVA. Also noted to have a dramatic  function decline. Daughter understanding of natural progression of likely vascular dementia and is agreeable to discussion with Palliative Care for goals of care -Palliative Care has been consulted  8.  GERD -Continued on PPI.  9.  Normocytic anemia -Patient with no overt bleeding during this hospitalization. -Hemoglobin has trended up to 11.2 on most recent check -No evidence of acute blood loss, staff reports brown-colored stools with no evidence of melena or blood  10.  Constipation -Daughter over phone reports concerns of constipation recently -Resolved.  Multiple medium size stools noted recently -Patient is now continued on daily miralax, continue with cathartics as needed  11.  Hypomagnesemia, hypokalemia -Suspect secondary to chronic malnutrition, in setting of known dementia -Now normal at 4.1, continue to replace as needed -Mg low at 1.6, will replace  DVT prophylaxis: Lovenox subq Code Status: Full Family Communication: Pt in room, discussed with pt's daughter over phone  Status is: Inpatient  Remains inpatient appropriate because: Severity of illness   Consultants:  GI Discussed case with on-call neurologist Palliative Care  Procedures:    Antimicrobials: Anti-infectives (From admission, onward)    Start     Dose/Rate Route Frequency Ordered Stop   12/10/20 2200  piperacillin-tazobactam (ZOSYN) IVPB 3.375 g        3.375 g 12.5 mL/hr over 240 Minutes Intravenous Every 8 hours 12/10/20 1644 12/15/20 0133   12/10/20 1645  piperacillin-tazobactam (ZOSYN) IVPB 3.375 g        3.375 g 100 mL/hr over 30 Minutes Intravenous  Once 12/10/20 1644 12/10/20 1902   12/10/20 0000  cefTRIAXone (ROCEPHIN) 2 g in sodium chloride 0.9 % 100 mL IVPB  Status:  Discontinued        2 g 200 mL/hr over 30 Minutes Intravenous Every 24 hours 12/09/20 2354 12/10/20 1644   12/09/20 2330  cefTRIAXone (ROCEPHIN) 1 g in sodium chloride 0.9 % 100 mL IVPB  Status:  Discontinued        1  g 200 mL/hr over 30 Minutes Intravenous  Once 12/09/20 2329 12/09/20 2357       Subjective: Unable to assess given mentation  Objective: Vitals:   12/16/20 0600 12/16/20 1341 12/17/20 0742 12/17/20 1117  BP: 133/65 (!) 141/88 115/76 (!) 116/51  Pulse: 91 89 (!) 102 96  Resp: 16 20 20 16   Temp: 98 F (36.7 C)  97.8 F (36.6 C) 97.9 F (36.6 C)  TempSrc:   Oral Oral  SpO2: 96% 99% 100% 97%  Weight:      Height:        Intake/Output Summary (Last 24 hours) at 12/17/2020 1544 Last data filed at 12/17/2020 1002 Gross per 24 hour  Intake 450 ml  Output --  Net 450 ml    Filed Weights   12/10/20 1700 12/10/20 2250  Weight: 72.6 kg 66.7 kg    Examination: General exam: Awake, laying in bed, in nad Respiratory system: Normal respiratory effort, no wheezing Cardiovascular system: regular rate, s1, s2 Gastrointestinal system: Soft, nondistended, positive BS Central nervous system: CN2-12 grossly intact, strength intact Extremities: Perfused, no clubbing Skin: Normal skin turgor, no notable skin lesions seen Psychiatry: difficult to assess given mentation  Data Reviewed: I have personally reviewed following labs and imaging studies  CBC: Recent Labs  Lab 12/11/20 0849 12/12/20 0446 12/13/20 1255 12/16/20 0540  WBC 10.1 7.1 5.5 5.1  NEUTROABS 7.4 4.1  --   --   HGB 11.6* 10.4* 11.3* 11.2*  HCT 35.5* 32.9* 34.4* 34.6*  MCV  94.9 97.9 94.0 96.6  PLT 233 201 226 353    Basic Metabolic Panel: Recent Labs  Lab 12/11/20 0849 12/12/20 0446 12/13/20 1009 12/13/20 1255 12/14/20 0435 12/14/20 1822 12/15/20 0450 12/15/20 1645 12/16/20 0540 12/17/20 0421  NA 137 138   < > 138 140  --  140 137 138 138  K 3.7 3.0*   < > 3.0* 3.0* 3.3* 3.0* 4.7 3.9 4.1  CL 103 106   < > 105 106  --  106 108 105 102  CO2 24 23   < > 27 24  --  26 23 25 26   GLUCOSE 221* 121*   < > 166* 83  --  76 228* 179* 156*  BUN 11 7*   < > 6* 7*  --  8 8 5* 6*  CREATININE 0.86 0.77   < > 0.88  0.97  --  0.76 0.96 0.68 0.67  CALCIUM 8.6* 8.4*   < > 8.5* 8.9  --  8.9 9.1 9.1 9.0  MG 1.8 1.6*  --  2.2 2.0  --   --   --   --  1.6*   < > = values in this interval not displayed.    GFR: Estimated Creatinine Clearance: 47.7 mL/min (by C-G formula based on SCr of 0.67 mg/dL). Liver Function Tests: Recent Labs  Lab 12/13/20 1255 12/14/20 0435 12/15/20 0450 12/16/20 0540 12/17/20 0421  AST 25 27 28 29  33  ALT 20 18 17 22 20   ALKPHOS 53 46 41 48 43  BILITOT 1.1 0.9 0.7 0.6 0.9  PROT 6.3* 5.6* 5.5* 5.7* 5.6*  ALBUMIN 3.2* 2.9* 2.7* 2.9* 2.7*    No results for input(s): LIPASE, AMYLASE in the last 168 hours.  No results for input(s): AMMONIA in the last 168 hours. Coagulation Profile: No results for input(s): INR, PROTIME in the last 168 hours. Cardiac Enzymes: No results for input(s): CKTOTAL, CKMB, CKMBINDEX, TROPONINI in the last 168 hours. BNP (last 3 results) No results for input(s): PROBNP in the last 8760 hours. HbA1C: No results for input(s): HGBA1C in the last 72 hours. CBG: Recent Labs  Lab 12/16/20 1209 12/16/20 1634 12/16/20 2129 12/17/20 0745 12/17/20 1114  GLUCAP 133* 116* 163* 190* 220*    Lipid Profile: No results for input(s): CHOL, HDL, LDLCALC, TRIG, CHOLHDL, LDLDIRECT in the last 72 hours. Thyroid Function Tests: No results for input(s): TSH, T4TOTAL, FREET4, T3FREE, THYROIDAB in the last 72 hours. Anemia Panel: No results for input(s): VITAMINB12, FOLATE, FERRITIN, TIBC, IRON, RETICCTPCT in the last 72 hours. Sepsis Labs: No results for input(s): PROCALCITON, LATICACIDVEN in the last 168 hours.   Recent Results (from the past 240 hour(s))  Urine Culture     Status: Abnormal   Collection Time: 12/08/20  7:56 PM   Specimen: Urine, Catheterized  Result Value Ref Range Status   Specimen Description URINE, CATHETERIZED  Final   Special Requests NONE  Final   Culture (A)  Final    >=100,000 COLONIES/mL PSEUDOMONAS AERUGINOSA 60,000  COLONIES/mL AEROCOCCUS URINAE Standardized susceptibility testing for this organism is not available. Performed at [facility], [place] [address], [place], [place] [address]    Report Status 12/11/2020 FINAL  Final   Organism ID, Bacteria PSEUDOMONAS AERUGINOSA (A)  Final      Susceptibility   Pseudomonas aeruginosa - MIC*    CEFTAZIDIME 4 SENSITIVE Sensitive     CIPROFLOXACIN <=0.25 SENSITIVE Sensitive     GENTAMICIN <=1 SENSITIVE Sensitive  IMIPENEM 1 SENSITIVE Sensitive     PIP/TAZO 8 SENSITIVE Sensitive     CEFEPIME 2 SENSITIVE Sensitive     * >=100,000 COLONIES/mL PSEUDOMONAS AERUGINOSA  Urine Culture     Status: Abnormal   Collection Time: 12/09/20  9:37 PM   Specimen: Urine, Catheterized  Result Value Ref Range Status   Specimen Description   Final    URINE, CATHETERIZED Performed at [facility], [place] [address], [place], [place] [address]    Special Requests   Final    NONE Performed at [facility], [place] [address] [place], [place] [address]    Culture >=100,000 COLONIES/mL PSEUDOMONAS AERUGINOSA (A)  Final   Report Status 12/12/2020 FINAL  Final   Organism ID, Bacteria PSEUDOMONAS AERUGINOSA (A)  Final      Susceptibility   Pseudomonas aeruginosa - MIC*    CEFTAZIDIME 4 SENSITIVE Sensitive     CIPROFLOXACIN <=0.25 SENSITIVE Sensitive     GENTAMICIN <=1 SENSITIVE Sensitive     IMIPENEM 1 SENSITIVE Sensitive     PIP/TAZO 8 SENSITIVE Sensitive     CEFEPIME 2 SENSITIVE Sensitive     * >=100,000 COLONIES/mL PSEUDOMONAS AERUGINOSA  Resp Panel by RT-PCR (Flu A&B, Covid) Nasopharyngeal Swab     Status: None   Collection Time: 12/10/20 12:05 AM   Specimen: Nasopharyngeal Swab; Nasopharyngeal(NP) swabs in vial transport medium  Result Value Ref Range Status   SARS Coronavirus 2 by RT PCR NEGATIVE NEGATIVE Final    Comment: (NOTE) SARS-CoV-2 target nucleic acids are NOT DETECTED.  The SARS-CoV-2 RNA is generally detectable in  upper respiratory specimens during the acute phase of infection. The lowest concentration of SARS-CoV-2 viral copies this assay can detect is 138 copies/mL. A negative result does not preclude SARS-Cov-2 infection and should not be used as the sole basis for treatment or other patient management decisions. A negative result may occur with  improper specimen collection/handling, submission of specimen other than nasopharyngeal swab, presence of viral mutation(s) within the areas targeted by this assay, and inadequate number of viral copies(<138 copies/mL). A negative result must be combined with clinical observations, patient history, and epidemiological information. The expected result is Negative.  Fact Sheet for Patients:  EntrepreneurPulse.com.au  Fact Sheet for Healthcare Providers:  IncredibleEmployment.be  This test is no t yet approved or cleared by the [place] FDA and  has been authorized for detection and/or diagnosis of SARS-CoV-2 by FDA under an Emergency Use Authorization (EUA). This EUA will remain  in effect (meaning this test can be used) for the duration of the COVID-19 declaration under Section 564(b)(1) of the Act, 21 U.S.C.section 360bbb-3(b)(1), unless the authorization is terminated  or revoked sooner.       Influenza A by PCR NEGATIVE NEGATIVE Final   Influenza B by PCR NEGATIVE NEGATIVE Final    Comment: (NOTE) The Xpert Xpress SARS-CoV-2/FLU/RSV plus assay is intended as an aid in the diagnosis of influenza from Nasopharyngeal swab specimens and should not be used as a sole basis for treatment. Nasal washings and aspirates are unacceptable for Xpert Xpress SARS-CoV-2/FLU/RSV testing.  Fact Sheet for Patients: EntrepreneurPulse.com.au  Fact Sheet for Healthcare Providers: IncredibleEmployment.be  This test is not yet approved or cleared by the [place] FDA and has been  authorized for detection and/or diagnosis of SARS-CoV-2 by FDA under an Emergency Use Authorization (EUA). This EUA will remain in effect (meaning this test can be used) for the duration of the COVID-19 declaration under Section  564(b)(1) of the Act, 21 U.S.C. section 360bbb-3(b)(1), unless the authorization is terminated or revoked.  Performed at [facility], [place] [address] [place], [place] [address]   Culture, blood (routine x 2)     Status: None   Collection Time: 12/10/20  6:20 PM   Specimen: BLOOD RIGHT HAND  Result Value Ref Range Status   Specimen Description   Final    BLOOD RIGHT HAND Performed at [place] [address] [place], [place] [address]    Special Requests   Final    BOTTLES DRAWN AEROBIC AND ANAEROBIC Blood Culture results may not be optimal due to an excessive volume of blood received in culture bottles Performed at [place] [address], [place], [place] [address]    Culture   Final    NO GROWTH 5 DAYS Performed at [facility], [place] [address], [place], [place] [address]    Report Status 12/15/2020 FINAL  Final  Culture, blood (routine x 2)     Status: None   Collection Time: 12/10/20  6:20 PM   Specimen: BLOOD RIGHT FOREARM  Result Value Ref Range Status   Specimen Description   Final    BLOOD RIGHT FOREARM Performed at [place] [address], [place], [place] [address]    Special Requests   Final    BOTTLES DRAWN AEROBIC ONLY Blood Culture adequate volume Performed at [place] [address] [place], [place] [address]    Culture   Final    NO GROWTH 5 DAYS Performed at [facility], [place] [address] [place], [place] [address]    Report Status 12/15/2020 FINAL  Final      Radiology Studies: No results found.  Scheduled Meds:  aspirin  81 mg Oral Daily   enoxaparin (LOVENOX) injection  40 mg Subcutaneous Q24H    feeding supplement  1 Container Oral BID BM   feeding supplement  237 mL Oral Q24H   insulin aspart  0-9 Units Subcutaneous TID WC   insulin glargine-yfgn  6 Units Subcutaneous Daily   memantine  5 mg Oral Daily   multivitamin with minerals  1 tablet Oral Daily   pantoprazole (PROTONIX) IV  40 mg Intravenous Q12H   polyethylene glycol  17 g Oral Daily   vitamin B-12  1,000 mcg Oral Daily   Continuous Infusions:  promethazine (PHENERGAN) injection (IM or IVPB) Stopped (12/10/20 1909)     LOS: 8 days   Marylu Lund, MD [facility] Pager On Amion  If 7PM-7AM, please contact night-coverage 12/17/2020, 3:44 PM

## 2020-12-17 NOTE — TOC Progression Note (Signed)
Transition of Care [facility]) - Progression Note    Patient Details  Name: Nadean Montanaro MRN: [id number] Date of Birth: 1935-04-03  Transition of Care [facility]) CM/SW Contact  Shenica Holzheimer, Juliann Pulse, RN Phone Number: 12/17/2020, 11:47 AM  Clinical Narrative: Noted for palliative care meeting-GOC. Await recc.      Expected Discharge Plan: [place] Barriers to Discharge: Continued Medical Work up  Expected Discharge Plan and Services Expected Discharge Plan: [place]   Discharge Planning Services: CM Consult Post Acute Care Choice: [place] arrangements for the past 2 months: Single Family Home Expected Discharge Date:  (unknown)               DME Arranged: [place] rolling DME Agency: [employer] Date DME Agency Contacted: 12/11/20 Time DME Agency Contacted: 77 Representative spoke with at DME Agency: [place]: RN, PT, OT, Nurse's Aide, Social Work [employer] Agency: [facility] [place] Date [place]: 12/11/20 Time [place]: 1356 Representative spoke with at [place]: [place] ([place]) Interventions    Readmission Risk Interventions No flowsheet data found.

## 2020-12-17 NOTE — Progress Notes (Signed)
PT Cancellation Note  Patient Details Name: Stacey Fuentes MRN: [id number] DOB: 05-13-1935   Cancelled Treatment:    Reason Eval/Treat Not Completed: Fatigue/lethargy limiting ability to participate, unable to arouse patient. She would grunt when strongly spoken to and shaken but did not arouse. Will check back another time.   Claretha Cooper 12/17/2020, 3:31 PM [place] Pager [phone] Office [phone]

## 2020-12-18 DIAGNOSIS — I639 Cerebral infarction, unspecified: Secondary | ICD-10-CM | POA: Diagnosis not present

## 2020-12-18 DIAGNOSIS — F0390 Unspecified dementia without behavioral disturbance: Secondary | ICD-10-CM | POA: Diagnosis not present

## 2020-12-18 LAB — COMPREHENSIVE METABOLIC PANEL
ALT: 26 U/L (ref 0–44)
AST: 29 U/L (ref 15–41)
Albumin: 2.8 g/dL — ABNORMAL LOW (ref 3.5–5.0)
Alkaline Phosphatase: 48 U/L (ref 38–126)
Anion gap: 7 (ref 5–15)
BUN: 7 mg/dL — ABNORMAL LOW (ref 8–23)
CO2: 28 mmol/L (ref 22–32)
Calcium: 9.3 mg/dL (ref 8.9–10.3)
Chloride: 101 mmol/L (ref 98–111)
Creatinine, Ser: 0.77 mg/dL (ref 0.44–1.00)
GFR, Estimated: >60 mL/min (ref >60)
Glucose, Bld: 251 mg/dL — ABNORMAL HIGH (ref 70–99)
Potassium: 3.4 mmol/L — ABNORMAL LOW (ref 3.5–5.1)
Sodium: 136 mmol/L (ref 135–145)
Total Bilirubin: 0.3 mg/dL (ref 0.3–1.2)
Total Protein: 5.7 g/dL — ABNORMAL LOW (ref 6.5–8.1)

## 2020-12-18 LAB — GLUCOSE, CAPILLARY
Glucose-Capillary: 205 mg/dL — ABNORMAL HIGH (ref 70–99)
Glucose-Capillary: 284 mg/dL — ABNORMAL HIGH (ref 70–99)
Glucose-Capillary: 127 mg/dL — ABNORMAL HIGH (ref 70–99)
Glucose-Capillary: 158 mg/dL — ABNORMAL HIGH (ref 70–99)

## 2020-12-18 LAB — MAGNESIUM: Magnesium: 2.2 mg/dL (ref 1.7–2.4)

## 2020-12-18 MED ORDER — PANTOPRAZOLE SODIUM 40 MG PO TBEC
40.0000 mg | DELAYED_RELEASE_TABLET | Freq: Two times a day (BID) | ORAL | Status: DC
  Administered 2020-12-19 – 2020-12-20 (×3): 40 mg via ORAL
  Filled 2020-12-18 (×4): qty 1

## 2020-12-18 MED ORDER — INSULIN GLARGINE-YFGN 100 UNIT/ML ~~LOC~~ SOLN
10.0000 [IU] | Freq: Every day | SUBCUTANEOUS | Status: DC
  Administered 2020-12-19 – 2020-12-20 (×2): 10 [IU] via SUBCUTANEOUS
  Filled 2020-12-18 (×2): qty 0.1

## 2020-12-18 MED ORDER — POTASSIUM CHLORIDE CRYS ER 20 MEQ PO TBCR
60.0000 meq | EXTENDED_RELEASE_TABLET | Freq: Once | ORAL | Status: AC
  Administered 2020-12-18: 60 meq via ORAL
  Filled 2020-12-18: qty 3

## 2020-12-18 NOTE — Care Management Important Message (Signed)
Important Message  Patient Details IM Letter placed in Patients room for daughter [name]. Name: Stacey Fuentes MRN: [id number] Date of Birth: 30-Nov-1935   Medicare Important Message Given:  Yes     Kerin Salen 12/18/2020, 10:24 AM

## 2020-12-18 NOTE — Progress Notes (Signed)
Patient ate 100% of dinner. Patient pleasant and follows commands at this time. No change from prior nurses assessment. Will continue to monitor.

## 2020-12-18 NOTE — Progress Notes (Signed)
PROGRESS NOTE    Stacey Fuentes  QPR:[id number] DOB: [birth year] DOA: 12/09/2020 PCP: Harlan Stains, MD    Brief Narrative:  85 y.o. female with a PMH significant for dementia, type II DM, HLD, recent SCA CVA. They presented from home to the ED on 12/09/2020 with acute change in mental status associated with N/V, abdominal pain x 1 days. In the ED, it was found that they had urinary tract infection associated with Foley catheter. They were treated with empiric antibiotics, supportive care.  Patient was admitted to medicine service for further workup and management of sepsis as outlined in detail below.  Daughter stating on presentation to the ED on 12/09/2020 patient had what she describes as coffee-ground emesis and although hemoglobin has remained stable and with no further coffee-ground emesis is insistent on further evaluation by GI.  GI consulted.  Assessment & Plan:   Principal Problem:   Sepsis due to Pseudomonas [facility]) Active Problems:   Essential hypertension   Cerebellar infarct ([place])   Dementia without behavioral disturbance (HCC)   Type 2 diabetes mellitus with other specified complication (HCC)   UTI (urinary tract infection)   Coffee ground emesis   GERD (gastroesophageal reflux disease)  #1 sepsis secondary to Pseudomonas UTI with acute metabolic encephalopathy, POA -Patient on presentation noted-acute metabolic encephalopathy noted to be more confused than baseline. -COVID-19 PCR negative. -Urine cultures with Pseudomonas, found to be pan-sensitive -Blood cultures pending with no growth to date. -Completed course of broad spectrum abx -remains afebrile, without leukocytosis, currently remains medically stable  2.  Type 2 diabetes mellitus with long-term insulin use -Hemoglobin A1c 8.3 99/28/2022) -currently on Semglee 6 units daily, SSI. -glucose in the 200's. Will increase long-acting insulin to 10 units daily  3.  History of recent CVA with residual  weakness -Pt is continued on aspirin for secondary stroke prophylaxis. -Continue statin.  4.  Hematuria -Urinalysis with 11-20 RBCs. -Likely secondary to acute UTI. -completed on IV antibiotics.  5. Reported possible Coffee-ground emesis -Noted per daughter when patient was seen in the ED on 12/09/2020, daughter states she witnessed patient vomiting blood/brownish coffee-ground emesis. -No further hematemesis or coffee-ground emesis noted during this hospitalization thus far and pt has remained hemodynamically stable -Hgb trends reviewed, have remained stable with no evidence of blood loss. -Patient is continued on IV PPI. Will transition to PO protonix -Family had reportedly been insistent on further evaluation and requesting GI consultation -GI was then consulted for possible EGD per family request. Given recent CVA, case was discussed with on-call Neurologist, Dr. Rory Percy, who recommends holding off on non-emergent EGD for at least 3 months, citing increased risk for peri-procedural complications, including new strokes, if performed sooner -As such, GI recommended outpatient f/u for consideration for EGD once she is 3 months out from CVA. GI recommendation for BID PPI x 8 weeks. GI has since signed off -Had discussed with and updated family regarding above recommendations on 12/12/2020 over phone, spoke with daughter [name].  Family is in full agreement. -Recent hemoglobin trends have increased, most recently up to 11.3  6.  Hypertension -BP noted to be borderline -Discontinued Norvasc, bp stable -Avoid hypotension given recent CVA  7.  Dementia -Continue home regimen Namenda. -Suspect related to poor p.o. intake. -Appreciate Nutrition input. Significant wt loss and decreased interest in food observed likely due to both acute illness and progressive dementia -Recently discussed with pt's daughter over phone who has observed markedly poor PO intake even prior to admit following CVA. Also  noted to have a dramatic function decline. Daughter understanding of natural progression of likely vascular dementia and is agreeable to discussion with Palliative Care for goals of care -Palliative Care has been consulted, pending  8.  GERD -Continued on PPI.  9.  Normocytic anemia -Patient with no overt bleeding during this hospitalization. -Hemoglobin has trended up to 11.2 on most recent check -Without evidence of acute blood loss, staff reports brown-colored stools with no evidence of melena or blood -GI recs for 8 weeks of BID PPI per above  10.  Constipation -Daughter over phone reports concerns of constipation recently -Resolved.  Multiple medium size stools noted recently -Patient is now continued on daily miralax, continue with cathartics as needed  11.  Hypomagnesemia, hypokalemia -Suspect secondary to chronic malnutrition, in setting of known dementia -K 3.4, will correct -Mg corrected -Continue to follow lytes and replace as needed  DVT prophylaxis: Lovenox subq Code Status: Full Family Communication: Pt in room, recently discussed with pt's daughter over phone  Status is: Inpatient  Remains inpatient appropriate because: Severity of illness   Consultants:  GI Discussed case with on-call neurologist Palliative Care  Procedures:    Antimicrobials: Anti-infectives (From admission, onward)    Start     Dose/Rate Route Frequency Ordered Stop   12/10/20 2200  piperacillin-tazobactam (ZOSYN) IVPB 3.375 g        3.375 g 12.5 mL/hr over 240 Minutes Intravenous Every 8 hours 12/10/20 1644 12/15/20 0133   12/10/20 1645  piperacillin-tazobactam (ZOSYN) IVPB 3.375 g        3.375 g 100 mL/hr over 30 Minutes Intravenous  Once 12/10/20 1644 12/10/20 1902   12/10/20 0000  cefTRIAXone (ROCEPHIN) 2 g in sodium chloride 0.9 % 100 mL IVPB  Status:  Discontinued        2 g 200 mL/hr over 30 Minutes Intravenous Every 24 hours 12/09/20 2354 12/10/20 1644   12/09/20 2330   cefTRIAXone (ROCEPHIN) 1 g in sodium chloride 0.9 % 100 mL IVPB  Status:  Discontinued        1 g 200 mL/hr over 30 Minutes Intravenous  Once 12/09/20 2329 12/09/20 2357       Subjective: Cannot assess given mentation. Pt awake eating  Objective: Vitals:   12/17/20 2057 12/18/20 0442 12/18/20 0800 12/18/20 1230  BP:  (!) 115/57 111/79 111/63  Pulse:  89 86 (!) 108  Resp: 16 16 16 20   Temp:  98 F (36.7 C) 97.7 F (36.5 C) 97.6 F (36.4 C)  TempSrc: Oral  Axillary Oral  SpO2:  100% 99% 98%  Weight:      Height:        Intake/Output Summary (Last 24 hours) at 12/18/2020 1403 Last data filed at 12/18/2020 1029 Gross per 24 hour  Intake 180 ml  Output 1 ml  Net 179 ml    Filed Weights   12/10/20 1700 12/10/20 2250  Weight: 72.6 kg 66.7 kg    Examination: General exam: Not very conversant, in no acute distress Respiratory system: normal chest rise, clear, no audible wheezing Cardiovascular system: regular rhythm, s1-s2 Gastrointestinal system: Nondistended, nontender, pos BS Central nervous system: No seizures, no tremors Extremities: No cyanosis, no joint deformities Skin: No rashes, no pallor Psychiatry: Difficult to assess given mentation  Data Reviewed: I have personally reviewed following labs and imaging studies  CBC: Recent Labs  Lab 12/12/20 0446 12/13/20 1255 12/16/20 0540  WBC 7.1 5.5 5.1  NEUTROABS 4.1  --   --  HGB 10.4* 11.3* 11.2*  HCT 32.9* 34.4* 34.6*  MCV 97.9 94.0 96.6  PLT 201 226 332    Basic Metabolic Panel: Recent Labs  Lab 12/12/20 0446 12/13/20 1009 12/13/20 1255 12/14/20 0435 12/14/20 1822 12/15/20 0450 12/15/20 1645 12/16/20 0540 12/17/20 0421 12/18/20 0440  NA 138   < > 138 140  --  140 137 138 138 136  K 3.0*   < > 3.0* 3.0*   < > 3.0* 4.7 3.9 4.1 3.4*  CL 106   < > 105 106  --  106 108 105 102 101  CO2 23   < > 27 24  --  26 23 25 26 28   GLUCOSE 121*   < > 166* 83  --  76 228* 179* 156* 251*  BUN 7*   < > 6* 7*   --  8 8 5* 6* 7*  CREATININE 0.77   < > 0.88 0.97  --  0.76 0.96 0.68 0.67 0.77  CALCIUM 8.4*   < > 8.5* 8.9  --  8.9 9.1 9.1 9.0 9.3  MG 1.6*  --  2.2 2.0  --   --   --   --  1.6* 2.2   < > = values in this interval not displayed.    GFR: Estimated Creatinine Clearance: 47.7 mL/min (by C-G formula based on SCr of 0.77 mg/dL). Liver Function Tests: Recent Labs  Lab 12/14/20 0435 12/15/20 0450 12/16/20 0540 12/17/20 0421 12/18/20 0440  AST 27 28 29  33 29  ALT 18 17 22 20 26   ALKPHOS 46 41 48 43 48  BILITOT 0.9 0.7 0.6 0.9 0.3  PROT 5.6* 5.5* 5.7* 5.6* 5.7*  ALBUMIN 2.9* 2.7* 2.9* 2.7* 2.8*    No results for input(s): LIPASE, AMYLASE in the last 168 hours.  No results for input(s): AMMONIA in the last 168 hours. Coagulation Profile: No results for input(s): INR, PROTIME in the last 168 hours. Cardiac Enzymes: No results for input(s): CKTOTAL, CKMB, CKMBINDEX, TROPONINI in the last 168 hours. BNP (last 3 results) No results for input(s): PROBNP in the last 8760 hours. HbA1C: No results for input(s): HGBA1C in the last 72 hours. CBG: Recent Labs  Lab 12/17/20 1114 12/17/20 1609 12/17/20 2206 12/18/20 0734 12/18/20 1124  GLUCAP 220* 180* 203* 205* 284*    Lipid Profile: No results for input(s): CHOL, HDL, LDLCALC, TRIG, CHOLHDL, LDLDIRECT in the last 72 hours. Thyroid Function Tests: No results for input(s): TSH, T4TOTAL, FREET4, T3FREE, THYROIDAB in the last 72 hours. Anemia Panel: No results for input(s): VITAMINB12, FOLATE, FERRITIN, TIBC, IRON, RETICCTPCT in the last 72 hours. Sepsis Labs: No results for input(s): PROCALCITON, LATICACIDVEN in the last 168 hours.   Recent Results (from the past 240 hour(s))  Urine Culture     Status: Abnormal   Collection Time: 12/08/20  7:56 PM   Specimen: Urine, Catheterized  Result Value Ref Range Status   Specimen Description URINE, CATHETERIZED  Final   Special Requests NONE  Final   Culture (A)  Final    >=100,000  COLONIES/mL PSEUDOMONAS AERUGINOSA 60,000 COLONIES/mL AEROCOCCUS URINAE Standardized susceptibility testing for this organism is not available. Performed at [facility], [place] [address], [place], [place] [address]    Report Status 12/11/2020 FINAL  Final   Organism ID, Bacteria PSEUDOMONAS AERUGINOSA (A)  Final      Susceptibility   Pseudomonas aeruginosa - MIC*    CEFTAZIDIME 4 SENSITIVE Sensitive     CIPROFLOXACIN <=0.25  SENSITIVE Sensitive     GENTAMICIN <=1 SENSITIVE Sensitive     IMIPENEM 1 SENSITIVE Sensitive     PIP/TAZO 8 SENSITIVE Sensitive     CEFEPIME 2 SENSITIVE Sensitive     * >=100,000 COLONIES/mL PSEUDOMONAS AERUGINOSA  Urine Culture     Status: Abnormal   Collection Time: 12/09/20  9:37 PM   Specimen: Urine, Catheterized  Result Value Ref Range Status   Specimen Description   Final    URINE, CATHETERIZED Performed at [facility], [place] [address] [place], [place] [address]    Special Requests   Final    NONE Performed at [facility], [place] [address], [place], [place] [address]    Culture >=100,000 COLONIES/mL PSEUDOMONAS AERUGINOSA (A)  Final   Report Status 12/12/2020 FINAL  Final   Organism ID, Bacteria PSEUDOMONAS AERUGINOSA (A)  Final      Susceptibility   Pseudomonas aeruginosa - MIC*    CEFTAZIDIME 4 SENSITIVE Sensitive     CIPROFLOXACIN <=0.25 SENSITIVE Sensitive     GENTAMICIN <=1 SENSITIVE Sensitive     IMIPENEM 1 SENSITIVE Sensitive     PIP/TAZO 8 SENSITIVE Sensitive     CEFEPIME 2 SENSITIVE Sensitive     * >=100,000 COLONIES/mL PSEUDOMONAS AERUGINOSA  Resp Panel by RT-PCR (Flu A&B, Covid) Nasopharyngeal Swab     Status: None   Collection Time: 12/10/20 12:05 AM   Specimen: Nasopharyngeal Swab; Nasopharyngeal(NP) swabs in vial transport medium  Result Value Ref Range Status   SARS Coronavirus 2 by RT PCR NEGATIVE NEGATIVE Final    Comment: (NOTE) SARS-CoV-2 target nucleic acids are NOT DETECTED.  The  SARS-CoV-2 RNA is generally detectable in upper respiratory specimens during the acute phase of infection. The lowest concentration of SARS-CoV-2 viral copies this assay can detect is 138 copies/mL. A negative result does not preclude SARS-Cov-2 infection and should not be used as the sole basis for treatment or other patient management decisions. A negative result may occur with  improper specimen collection/handling, submission of specimen other than nasopharyngeal swab, presence of viral mutation(s) within the areas targeted by this assay, and inadequate number of viral copies(<138 copies/mL). A negative result must be combined with clinical observations, patient history, and epidemiological information. The expected result is Negative.  Fact Sheet for Patients:  EntrepreneurPulse.com.au  Fact Sheet for Healthcare Providers:  IncredibleEmployment.be  This test is no t yet approved or cleared by the [place] FDA and  has been authorized for detection and/or diagnosis of SARS-CoV-2 by FDA under an Emergency Use Authorization (EUA). This EUA will remain  in effect (meaning this test can be used) for the duration of the COVID-19 declaration under Section 564(b)(1) of the Act, 21 U.S.C.section 360bbb-3(b)(1), unless the authorization is terminated  or revoked sooner.       Influenza A by PCR NEGATIVE NEGATIVE Final   Influenza B by PCR NEGATIVE NEGATIVE Final    Comment: (NOTE) The Xpert Xpress SARS-CoV-2/FLU/RSV plus assay is intended as an aid in the diagnosis of influenza from Nasopharyngeal swab specimens and should not be used as a sole basis for treatment. Nasal washings and aspirates are unacceptable for Xpert Xpress SARS-CoV-2/FLU/RSV testing.  Fact Sheet for Patients: EntrepreneurPulse.com.au  Fact Sheet for Healthcare Providers: IncredibleEmployment.be  This test is not yet approved or  cleared by the [place] FDA and has been authorized for detection and/or diagnosis of SARS-CoV-2 by FDA under an Emergency Use Authorization (EUA). This EUA will remain in effect (meaning  this test can be used) for the duration of the COVID-19 declaration under Section 564(b)(1) of the Act, 21 U.S.C. section 360bbb-3(b)(1), unless the authorization is terminated or revoked.  Performed at [facility], [place] [address] [place], [place] [address]   Culture, blood (routine x 2)     Status: None   Collection Time: 12/10/20  6:20 PM   Specimen: BLOOD RIGHT HAND  Result Value Ref Range Status   Specimen Description   Final    BLOOD RIGHT HAND Performed at [place] [address] [place], [place] [address]    Special Requests   Final    BOTTLES DRAWN AEROBIC AND ANAEROBIC Blood Culture results may not be optimal due to an excessive volume of blood received in culture bottles Performed at [place] [address], [place], [place] [address]    Culture   Final    NO GROWTH 5 DAYS Performed at [facility], [place] [address], [place], [place] [address]    Report Status 12/15/2020 FINAL  Final  Culture, blood (routine x 2)     Status: None   Collection Time: 12/10/20  6:20 PM   Specimen: BLOOD RIGHT FOREARM  Result Value Ref Range Status   Specimen Description   Final    BLOOD RIGHT FOREARM Performed at [place] [address] [place], [place] [address]    Special Requests   Final    BOTTLES DRAWN AEROBIC ONLY Blood Culture adequate volume Performed at [place] [address], [place], [place] [address]    Culture   Final    NO GROWTH 5 DAYS Performed at [facility], [place] [address], [place], [place] [address]    Report Status 12/15/2020 FINAL  Final      Radiology Studies: No results found.  Scheduled Meds:  aspirin  81 mg Oral Daily   enoxaparin  (LOVENOX) injection  40 mg Subcutaneous Q24H   feeding supplement  1 Container Oral BID BM   feeding supplement  237 mL Oral Q24H   insulin aspart  0-9 Units Subcutaneous TID WC   insulin glargine-yfgn  6 Units Subcutaneous Daily   memantine  5 mg Oral Daily   multivitamin with minerals  1 tablet Oral Daily   pantoprazole (PROTONIX) IV  40 mg Intravenous Q12H   polyethylene glycol  17 g Oral Daily   vitamin B-12  1,000 mcg Oral Daily   Continuous Infusions:  promethazine (PHENERGAN) injection (IM or IVPB) Stopped (12/10/20 1909)     LOS: 9 days   Marylu Lund, MD [facility] Pager On Amion  If 7PM-7AM, please contact night-coverage 12/18/2020, 2:03 PM

## 2020-12-19 DIAGNOSIS — Z515 Encounter for palliative care: Secondary | ICD-10-CM

## 2020-12-19 DIAGNOSIS — F0390 Unspecified dementia without behavioral disturbance: Secondary | ICD-10-CM | POA: Diagnosis not present

## 2020-12-19 DIAGNOSIS — I639 Cerebral infarction, unspecified: Secondary | ICD-10-CM | POA: Diagnosis not present

## 2020-12-19 LAB — COMPREHENSIVE METABOLIC PANEL
ALT: 28 U/L (ref 0–44)
AST: 36 U/L (ref 15–41)
Albumin: 2.9 g/dL — ABNORMAL LOW (ref 3.5–5.0)
Alkaline Phosphatase: 55 U/L (ref 38–126)
Anion gap: 5 (ref 5–15)
BUN: 11 mg/dL (ref 8–23)
CO2: 27 mmol/L (ref 22–32)
Calcium: 9.4 mg/dL (ref 8.9–10.3)
Chloride: 104 mmol/L (ref 98–111)
Creatinine, Ser: 1.03 mg/dL — ABNORMAL HIGH (ref 0.44–1.00)
GFR, Estimated: 54 mL/min — ABNORMAL LOW (ref >60)
Glucose, Bld: 338 mg/dL — ABNORMAL HIGH (ref 70–99)
Potassium: 4.7 mmol/L (ref 3.5–5.1)
Sodium: 136 mmol/L (ref 135–145)
Total Bilirubin: 0.5 mg/dL (ref 0.3–1.2)
Total Protein: 5.7 g/dL — ABNORMAL LOW (ref 6.5–8.1)

## 2020-12-19 LAB — CBC
HCT: 36.7 % (ref 36.0–46.0)
Hemoglobin: 11.5 g/dL — ABNORMAL LOW (ref 12.0–15.0)
MCH: 31 pg (ref 26.0–34.0)
MCHC: 31.3 g/dL (ref 30.0–36.0)
MCV: 98.9 fL (ref 80.0–100.0)
Platelets: 186 10*3/uL (ref 150–400)
RBC: 3.71 MIL/uL — ABNORMAL LOW (ref 3.87–5.11)
RDW: 13.3 % (ref 11.5–15.5)
WBC: 5.4 10*3/uL (ref 4.0–10.5)
nRBC: 0 % (ref 0.0–0.2)

## 2020-12-19 LAB — MAGNESIUM: Magnesium: 1.9 mg/dL (ref 1.7–2.4)

## 2020-12-19 LAB — GLUCOSE, CAPILLARY
Glucose-Capillary: 348 mg/dL — ABNORMAL HIGH (ref 70–99)
Glucose-Capillary: 332 mg/dL — ABNORMAL HIGH (ref 70–99)
Glucose-Capillary: 99 mg/dL (ref 70–99)
Glucose-Capillary: 242 mg/dL — ABNORMAL HIGH (ref 70–99)

## 2020-12-19 MED ORDER — POLYETHYLENE GLYCOL 3350 17 G PO PACK: 17.0000 g | PACK | Freq: Every day | ORAL | 0 refills | Status: AC

## 2020-12-19 MED ORDER — ESOMEPRAZOLE MAGNESIUM 40 MG PO CPDR: 40.0000 mg | DELAYED_RELEASE_CAPSULE | Freq: Two times a day (BID) | ORAL | 1 refills | Status: AC

## 2020-12-19 NOTE — Progress Notes (Signed)
PROGRESS NOTE    Stacey Fuentes  BWL:[id number] DOB: 03/29/1935 DOA: 12/09/2020 PCP: Harlan Stains, MD    Brief Narrative:  85 y.o. female with a PMH significant for dementia, type II DM, HLD, recent SCA CVA. They presented from home to the ED on 12/09/2020 with acute change in mental status associated with N/V, abdominal pain x 1 days. In the ED, it was found that they had urinary tract infection associated with Foley catheter. They were treated with empiric antibiotics, supportive care.  Patient was admitted to medicine service for further workup and management of sepsis as outlined in detail below.  Daughter stating on presentation to the ED on 12/09/2020 patient had what she describes as coffee-ground emesis and although hemoglobin has remained stable and with no further coffee-ground emesis is insistent on further evaluation by GI.  GI consulted.  Assessment & Plan:   Principal Problem:   Sepsis due to Pseudomonas [facility]) Active Problems:   Essential hypertension   Cerebellar infarct ([place])   Dementia without behavioral disturbance (HCC)   Type 2 diabetes mellitus with other specified complication (HCC)   UTI (urinary tract infection)   Coffee ground emesis   GERD (gastroesophageal reflux disease)  #1 sepsis secondary to Pseudomonas UTI with acute metabolic encephalopathy, POA -Patient on presentation noted-acute metabolic encephalopathy noted to be more confused than baseline. -COVID-19 PCR negative. -Urine cultures with Pseudomonas, found to be pan-sensitive -Blood cultures pending with no growth to date. -Completed course of broad spectrum abx -remains afebrile, without leukocytosis, currently remains medically stable  2.  Type 2 diabetes mellitus with long-term insulin use -Hemoglobin A1c 8.3 99/28/2022) -currently on Semglee 6 units daily, SSI. -glucose in the 200's. Will increase long-acting insulin to 10 units daily  3.  History of recent CVA with residual  weakness -Pt is continued on aspirin for secondary stroke prophylaxis. -Continue statin.  4.  Hematuria -Urinalysis with 11-20 RBCs. -Likely secondary to acute UTI. -completed on IV antibiotics.  5. Reported possible Coffee-ground emesis -Noted per daughter when patient was seen in the ED on 12/09/2020, daughter states she witnessed patient vomiting blood/brownish coffee-ground emesis. -No further hematemesis or coffee-ground emesis noted during this hospitalization thus far and pt has remained hemodynamically stable -Hgb trends reviewed, have remained stable with no evidence of blood loss. -Patient is continued on IV PPI. Will transition to PO protonix -Family had reportedly been insistent on further evaluation and requesting GI consultation -GI was then consulted for possible EGD per family request. Given recent CVA, case was discussed with on-call Neurologist, Dr. Rory Percy, who recommends holding off on non-emergent EGD for at least 3 months, citing increased risk for peri-procedural complications, including new strokes, if performed sooner -As such, GI recommended outpatient f/u for consideration for EGD once she is 3 months out from CVA. GI recommendation for BID PPI x 8 weeks. GI has since signed off -Had discussed with and updated family regarding above recommendations on 12/12/2020 over phone, spoke with daughter [name].  Family is in full agreement. -Recent hemoglobin trends have increased, most recently up to 11.3  6.  Hypertension -BP noted to be borderline -Discontinued Norvasc, bp stable -Avoid hypotension given recent CVA  7.  Dementia -Continue home regimen Namenda. -Suspect related to poor p.o. intake. -Appreciate Nutrition input. Significant wt loss and decreased interest in food observed likely due to both acute illness and progressive dementia -Recently discussed with pt's daughter over phone who has observed markedly poor PO intake even prior to admit following CVA. Also  noted to have a dramatic function decline. Daughter understanding of natural progression of likely vascular dementia and is agreeable to discussion with Palliative Care for goals of care -Seen by palliative care and family has elected to continue with full code/full measures but wishes to take patient home with palliative care to follow as an outpatient to monitor progression  8.  GERD -Continued on PPI.  9.  Normocytic anemia -Patient with no overt bleeding during this hospitalization. -Hemoglobin has trended up to 11.2 on most recent check -Without evidence of acute blood loss, staff reports brown-colored stools with no evidence of melena or blood -GI recs for 8 weeks of BID PPI per above  10.  Constipation -Daughter over phone reports concerns of constipation recently -Resolved.  Multiple medium size stools noted recently -Patient is now continued on daily miralax, continue with cathartics as needed  11.  Hypomagnesemia, hypokalemia -Suspect secondary to chronic malnutrition, in setting of known dementia -K 3.4, will correct -Mg corrected -Continue to follow lytes and replace as needed  DVT prophylaxis: Lovenox subq Code Status: Full Family Communication: Pt in room, tried calling patient's daughter, unable to reach her, left voicemail  Status is: Inpatient  Remains inpatient appropriate because: Severity of illness   Consultants:  GI Discussed case with on-call neurologist Palliative Care  Procedures:    Antimicrobials: Anti-infectives (From admission, onward)    Start     Dose/Rate Route Frequency Ordered Stop   12/10/20 2200  piperacillin-tazobactam (ZOSYN) IVPB 3.375 g        3.375 g 12.5 mL/hr over 240 Minutes Intravenous Every 8 hours 12/10/20 1644 12/15/20 0133   12/10/20 1645  piperacillin-tazobactam (ZOSYN) IVPB 3.375 g        3.375 g 100 mL/hr over 30 Minutes Intravenous  Once 12/10/20 1644 12/10/20 1902   12/10/20 0000  cefTRIAXone (ROCEPHIN) 2 g in  sodium chloride 0.9 % 100 mL IVPB  Status:  Discontinued        2 g 200 mL/hr over 30 Minutes Intravenous Every 24 hours 12/09/20 2354 12/10/20 1644   12/09/20 2330  cefTRIAXone (ROCEPHIN) 1 g in sodium chloride 0.9 % 100 mL IVPB  Status:  Discontinued        1 g 200 mL/hr over 30 Minutes Intravenous  Once 12/09/20 2329 12/09/20 2357       Subjective: Patient is awake, does not appear in any distress, answers yes and no to questions.  Does not elaborate any further.  Objective: Vitals:   12/18/20 1230 12/18/20 2043 12/19/20 0512 12/19/20 1117  BP: 111/63 (!) 124/59 (!) 117/58 109/67  Pulse: (!) 108 100 96 (!) 105  Resp: 20 16 18 16   Temp: 97.6 F (36.4 C) 98 F (36.7 C) 98 F (36.7 C) 98 F (36.7 C)  TempSrc: Oral   Oral  SpO2: 98% 96% 100% 97%  Weight:      Height:        Intake/Output Summary (Last 24 hours) at 12/19/2020 2022 Last data filed at 12/19/2020 1352 Gross per 24 hour  Intake 290 ml  Output --  Net 290 ml   Filed Weights   12/10/20 1700 12/10/20 2250  Weight: 72.6 kg 66.7 kg    Examination: General exam: Alert, awake, no distress Respiratory system: Clear to auscultation. Respiratory effort normal. Cardiovascular system:RRR. No murmurs, rubs, gallops. Gastrointestinal system: Abdomen is nondistended, soft and nontender. No organomegaly or masses felt. Normal bowel sounds heard. Central nervous system: No focal neurological deficits. Extremities: No C/C/E, +  pedal pulses Skin: No rashes, lesions or ulcers Psychiatry: Confused   Data Reviewed: I have personally reviewed following labs and imaging studies  CBC: Recent Labs  Lab 12/13/20 1255 12/16/20 0540 12/19/20 0447  WBC 5.5 5.1 5.4  HGB 11.3* 11.2* 11.5*  HCT 34.4* 34.6* 36.7  MCV 94.0 96.6 98.9  PLT 226 194 030   Basic Metabolic Panel: Recent Labs  Lab 12/13/20 1255 12/14/20 0435 12/14/20 1822 12/15/20 1645 12/16/20 0540 12/17/20 0421 12/18/20 0440 12/19/20 0447  NA 138 140   <  > 137 138 138 136 136  K 3.0* 3.0*   < > 4.7 3.9 4.1 3.4* 4.7  CL 105 106   < > 108 105 102 101 104  CO2 27 24   < > 23 25 26 28 27   GLUCOSE 166* 83   < > 228* 179* 156* 251* 338*  BUN 6* 7*   < > 8 5* 6* 7* 11  CREATININE 0.88 0.97   < > 0.96 0.68 0.67 0.77 1.03*  CALCIUM 8.5* 8.9   < > 9.1 9.1 9.0 9.3 9.4  MG 2.2 2.0  --   --   --  1.6* 2.2 1.9   < > = values in this interval not displayed.   GFR: Estimated Creatinine Clearance: 37 mL/min (A) (by C-G formula based on SCr of 1.03 mg/dL (H)). Liver Function Tests: Recent Labs  Lab 12/15/20 0450 12/16/20 0540 12/17/20 0421 12/18/20 0440 12/19/20 0447  AST 28 29 33 29 36  ALT 17 22 20 26 28   ALKPHOS 41 48 43 48 55  BILITOT 0.7 0.6 0.9 0.3 0.5  PROT 5.5* 5.7* 5.6* 5.7* 5.7*  ALBUMIN 2.7* 2.9* 2.7* 2.8* 2.9*   No results for input(s): LIPASE, AMYLASE in the last 168 hours.  No results for input(s): AMMONIA in the last 168 hours. Coagulation Profile: No results for input(s): INR, PROTIME in the last 168 hours. Cardiac Enzymes: No results for input(s): CKTOTAL, CKMB, CKMBINDEX, TROPONINI in the last 168 hours. BNP (last 3 results) No results for input(s): PROBNP in the last 8760 hours. HbA1C: No results for input(s): HGBA1C in the last 72 hours. CBG: Recent Labs  Lab 12/18/20 1655 12/18/20 2043 12/19/20 0729 12/19/20 1114 12/19/20 1628  GLUCAP 127* 158* 348* 332* 99   Lipid Profile: No results for input(s): CHOL, HDL, LDLCALC, TRIG, CHOLHDL, LDLDIRECT in the last 72 hours. Thyroid Function Tests: No results for input(s): TSH, T4TOTAL, FREET4, T3FREE, THYROIDAB in the last 72 hours. Anemia Panel: No results for input(s): VITAMINB12, FOLATE, FERRITIN, TIBC, IRON, RETICCTPCT in the last 72 hours. Sepsis Labs: No results for input(s): PROCALCITON, LATICACIDVEN in the last 168 hours.   Recent Results (from the past 240 hour(s))  Urine Culture     Status: Abnormal   Collection Time: 12/09/20  9:37 PM   Specimen:  Urine, Catheterized  Result Value Ref Range Status   Specimen Description   Final    URINE, CATHETERIZED Performed at [facility] [address] [place], [place] [address]    Special Requests   Final    NONE Performed at [facility], [place] [address] [place], [place] [address]    Culture >=100,000 COLONIES/mL PSEUDOMONAS AERUGINOSA (A)  Final   Report Status 12/12/2020 FINAL  Final   Organism ID, Bacteria PSEUDOMONAS AERUGINOSA (A)  Final      Susceptibility   Pseudomonas aeruginosa - MIC*    CEFTAZIDIME 4 SENSITIVE Sensitive  CIPROFLOXACIN <=0.25 SENSITIVE Sensitive     GENTAMICIN <=1 SENSITIVE Sensitive     IMIPENEM 1 SENSITIVE Sensitive     PIP/TAZO 8 SENSITIVE Sensitive     CEFEPIME 2 SENSITIVE Sensitive     * >=100,000 COLONIES/mL PSEUDOMONAS AERUGINOSA  Resp Panel by RT-PCR (Flu A&B, Covid) Nasopharyngeal Swab     Status: None   Collection Time: 12/10/20 12:05 AM   Specimen: Nasopharyngeal Swab; Nasopharyngeal(NP) swabs in vial transport medium  Result Value Ref Range Status   SARS Coronavirus 2 by RT PCR NEGATIVE NEGATIVE Final    Comment: (NOTE) SARS-CoV-2 target nucleic acids are NOT DETECTED.  The SARS-CoV-2 RNA is generally detectable in upper respiratory specimens during the acute phase of infection. The lowest concentration of SARS-CoV-2 viral copies this assay can detect is 138 copies/mL. A negative result does not preclude SARS-Cov-2 infection and should not be used as the sole basis for treatment or other patient management decisions. A negative result may occur with  improper specimen collection/handling, submission of specimen other than nasopharyngeal swab, presence of viral mutation(s) within the areas targeted by this assay, and inadequate number of viral copies(<138 copies/mL). A negative result must be combined with clinical observations, patient history, and epidemiological information. The expected result is  Negative.  Fact Sheet for Patients:  EntrepreneurPulse.com.au  Fact Sheet for Healthcare Providers:  IncredibleEmployment.be  This test is no t yet approved or cleared by the [place] FDA and  has been authorized for detection and/or diagnosis of SARS-CoV-2 by FDA under an Emergency Use Authorization (EUA). This EUA will remain  in effect (meaning this test can be used) for the duration of the COVID-19 declaration under Section 564(b)(1) of the Act, 21 U.S.C.section 360bbb-3(b)(1), unless the authorization is terminated  or revoked sooner.       Influenza A by PCR NEGATIVE NEGATIVE Final   Influenza B by PCR NEGATIVE NEGATIVE Final    Comment: (NOTE) The Xpert Xpress SARS-CoV-2/FLU/RSV plus assay is intended as an aid in the diagnosis of influenza from Nasopharyngeal swab specimens and should not be used as a sole basis for treatment. Nasal washings and aspirates are unacceptable for Xpert Xpress SARS-CoV-2/FLU/RSV testing.  Fact Sheet for Patients: EntrepreneurPulse.com.au  Fact Sheet for Healthcare Providers: IncredibleEmployment.be  This test is not yet approved or cleared by the [place] FDA and has been authorized for detection and/or diagnosis of SARS-CoV-2 by FDA under an Emergency Use Authorization (EUA). This EUA will remain in effect (meaning this test can be used) for the duration of the COVID-19 declaration under Section 564(b)(1) of the Act, 21 U.S.C. section 360bbb-3(b)(1), unless the authorization is terminated or revoked.  Performed at [facility], [place] [address] [place], [place] [address]   Culture, blood (routine x 2)     Status: None   Collection Time: 12/10/20  6:20 PM   Specimen: BLOOD RIGHT HAND  Result Value Ref Range Status   Specimen Description   Final    BLOOD RIGHT HAND Performed at [facility] [address],  [place], [place] [address]    Special Requests   Final    BOTTLES DRAWN AEROBIC AND ANAEROBIC Blood Culture results may not be optimal due to an excessive volume of blood received in culture bottles Performed at [facility] [address], [place], [place] [address]    Culture   Final    NO GROWTH 5 DAYS Performed at [facility], [place] [address],  [place], [place] [address]    Report Status 12/15/2020 FINAL  Final  Culture, blood (routine x 2)     Status: None   Collection Time: 12/10/20  6:20 PM   Specimen: BLOOD RIGHT FOREARM  Result Value Ref Range Status   Specimen Description   Final    BLOOD RIGHT FOREARM Performed at [facility] [address], [place], [place] [address]    Special Requests   Final    BOTTLES DRAWN AEROBIC ONLY Blood Culture adequate volume Performed at [facility] [address] [place], [place] [address]    Culture   Final    NO GROWTH 5 DAYS Performed at [facility], [place] [address], [place], [place] [address]    Report Status 12/15/2020 FINAL  Final      Radiology Studies: No results found.  Scheduled Meds:  aspirin  81 mg Oral Daily   enoxaparin (LOVENOX) injection  40 mg Subcutaneous Q24H   feeding supplement  1 Container Oral BID BM   feeding supplement  237 mL Oral Q24H   insulin aspart  0-9 Units Subcutaneous TID WC   insulin glargine-yfgn  10 Units Subcutaneous Daily   memantine  5 mg Oral Daily   multivitamin with minerals  1 tablet Oral Daily   pantoprazole  40 mg Oral BID   polyethylene glycol  17 g Oral Daily   vitamin B-12  1,000 mcg Oral Daily   Continuous Infusions:  promethazine (PHENERGAN) injection (IM or IVPB) Stopped (12/10/20 1909)     LOS: 10 days   Kathie Dike, MD [facility] Pager On [place]  If 7PM-7AM, please contact night-coverage 12/19/2020, 8:22 PM

## 2020-12-19 NOTE — Progress Notes (Signed)
[id number]  [facility]) Hospital Liaison Note  Notified by [facility] manager Juliann Pulse) of patient/family request for [facility] palliative services at home after discharge.   [facility] hospital liaison will follow patient for discharge disposition.   Please call with any hospice or outpatient palliative care related questions.   Thank you for the opportunity to participate in this patient's care.   Zollie Pee [facility] Liaison [phone]

## 2020-12-19 NOTE — TOC Progression Note (Signed)
Transition of Care [facility]) - Progression Note    Patient Details  Name: Stacey Fuentes MRN: [id number] Date of Birth: [birth year]  Transition of Care [facility]) CM/SW Contact  Cederic Mozley, Juliann Pulse, RN Phone Number: 12/19/2020, 3:16 PM  Clinical Narrative: d/c plan in early am with [facility]-rep Levada Dy aware-[facility] has already delivered rw to dtr-she will take home today;already has hospital bed w/mattress @ home-dtr will pruchase a different higher grade mattress on own. Elvis Coil are for palliative care services agreed-rep [facility] received referral. PTAR for early transport home-MD updated.       Expected Discharge Plan: [place] Barriers to Discharge: Continued Medical Work up  Expected Discharge Plan and Services Expected Discharge Plan: [place]   Discharge Planning Services: CM Consult Post Acute Care Choice: [place] arrangements for the past 2 months: Single Family Home Expected Discharge Date:  (unknown)               DME Arranged: Walker rolling DME Agency: [facility] Date DME Agency Contacted: 12/11/20 Time DME Agency Contacted: 50 Representative spoke with at DME Agency: [place]: RN, PT, OT, Nurse's Aide, Social Work [employer] Agency: [facility] [place] Date [place]: 12/11/20 Time [place]: 1356 Representative spoke with at [place]: [place] ([place]) Interventions    Readmission Risk Interventions No flowsheet data found.

## 2020-12-19 NOTE — Progress Notes (Signed)
Occupational Therapy Treatment Patient Details Name: Stacey Fuentes MRN: [id number] DOB: 11-28-1935 Today's Date: 12/19/2020   History of present illness 85 y.o. female presenting to ED 10/30 with worsening AMS, abdominal pain and N/V x1 day. Patient admitted with catheter associated UTI. Recent hospital admission 10/18-10/22 wiht AMS and DKA. PMHx significant for dementia, DMII, HLD, benign essential tremor, CKD II, HLD, OA and recent CVA.   OT comments  OT treatment session with focus on static sitting balance, sit to stand transfers and command following. Patient continues to require Total A grossly for self-care tasks and Mod A +2 for sit to stand transfers. Noted improved alertness this date with patient attempting to verbally answer therapists questions. Able to state name and color of this therapists scrubs. Does not make eye contact with this writer during treatment session but does laugh and clap hands to music with cues. OT will continue to follow acutely.    Recommendations for follow up therapy are one component of a multi-disciplinary discharge planning process, led by the attending physician.  Recommendations may be updated based on patient status, additional functional criteria and insurance authorization.    Follow Up Recommendations  Home health OT (Family declining SNF placement.)    Assistance Recommended at Discharge Frequent or constant Supervision/Assistance  Equipment Recommendations  Other (comment) (TBD)    Recommendations for Other Services      Precautions / Restrictions Precautions Precautions: Fall Precaution Comments: High fall risk Restrictions Weight Bearing Restrictions: No       Mobility Bed Mobility Overal bed mobility: Needs Assistance Bed Mobility: Supine to Sit;Sit to Supine     Supine to sit: Max assist Sit to supine: Max assist   General bed mobility comments: Max A and Max verbal cues for supine <> EOB with use of chuck pad.     Transfers Overall transfer level: Needs assistance Equipment used: Rolling walker (2 wheels) Transfers: Sit to/from Stand Sit to Stand: Mod assist;+2 physical assistance;+2 safety/equipment           General transfer comment: Mod A +2 for sit to stand from EOB x2 trials with Max mutlimodal cues for hand placement.     Balance Overall balance assessment: Needs assistance Sitting-balance support: Feet supported;No upper extremity supported;Single extremity supported Sitting balance-Leahy Scale: Fair Sitting balance - Comments: Able to maintain static sitting balance at EOB without external assist. Postural control: Posterior lean Standing balance support: Bilateral upper extremity supported Standing balance-Leahy Scale: Poor Standing balance comment: relies on external assist +2 and BUE support                           ADL either performed or assessed with clinical judgement   ADL                                              Extremity/Trunk Assessment              Vision       Perception     Praxis      Cognition Arousal/Alertness: Awake/alert Behavior During Therapy: Flat affect Overall Cognitive Status: History of cognitive impairments - at baseline                                 General Comments:  Noted improved alertness from time of evaluation. Patient able to state first name. Unable to recall season or year. Able to identify color of this writers scrubs. Follows 1-step verbal commands with 25% accuracy.          Exercises Other Exercises Other Exercises: BUE AAROM in all places of motion x10 reps each in rhythm to music. Hand over hand assist required.   Shoulder Instructions       General Comments      Pertinent Vitals/ Pain       Pain Assessment: Faces Faces Pain Scale: No hurt Pain Intervention(s): Monitored during session  Home Living                                           Prior Functioning/Environment              Frequency  Min 2X/week        Progress Toward Goals  OT Goals(current goals can now be found in the care plan section)  Progress towards OT goals: Progressing toward goals  Acute Rehab OT Goals Patient Stated Goal: Per family, for patient to return home. OT Goal Formulation: Patient unable to participate in goal setting Time For Goal Achievement: 12/25/20 Potential to Achieve Goals: Fair ADL Goals Pt Will Perform Eating: with min assist;sitting Pt Will Perform Grooming: with min assist;sitting Pt Will Perform Upper Body Bathing: with min assist;sitting Pt Will Perform Upper Body Dressing: with min assist;sitting Additional ADL Goal #1: Patient will follow 1-step verbal command with 75% accuracy in prep for ADLs. Additional ADL Goal #2: Pt will complete bed mobility with mod assist +2 as percursor to ADls.  Plan Discharge plan remains appropriate;Frequency remains appropriate    Co-evaluation                 AM-PAC OT "6 Clicks" Daily Activity     Outcome Measure   Help from another person eating meals?: Total Help from another person taking care of personal grooming?: Total Help from another person toileting, which includes using toliet, bedpan, or urinal?: Total Help from another person bathing (including washing, rinsing, drying)?: Total Help from another person to put on and taking off regular upper body clothing?: Total Help from another person to put on and taking off regular lower body clothing?: Total 6 Click Score: 6    End of Session Equipment Utilized During Treatment: Gait belt;Rolling walker (2 wheels)  OT Visit Diagnosis: Other abnormalities of gait and mobility (R26.89);Muscle weakness (generalized) (M62.81);Cognitive communication deficit (R41.841);Other symptoms and signs involving cognitive function Symptoms and signs involving cognitive functions: Cerebral infarction   Activity Tolerance  Patient tolerated treatment well   Patient Left in bed;with call bell/phone within reach;with bed alarm set   Nurse Communication Mobility status        Time: 1191-4782 OT Time Calculation (min): 26 min  Charges: OT General Charges $OT Visit: 1 Visit OT Treatments $Therapeutic Activity: 23-37 mins  Evah Rashid H. OTR/L Supplemental OT, Department of rehab services [phone]  Mort Smelser R H. 12/19/2020, 11:59 AM

## 2020-12-19 NOTE — Consult Note (Signed)
Consultation Note Date: 12/19/2020   Patient Name: Stacey Fuentes  DOB: 18-Jan-1936  MRN: [id number]  Age / Sex: 85 y.o., female  PCP: Harlan Stains, MD Referring Physician: Kathie Dike, MD  Reason for Consultation: Establishing goals of care  HPI/Patient Profile: 85 y.o. female  admitted on 12/09/2020    Clinical Assessment and Goals of Care: 85 year old lady with a past medical history of dementia diabetes dyslipidemia recent stroke 1 month ago, presented from home to the emergency department with acute change in mental status.  Was found to have nausea vomiting abdominal pain admitted for urinary tract infection treated with empiric antibiotics.  Hospital course complicated by possible coffee-ground emesis, found to have sepsis secondary to Pseudomonas urinary tract infection as well as acute metabolic encephalopathy.  Patient's probably now at her baseline mental status as well as oral intake has picked up.  Still requiring safety sitter periodically.  Palliative consult for goals of care discussions has been requested. Patient is awake alert sitting up by the side of her bed.  She is able to state her name but does not know that she is in the hospital, does not know what year we are living in.  She states that she had a total of 8 children.  She is able to state her daughter's name [name]. Call placed and discussed with patient's daughter: Palliative medicine is specialized medical care for people living with serious illness. It focuses on providing relief from the symptoms and stress of a serious illness. The goal is to improve quality of life for both the patient and the family. Goals of care: Broad aims of medical therapy in relation to the patient's values and preferences. Our aim is to provide medical care aimed at enabling patients to achieve the goals that matter most to them, given the  circumstances of their particular medical situation and their constraints.   Brief life review performed.  We discussed about appropriate goals and disposition options going forward.  See discussions below.  Thank you for the consult.  NEXT OF KIN Daughter [name]  SUMMARY OF RECOMMENDATIONS   Full code/full scope care for now Discharge home with home-based physical therapy occupational therapy and the patient's family is accepting of home-based palliative support.  They wish to monitor the patient's overall condition with the help of palliative support to assess for hospice eligibility going forward. Thank you for the consult.  Code Status/Advance Care Planning: Full code   Symptom Management:     Palliative Prophylaxis:  Delirium Protocol  Additional Recommendations (Limitations, Scope, Preferences): Full Scope Treatment  Psycho-social/Spiritual:  Desire for further Chaplaincy support:yes Additional Recommendations: Caregiving  Support/Resources  Prognosis:  Unable to determine  Discharge Planning: Home with Home Health      Primary Diagnoses: Present on Admission:  UTI (urinary tract infection)  Sepsis due to Pseudomonas ([place])  Type 2 diabetes mellitus with other specified complication ([place])  Essential hypertension  Dementia without behavioral disturbance ([place])  Cerebellar infarct ([place])  GERD (gastroesophageal reflux disease)   I  have reviewed the medical record, interviewed the patient and family, and examined the patient. The following aspects are pertinent.  Past Medical History:  Diagnosis Date   Benign essential tremor    Cataract    CKD (chronic kidney disease), stage II    Dementia (HCC)    Diabetes mellitus ([place])    type2   Full dentures    Hyperlipidemia    Hypertension    Memory difficulty    Osteoporosis    Uncontrolled diabetes mellitus type 2 without complications 75/91/6384   Vitamin D deficiency    Wears glasses    Social History    Socioeconomic History   Marital status: Widowed    Spouse name: Not on file   Number of children: 7   Years of education: Not on file   Highest education level: 8th grade  Occupational History   Occupation: retired    Comment: sewing Glass blower/designer  Tobacco Use   Smoking status: Never   Smokeless tobacco: Never  Vaping Use   Vaping Use: Never used  Substance and Sexual Activity   Alcohol use: No   Drug use: No   Sexual activity: Not on file  Other Topics Concern   Not on file  Social History Narrative   09/06/19 lives with dgtr, [name]   Very little caffeine   Social Determinants of Health   Financial Resource Strain: Not on file  Food Insecurity: Not on file  Transportation Needs: Not on file  Physical Activity: Not on file  Stress: Not on file  Social Connections: Not on file   Family History  Problem Relation Age of Onset   Stroke Mother    Diabetes Mother    Tuberculosis Mother    Other Father        homicide   Colon cancer Neg Hx    Esophageal cancer Neg Hx    Rectal cancer Neg Hx    Stomach cancer Neg Hx    Scheduled Meds:  aspirin  81 mg Oral Daily   enoxaparin (LOVENOX) injection  40 mg Subcutaneous Q24H   feeding supplement  1 Container Oral BID BM   feeding supplement  237 mL Oral Q24H   insulin aspart  0-9 Units Subcutaneous TID WC   insulin glargine-yfgn  10 Units Subcutaneous Daily   memantine  5 mg Oral Daily   multivitamin with minerals  1 tablet Oral Daily   pantoprazole  40 mg Oral BID   polyethylene glycol  17 g Oral Daily   vitamin B-12  1,000 mcg Oral Daily   Continuous Infusions:  promethazine (PHENERGAN) injection (IM or IVPB) Stopped (12/10/20 1909)   PRN Meds:.acetaminophen, bisacodyl, melatonin, promethazine (PHENERGAN) injection (IM or IVPB) Medications Prior to Admission:  Prior to Admission medications   Medication Sig Start Date End Date Taking? Authorizing Provider  amLODipine (NORVASC) 2.5 MG tablet Take 1 tablet  (2.5 mg total) by mouth daily. 12/01/20  Yes Ghimire, Henreitta Leber, MD  aspirin 81 MG tablet Take 1 tablet (81 mg total) by mouth daily. 12/01/20  Yes Ghimire, Henreitta Leber, MD  atorvastatin (LIPITOR) 40 MG tablet Take 1 tablet (40 mg total) by mouth daily. 12/01/20  Yes Ghimire, Henreitta Leber, MD  esomeprazole (NEXIUM) 40 MG capsule Take 1 capsule (40 mg total) by mouth daily. 12/01/20  Yes Ghimire, Henreitta Leber, MD  insulin glargine (LANTUS) 100 UNIT/ML Solostar Pen Inject 12 Units into the skin daily. 12/01/20  Yes Ghimire, Henreitta Leber, MD  memantine (NAMENDA) 5 MG  tablet Take 1 tablet (5 mg total) by mouth daily. 12/01/20  Yes Ghimire, Henreitta Leber, MD  Multiple Vitamin (MULTIVITAMIN WITH MINERALS) TABS tablet Take 1 tablet by mouth daily. 12/01/20  Yes Ghimire, Henreitta Leber, MD  blood glucose meter kit and supplies Dispense based on patient and insurance preference. Use up to four times daily as directed. (FOR ICD-10 E10.9, E11.9). 12/03/20   Veryl Speak, MD  calcium carbonate (OSCAL) 1500 (600 Ca) MG TABS tablet Take 1 tablet (1,500 mg total) by mouth daily with breakfast. Patient not taking: No sig reported 12/01/20   Jonetta Osgood, MD  insulin aspart (NOVOLOG) 100 UNIT/ML injection Inject 0-10 Units into the skin as directed. 0-9 Units, Subcutaneous, 3 times daily with meals: 0-69 implement hypoglycemic measures, 70-150=0 units, 151-199=2 units, 200-249=4 units, 250-299=6 units, 300-349=8 units, 350-399=10 units, [phone] Notify MD 12/03/20   Veryl Speak, MD  Insulin Pen Needle 32G X 4 MM MISC 1 each by Does not apply route as needed. 12/01/20   Ghimire, Henreitta Leber, MD  latanoprost (XALATAN) 0.005 % ophthalmic solution Place 1 drop into both eyes every evening. Patient not taking: No sig reported 12/01/20   Ghimire, Henreitta Leber, MD  Omega-3 Fatty Acids (FISH OIL) 1000 MG CAPS Take 1 capsule (1,000 mg total) by mouth daily. Patient not taking: No sig reported 12/01/20   Jonetta Osgood, MD  vitamin B-12  (CYANOCOBALAMIN) 1000 MCG tablet Take 1 tablet (1,000 mcg total) by mouth daily. Patient not taking: No sig reported 12/01/20   Jonetta Osgood, MD   Allergies  Allergen Reactions   Citalopram Nausea Only    Abd pain   Metformin And Related Nausea Only   Saxagliptin Nausea Only   Review of Systems +weakness  Physical Exam Patient is awake alert sitting up in bed Safety sitter at bedside Patient is able to state her name, not oriented to person or place. Answers a few questions appropriately Has baseline dementia, difficult to assess mentation  regular work of breathing S1-S2   Vital Signs: BP 109/67 (BP Location: Left Arm)   Pulse (!) 105   Temp 98 F (36.7 C) (Oral)   Resp 16   Ht $R'5\' 2"'Ro$  (1.575 m)   Wt 66.7 kg   SpO2 97%   BMI 26.90 kg/m  Pain Scale: PAINAD   Pain Score: 0-No pain   SpO2: SpO2: 97 % O2 Device:SpO2: 97 % O2 Flow Rate: .   IO: Intake/output summary:  Intake/Output Summary (Last 24 hours) at 12/19/2020 1433 Last data filed at 12/19/2020 1352 Gross per 24 hour  Intake 530 ml  Output --  Net 530 ml    LBM: Last BM Date: 12/18/20 Baseline Weight: Weight: 72.6 kg Most recent weight: Weight: 66.7 kg     Palliative Assessment/Data:   PPS 50%  Time In:  1330 Time Out:  1430 Time Total:  60  Greater than 50%  of this time was spent counseling and coordinating care related to the above assessment and plan.  Signed by: Loistine Chance, MD   Please contact Palliative Medicine Team phone at [phone] for questions and concerns.  For individual provider: See Shea Evans

## 2020-12-20 LAB — GLUCOSE, CAPILLARY
Glucose-Capillary: 306 mg/dL — ABNORMAL HIGH (ref 70–99)
Glucose-Capillary: 294 mg/dL — ABNORMAL HIGH (ref 70–99)

## 2020-12-20 NOTE — Progress Notes (Signed)
RN removed IV. Patient discharge instructions being sent with patient. Patient discharging home via [place].

## 2020-12-20 NOTE — TOC Transition Note (Signed)
Transition of Care [facility]) - CM/SW Discharge Note   Patient Details  Name: Stacey Fuentes MRN: [id number] Date of Birth: 1935-09-26  Transition of Care [facility] CM/SW Contact:  Stacey Phi, RN Phone Number: 12/20/2020, 10:51 AM   Clinical Narrative: Spoke to dtr Stacey Fuentes-agree to d/c today home w/HHC;dme see prior note,& below. PTAR called for d/c. No further CM needs.      Final next level of care: [place] Barriers to Discharge: No Barriers Identified   Patient Goals and CMS Choice Patient states their goals for this hospitalization and ongoing recovery are:: go home CMS Medicare.gov Compare Post Acute Care list provided to:: Patient Represenative (must comment) Choice offered to / list presented to : Adult Children  Discharge Placement                Patient to be transferred to facility by: [place] Name of family member notified: Stacey Fuentes dtr [phone] Patient and family notified of of transfer: 12/20/20  Discharge Plan and Services   Discharge Planning Services: CM Consult Post Acute Care Choice: Home Health          DME Arranged: Gilford Rile rolling DME Agency: [employer] Date DME Agency Contacted: 12/11/20 Time DME Agency Contacted: 11 Representative spoke with at DME Agency: [place]: RN, PT, OT, Nurse's Aide, Social Work [employer] Agency: [facility] [place] Date [place]: 12/11/20 Time [place]: [id number] Representative spoke with at [place]: [place] (Media) Interventions     Readmission Risk Interventions No flowsheet data found.

## 2020-12-20 NOTE — Discharge Summary (Signed)
Physician Discharge Summary  Stacey Fuentes TIW:[id number] DOB: 1935/05/23 DOA: 12/09/2020  PCP: Stacey Stains, MD  Admit date: 12/09/2020 Discharge date: 12/20/2020  Admitted From: Home Disposition: Home  Recommendations for Outpatient Follow-up:  Follow up with PCP in 1-2 weeks Please obtain BMP/CBC in one week Outpatient palliative care to follow patient to further address goals of care  Home Health: Home health PT, OT, RN, aide, social work Equipment/Devices:  Discharge Condition: Stable CODE STATUS: Full code Diet recommendation: Pured diet  Brief/Interim Summary: 85 year old female with a history of dementia, diabetes, recent CVA, presents to the hospital with altered mental status.  She was found to have urinary tract infection.  She was treated with intravenous antibiotics.  Overall mental status improved back to baseline.  It was described of her having coffee-ground emesis prior to admission.  She was seen by GI.  It was felt that with her recent stroke, undergoing EGD would be high risk.  Recommendations were to wait at least 3 months from her stroke prior to undergoing any procedures.  Overall hemoglobin remained stable through her hospitalization.  She was seen by palliative care and goals of care were discussed.  Family wishes to take the patient home for now and have palliative care follow as an outpatient  Discharge Diagnoses:  Principal Problem:   Sepsis due to Pseudomonas [facility]) Active Problems:   Essential hypertension   Cerebellar infarct ([place])   Dementia without behavioral disturbance ([place])   Type 2 diabetes mellitus with other specified complication ([place])   UTI (urinary tract infection)   Coffee ground emesis   GERD (gastroesophageal reflux disease)  1 sepsis secondary to Pseudomonas UTI with acute metabolic encephalopathy, POA -Patient on presentation noted-acute metabolic encephalopathy noted to be more confused than baseline. -COVID-19 PCR  negative. -Urine cultures with Pseudomonas, found to be pan-sensitive -Blood cultures pending with no growth to date. -Completed course of broad spectrum abx -remains afebrile, without leukocytosis, currently remains medically stable  2.  Type 2 diabetes mellitus with long-term insulin use -Hemoglobin A1c 8.3 99/28/2022) -Resume Lantus and NovoLog on discharge  3.  History of recent CVA with residual weakness -Pt is continued on aspirin for secondary stroke prophylaxis. -Continue statin.  4.  Hematuria -Urinalysis with 11-20 RBCs. -Likely secondary to acute UTI. -completed on IV antibiotics.  5. Reported possible Coffee-ground emesis -Noted per daughter when patient was seen in the ED on 12/09/2020, daughter states she witnessed patient vomiting blood/brownish coffee-ground emesis. -No further hematemesis or coffee-ground emesis noted during this hospitalization thus far and pt has remained hemodynamically stable -Hgb trends reviewed, have remained stable with no evidence of blood loss. -Patient is continued on IV PPI. Will transition to PO protonix -Family had reportedly been insistent on further evaluation and requesting GI consultation -GI was then consulted for possible EGD per family request. Given recent CVA, case was discussed with on-call Neurologist, Dr. Rory Fuentes, who recommends holding off on non-emergent EGD for at least 3 months, citing increased risk for peri-procedural complications, including new strokes, if performed sooner -As such, GI recommended outpatient f/u for consideration for EGD once she is 3 months out from CVA. GI recommendation for BID PPI x 8 weeks. GI has since signed off -Had discussed with and updated family regarding above recommendations on 12/12/2020 over phone, spoke with daughter Stacey Fuentes.  Family is in full agreement. -Recent hemoglobin trends have increased, most recently up to 11.3  6.  Hypertension -BP noted to be borderline -Discontinued Norvasc,  bp stable -Avoid hypotension  given recent CVA  7.  Dementia -Continue home regimen Namenda. -Suspect related to poor p.o. intake. -Appreciate Nutrition input. Significant wt loss and decreased interest in food observed likely due to both acute illness and progressive dementia -Recently discussed with pt's daughter over phone who has observed markedly poor PO intake even prior to admit following CVA. Also noted to have a dramatic function decline. Daughter understanding of natural progression of likely vascular dementia and is agreeable to discussion with Palliative Care for goals of care -Seen by palliative care and family has elected to continue with full code/full measures but wishes to take patient home with palliative care to follow as an outpatient to monitor progression  8.  GERD -Continued on PPI.  9.  Normocytic anemia -Patient with no overt bleeding during this hospitalization. -Hemoglobin has trended up to 11.2 on most recent check -Without evidence of acute blood loss, staff reports brown-colored stools with no evidence of melena or blood -GI recs for 8 weeks of BID PPI per above   10.  Constipation -Daughter over phone reports concerns of constipation recently -Resolved.  Multiple medium size stools noted recently -Patient is now continued on daily miralax, continue with cathartics as needed   11.  Hypomagnesemia, hypokalemia -Suspect secondary to chronic malnutrition, in setting of known dementia -K 3.4, will correct -Mg corrected -Continue to follow lytes and replace as needed  Discharge Instructions  Discharge Instructions     Diet - low sodium heart healthy   Complete by: As directed    Increase activity slowly   Complete by: As directed       Allergies as of 12/20/2020       Reactions   Citalopram Nausea Only   Abd pain   Metformin And Related Nausea Only   Saxagliptin Nausea Only        Medication List     STOP taking these medications     amLODipine 2.5 MG tablet Commonly known as: NORVASC       TAKE these medications    aspirin 81 MG tablet Take 1 tablet (81 mg total) by mouth daily.   atorvastatin 40 MG tablet Commonly known as: LIPITOR Take 1 tablet (40 mg total) by mouth daily.   blood glucose meter kit and supplies Dispense based on patient and insurance preference. Use up to four times daily as directed. (FOR ICD-10 E10.9, E11.9).   calcium carbonate 1500 (600 Ca) MG Tabs tablet Commonly known as: OSCAL Take 1 tablet (1,500 mg total) by mouth daily with breakfast.   esomeprazole 40 MG capsule Commonly known as: NEXIUM Take 1 capsule (40 mg total) by mouth 2 (two) times daily before a meal. What changed: when to take this   Fish Oil 1000 MG Caps Take 1 capsule (1,000 mg total) by mouth daily.   insulin aspart 100 UNIT/ML injection Commonly known as: NovoLOG Inject 0-10 Units into the skin as directed. 0-9 Units, Subcutaneous, 3 times daily with meals: 0-69 implement hypoglycemic measures, 70-150=0 units, 151-199=2 units, 200-249=4 units, 250-299=6 units, 300-349=8 units, 350-399=10 units, [phone] Notify MD   insulin glargine 100 UNIT/ML Solostar Pen Commonly known as: LANTUS Inject 12 Units into the skin daily.   Insulin Pen Needle 32G X 4 MM Misc 1 each by Does not apply route as needed.   latanoprost 0.005 % ophthalmic solution Commonly known as: XALATAN Place 1 drop into both eyes every evening.   memantine 5 MG tablet Commonly known as: NAMENDA Take 1 tablet (5 mg  total) by mouth daily.   multivitamin with minerals Tabs tablet Take 1 tablet by mouth daily.   polyethylene glycol 17 g packet Commonly known as: MIRALAX / GLYCOLAX Take 17 g by mouth daily.   vitamin B-12 1000 MCG tablet Commonly known as: CYANOCOBALAMIN Take 1 tablet (1,000 mcg total) by mouth daily.        Follow-up Information     [facility] Follow up.   Why: home rolling walker Contact  information: [place] [phone] [phone]         [facility] Follow up.   Specialty: Home Health Services Why: [facility] nursing/physical therapy/occupational therapy/aide,social worker Contact information: [place] [address] [phone]         [facility] Follow up.   Specialty: PALLIATIVE CARE Why: The office will contact you. Contact information: [place] [address] [phone]               Allergies  Allergen Reactions   Citalopram Nausea Only    Abd pain   Metformin And Related Nausea Only   Saxagliptin Nausea Only    Consultations: Gastroenterology Palliative care   Procedures/Studies: CT HEAD WO CONTRAST  Result Date: 12/09/2020 CLINICAL DATA:  Altered mental status, fever, dementia EXAM: CT HEAD WITHOUT CONTRAST TECHNIQUE: Contiguous axial images were obtained from the base of the skull through the vertex without intravenous contrast. COMPARISON:  12/03/2020 FINDINGS: Brain: No evidence of acute infarction, hemorrhage, hydrocephalus, extra-axial collection or mass lesion/mass effect. Mild age related atrophy. Subcortical white matter and periventricular small vessel ischemic changes. Basal ganglia calcifications. Vascular: Intracranial atherosclerosis. Skull: Normal. Negative for fracture or focal lesion. Sinuses/Orbits: The visualized paranasal sinuses are essentially clear. The mastoid air cells are unopacified. Other: None. IMPRESSION: No evidence of acute intracranial abnormality. Mild atrophy with small vessel ischemic changes. Electronically Signed   By: Julian Hy M.D.   On: 12/09/2020 23:09   CT HEAD WO CONTRAST (5MM)  Result Date: 12/03/2020 CLINICAL DATA:  Mental status change EXAM: CT HEAD WITHOUT CONTRAST TECHNIQUE: Contiguous axial images were obtained from the base of the skull through the vertex without intravenous  contrast. COMPARISON:  11/26/2020 FINDINGS: Brain: No evidence of acute infarction, hemorrhage, hydrocephalus, extra-axial collection or mass lesion/mass effect. Age related atrophy. Calcifications in the basal ganglia. Periventricular white matter changes, likely the sequela of chronic small vessel ischemic disease. Vascular: No hyperdense vessel. Atherosclerotic calcifications in the intracranial carotid and vertebral arteries. Skull: No acute osseous abnormality. Sinuses/Orbits: Mucosal thickening in the ethmoid air cells. Status post bilateral lens replacements. Other: The mastoids are well aerated. IMPRESSION: No acute intracranial process. Electronically Signed   By: Merilyn Baba M.D.   On: 12/03/2020 03:17   CT HEAD WO CONTRAST (5MM)  Result Date: 11/26/2020 CLINICAL DATA:  Mental status changes of unknown cause. Hyperglycemia. EXAM: CT HEAD WITHOUT CONTRAST TECHNIQUE: Contiguous axial images were obtained from the base of the skull through the vertex without intravenous contrast. COMPARISON:  11/05/2020 FINDINGS: Brain: Generalized age related atrophy. There chronic small-vessel ischemic changes throughout the cerebral hemispheric white matter. Small acute/subacute cerebellar infarctions shown by recent MRI are not visible by CT. No mass, hemorrhage, hydrocephalus or extra-axial collection. Vascular: There is atherosclerotic calcification of the major vessels at the base of the brain. Skull: Negative Sinuses/Orbits: Clear/normal Other: None IMPRESSION: No acute finding by CT. Generalized atrophy. Extensive chronic small-vessel ischemic changes of the cerebral hemispheric  white matter. Electronically Signed   By: Nelson Chimes M.D.   On: 11/26/2020 13:48   MR BRAIN WO CONTRAST  Result Date: 12/03/2020 CLINICAL DATA:  85 year old female with acute neurologic deficit. Altered mental status. EXAM: MRI HEAD WITHOUT CONTRAST TECHNIQUE: Multiplanar, multiecho pulse sequences of the brain and surrounding  structures were obtained without intravenous contrast. COMPARISON:  Head CT 0307 hours today.  Recent brain MRI 11/27/2020. FINDINGS: Brain: No restricted diffusion to suggest acute infarction. No midline shift, mass effect, evidence of mass lesion, ventriculomegaly, extra-axial collection or acute intracranial hemorrhage. Cervicomedullary junction and pituitary are within normal limits. Stable cerebral volume. Patchy and confluent bilateral cerebral white matter T2 and FLAIR hyperintensity, similar T2 and FLAIR hyperintensity in the medial thalami. Additional small chronic lacunar infarct right corona radiata tracking to the thalamus. Stable gray and white matter signal throughout the brain. No cortical encephalomalacia or chronic cerebral blood products identified. Vascular: Major intracranial vascular flow voids are stable. Skull and upper cervical spine: Stable, negative. Sinuses/Orbits: Stable, negative. Other: Mastoids remain aerated. Grossly normal visible internal auditory structures. IMPRESSION: 1. No acute intracranial abnormality. 2. Stable from the recent brain MRI with moderately advanced chronic small vessel disease. Electronically Signed   By: Genevie Ann M.D.   On: 12/03/2020 06:10   MR BRAIN WO CONTRAST  Result Date: 11/27/2020 CLINICAL DATA:  Mental status change, unknown cause. EXAM: MRI HEAD WITHOUT CONTRAST TECHNIQUE: Multiplanar, multiecho pulse sequences of the brain and surrounding structures were obtained without intravenous contrast. COMPARISON:  Head CT 11/26/2020 and MRI 11/06/2020 FINDINGS: Brain: There is a small amount of residual signal abnormality at the site of the superior cerebellar infarcts on the prior MRI. No acute infarct, intracranial hemorrhage, mass, midline shift, or extra-axial fluid collection is identified. Patchy and confluent T2 hyperintensities in the cerebral white matter bilaterally are unchanged and nonspecific but compatible with moderate chronic small vessel  ischemic disease. Mild cerebral and cerebellar atrophy and asymmetrically severe right mesial temporal lobe volume loss are again noted. Vascular: Major intracranial vascular flow voids are preserved. Skull and upper cervical spine: Unremarkable bone marrow signal. Sinuses/Orbits: Bilateral cataract extraction. Paranasal sinuses and mastoid air cells are clear. Other: None. IMPRESSION: 1. No acute intracranial abnormality. 2. Moderate chronic small vessel ischemic disease. 3. Cerebral atrophy including asymmetrically severe right temporal lobe volume loss. Electronically Signed   By: Logan Bores M.D.   On: 11/27/2020 18:46   CT Abdomen Pelvis W Contrast  Result Date: 12/09/2020 CLINICAL DATA:  Bowel obstruction suspected. Altered mental status. No bowel movement in several days. EXAM: CT ABDOMEN AND PELVIS WITH CONTRAST TECHNIQUE: Multidetector CT imaging of the abdomen and pelvis was performed using the standard protocol following bolus administration of intravenous contrast. CONTRAST:  69mL OMNIPAQUE IOHEXOL 350 MG/ML SOLN COMPARISON:  CT renal 12/03/2020 FINDINGS: Lower chest: Bilateral lower lobe subsegmental atelectasis. No acute abnormality. Small hiatal hernia. Hepatobiliary: No focal liver abnormality. No gallstones, gallbladder wall thickening, or pericholecystic fluid. No biliary dilatation. Pancreas: No focal lesion. Normal pancreatic contour. No surrounding inflammatory changes. No main pancreatic ductal dilatation. Spleen: Normal in size without focal abnormality. Adrenals/Urinary Tract: No adrenal nodule bilaterally. Bilateral kidneys enhance symmetrically. No hydronephrosis. No hydroureter. Foley catheter tip and balloon terminate within the urinary bladder lumen. Urinary bladder is decompressed. Urinary bladder wall thickening and mucosal hyperemia. On delayed imaging, there is no urothelial wall thickening and there are no filling defects in the opacified portions of the bilateral collecting  systems or ureters. Stomach/Bowel: Stomach  is within normal limits. No evidence of bowel wall thickening or dilatation. No pneumatosis. Appendix appears normal. Vascular/Lymphatic: No abdominal aorta or iliac aneurysm. Moderate atherosclerotic plaque of the aorta and its branches. No abdominal, pelvic, or inguinal lymphadenopathy. Reproductive: Coarsely calcified lesions within the uterus likely with present uterine fibroids with the largest measuring up to 3.3 cm. Uterus and bilateral adnexa are unremarkable. Other: No intraperitoneal free fluid. No intraperitoneal free gas. No organized fluid collection. Musculoskeletal: No abdominal wall hernia or abnormality. No suspicious lytic or blastic osseous lesions. No acute displaced fracture. Multilevel degenerative changes of the spine. Grade 1 anterolisthesis of L4 on L5. IMPRESSION: 1. Urinary bladder findings suggestive of infection. Correlate with urinalysis. 2. Small hiatal hernia. 3. Likely degenerative uterine fibroids. 4. No bowel obstruction.  No constipation. Electronically Signed   By: Iven Finn M.D.   On: 12/09/2020 23:16   DG Chest Port 1 View  Result Date: 12/09/2020 CLINICAL DATA:  Altered mental status, fever EXAM: PORTABLE CHEST 1 VIEW COMPARISON:  12/03/2020 FINDINGS: Low lung volumes with mild bibasilar atelectasis. No focal consolidation. No pleural effusion or pneumothorax. The heart is normal in size. IMPRESSION: Low lung volumes with mild bibasilar atelectasis. Electronically Signed   By: Julian Hy M.D.   On: 12/09/2020 19:38   DG Chest Portable 1 View  Result Date: 12/03/2020 CLINICAL DATA:  Altered mental status. EXAM: PORTABLE CHEST 1 VIEW COMPARISON:  Chest radiograph dated 11/26/2020. FINDINGS: No focal consolidation, pleural effusion, or pneumothorax. The cardiac silhouette is within limits. No acute osseous pathology. IMPRESSION: No active disease. Electronically Signed   By: Anner Crete M.D.   On: 12/03/2020  02:38   DG Chest Port 1 View  Result Date: 11/26/2020 CLINICAL DATA:  Altered mental status, hyperglycemia, possible sepsis. EXAM: PORTABLE CHEST 1 VIEW COMPARISON:  11/05/2020. FINDINGS: Patient is rotated. Trachea is midline. Heart size stable. Lungs are low in volume with minimal bibasilar subsegmental atelectasis. No pleural fluid. IMPRESSION: Low lung volumes with minimal left basilar atelectasis. Electronically Signed   By: Lorin Picket M.D.   On: 11/26/2020 13:40   EEG adult  Result Date: 11/27/2020 Lora Havens, MD     11/27/2020 11:00 AM Patient Name: Stacey Fuentes MRN: [id number] Epilepsy Attending: Lora Havens Referring Physician/Provider: Dr Oren Binet Date: 11/27/2020 Duration: 23.19 mins Patient history: 48 85 year old female with altered mental status.  EEG to evaluate for seizures. Level of alertness: lethargic AEDs during EEG study: None Technical aspects: This EEG study was done with scalp electrodes positioned according to the 10-20 International system of electrode placement. Electrical activity was acquired at a sampling rate of $Remov'500Hz'PZhNGp$  and reviewed with a high frequency filter of $RemoveB'70Hz'CLrApLvR$  and a low frequency filter of $RemoveB'1Hz'frHEbZPh$ . EEG data were recorded continuously and digitally stored. Description: No clear posterior dominant rhythm was seen. EEG showed continuous generalized 3-5 hz theta- delta slowing, at times with triphasic morphology.  Hyperventilation and photic stimulation were not performed.   ABNORMALITY - Continuous slow, generalized IMPRESSION: This study is suggestive of moderate diffuse encephalopathy, nonspecific etiology but could be secondary to toxic-metabolic causes. No seizures or epileptiform discharges were seen throughout the recording. Lora Havens   CT Renal Stone Study  Result Date: 12/03/2020 CLINICAL DATA:  85 year old female with history of flank pain. Suspected kidney stone. EXAM: CT ABDOMEN AND PELVIS WITHOUT CONTRAST TECHNIQUE:  Multidetector CT imaging of the abdomen and pelvis was performed following the standard protocol without IV contrast. COMPARISON:  CT the abdomen and  pelvis 09/25/2017. FINDINGS: Lower chest: Atherosclerotic calcifications in the thoracic aorta as well as the left main, left anterior descending, left circumflex and right coronary arteries. Hepatobiliary: No definite suspicious cystic or solid hepatic lesions are confidently identified on today's noncontrast CT examination. Unenhanced appearance of the gallbladder is unremarkable. Pancreas: No definite pancreatic mass or peripancreatic fluid collections or inflammatory changes are noted on today's noncontrast CT examination. Spleen: Unremarkable. Adrenals/Urinary Tract: There are no abnormal calcifications within the collecting system of either kidney, along the course of either ureter, or within the lumen of the urinary bladder. No hydroureteronephrosis or perinephric stranding to suggest urinary tract obstruction at this time. The unenhanced appearance of the kidneys is unremarkable bilaterally. Urinary bladder is moderately distended, but otherwise unremarkable in appearance. Bilateral adrenal glands are normal in appearance. Stomach/Bowel: Unenhanced appearance of the stomach is unremarkable. No pathologic dilatation of small bowel or colon. The appendix is not confidently identified and may be surgically absent. Regardless, there are no inflammatory changes noted adjacent to the cecum to suggest the presence of an acute appendicitis at this time. Vascular/Lymphatic: Aortic atherosclerosis. No lymphadenopathy noted in the abdomen or pelvis. Reproductive: There are numerous densely calcified lesions scattered throughout the uterus, largest of which is exophytic in the right-side of the fundal region measuring up to 3.1 x 2.8 cm, presumably multifocal fibroids. Ovaries are unremarkable in appearance. Other: No significant volume of ascites.  No pneumoperitoneum.  Musculoskeletal: Chronic compression fracture of L1 with 20% loss of anterior vertebral body height, similar to the prior examination. There are no aggressive appearing lytic or blastic lesions noted in the visualized portions of the skeleton. IMPRESSION: 1. No acute findings are noted in the abdomen or pelvis to account for the patient's symptoms. Specifically, no urinary tract calculi no findings of urinary tract obstruction. 2. Moderate distension of the urinary bladder. Clinical correlation for signs and symptoms of bladder outlet obstruction is recommended. 3. Aortic atherosclerosis, in addition to left main and 3 vessel coronary artery disease. 4. Additional incidental findings, as above. Electronically Signed   By: Vinnie Langton M.D.   On: 12/03/2020 07:52   DG Hip Unilat W or Wo Pelvis 2-3 Views Left  Result Date: 12/03/2020 CLINICAL DATA:  Left hip pain. EXAM: DG HIP (WITH OR WITHOUT PELVIS) 2-3V LEFT COMPARISON:  Pelvic radiograph dated 03/02/2011. FINDINGS: No acute fracture or dislocation. No significant arthritic changes. The bones are osteopenic. Vascular calcification and probable calcified uterine fibroid. The soft tissues are unremarkable. IMPRESSION: No acute fracture or dislocation. Electronically Signed   By: Anner Crete M.D.   On: 12/03/2020 03:50      Subjective: Patient is sitting up in bed, she is being fed breakfast.  Denies any complaints.  Discharge Exam: Vitals:   12/19/20 0512 12/19/20 1117 12/19/20 2033 12/20/20 0442  BP: (!) 117/58 109/67 (!) 149/76 (!) 104/52  Pulse: 96 (!) 105 98 90  Resp: $Remo'18 16 18 18  'QzUsS$ Temp: 98 F (36.7 C) 98 F (36.7 C) 98 F (36.7 C) 97.9 F (36.6 C)  TempSrc:  Oral Oral   SpO2: 100% 97% 100% 100%  Weight:      Height:        General: Pt is alert, awake, not in acute distress Cardiovascular: RRR, S1/S2 +, no rubs, no gallops Respiratory: CTA bilaterally, no wheezing, no rhonchi Abdominal: Soft, NT, ND, bowel sounds  + Extremities: no edema, no cyanosis    The results of significant diagnostics from this hospitalization (including imaging, microbiology,  ancillary and laboratory) are listed below for reference.     Microbiology: No results found for this or any previous visit (from the past 240 hour(s)).   Labs: BNP (last 3 results) No results for input(s): BNP in the last 8760 hours. Basic Metabolic Panel: Recent Labs  Lab 12/14/20 0435 12/14/20 1822 12/15/20 1645 12/16/20 0540 12/17/20 0421 12/18/20 0440 12/19/20 0447  NA 140   < > 137 138 138 136 136  K 3.0*   < > 4.7 3.9 4.1 3.4* 4.7  CL 106   < > 108 105 102 101 104  CO2 24   < > $R'23 25 26 28 27  'Iy$ GLUCOSE 83   < > 228* 179* 156* 251* 338*  BUN 7*   < > 8 5* 6* 7* 11  CREATININE 0.97   < > 0.96 0.68 0.67 0.77 1.03*  CALCIUM 8.9   < > 9.1 9.1 9.0 9.3 9.4  MG 2.0  --   --   --  1.6* 2.2 1.9   < > = values in this interval not displayed.   Liver Function Tests: Recent Labs  Lab 12/15/20 0450 12/16/20 0540 12/17/20 0421 12/18/20 0440 12/19/20 0447  AST 28 29 33 29 36  ALT $Re'17 22 20 26 28  'pUH$ ALKPHOS 41 48 43 48 55  BILITOT 0.7 0.6 0.9 0.3 0.5  PROT 5.5* 5.7* 5.6* 5.7* 5.7*  ALBUMIN 2.7* 2.9* 2.7* 2.8* 2.9*   No results for input(s): LIPASE, AMYLASE in the last 168 hours. No results for input(s): AMMONIA in the last 168 hours. CBC: Recent Labs  Lab 12/16/20 0540 12/19/20 0447  WBC 5.1 5.4  HGB 11.2* 11.5*  HCT 34.6* 36.7  MCV 96.6 98.9  PLT 194 186   Cardiac Enzymes: No results for input(s): CKTOTAL, CKMB, CKMBINDEX, TROPONINI in the last 168 hours. BNP: Invalid input(s): POCBNP CBG: Recent Labs  Lab 12/19/20 1114 12/19/20 1628 12/19/20 2033 12/20/20 0735 12/20/20 1144  GLUCAP 332* 99 242* 306* 294*   D-Dimer No results for input(s): DDIMER in the last 72 hours. Hgb A1c No results for input(s): HGBA1C in the last 72 hours. Lipid Profile No results for input(s): CHOL, HDL, LDLCALC, TRIG, CHOLHDL, LDLDIRECT  in the last 72 hours. Thyroid function studies No results for input(s): TSH, T4TOTAL, T3FREE, THYROIDAB in the last 72 hours.  Invalid input(s): FREET3 Anemia work up No results for input(s): VITAMINB12, FOLATE, FERRITIN, TIBC, IRON, RETICCTPCT in the last 72 hours. Urinalysis    Component Value Date/Time   COLORURINE YELLOW 12/09/2020 2137   APPEARANCEUR CLOUDY (A) 12/09/2020 2137   LABSPEC 1.015 12/09/2020 2137   PHURINE 5.0 12/09/2020 2137   GLUCOSEU 50 (A) 12/09/2020 2137   HGBUR MODERATE (A) 12/09/2020 2137   BILIRUBINUR NEGATIVE 12/09/2020 2137   KETONESUR 20 (A) 12/09/2020 2137   PROTEINUR NEGATIVE 12/09/2020 2137   NITRITE NEGATIVE 12/09/2020 2137   LEUKOCYTESUR LARGE (A) 12/09/2020 2137   Sepsis Labs Invalid input(s): PROCALCITONIN,  WBC,  LACTICIDVEN Microbiology No results found for this or any previous visit (from the past 240 hour(s)).   Time coordinating discharge: 43mins  SIGNED:   Kathie Dike, MD  [facility] 12/20/2020, 8:41 PM   If 7PM-7AM, please contact night-coverage [email or web address]

## 2020-12-21 ENCOUNTER — Other Ambulatory Visit: Payer: Self-pay

## 2020-12-21 DIAGNOSIS — E1169 Type 2 diabetes mellitus with other specified complication: Secondary | ICD-10-CM

## 2020-12-21 DIAGNOSIS — Z794 Long term (current) use of insulin: Secondary | ICD-10-CM

## 2020-12-23 DIAGNOSIS — Z9842 Cataract extraction status, left eye: Secondary | ICD-10-CM | POA: Diagnosis not present

## 2020-12-23 DIAGNOSIS — E440 Moderate protein-calorie malnutrition: Secondary | ICD-10-CM | POA: Diagnosis not present

## 2020-12-23 DIAGNOSIS — G250 Essential tremor: Secondary | ICD-10-CM | POA: Diagnosis not present

## 2020-12-23 DIAGNOSIS — E7800 Pure hypercholesterolemia, unspecified: Secondary | ICD-10-CM | POA: Diagnosis not present

## 2020-12-23 DIAGNOSIS — Z9181 History of falling: Secondary | ICD-10-CM | POA: Diagnosis not present

## 2020-12-23 DIAGNOSIS — Z794 Long term (current) use of insulin: Secondary | ICD-10-CM | POA: Diagnosis not present

## 2020-12-23 DIAGNOSIS — M810 Age-related osteoporosis without current pathological fracture: Secondary | ICD-10-CM | POA: Diagnosis not present

## 2020-12-23 DIAGNOSIS — N183 Chronic kidney disease, stage 3 unspecified: Secondary | ICD-10-CM | POA: Diagnosis not present

## 2020-12-23 DIAGNOSIS — K5900 Constipation, unspecified: Secondary | ICD-10-CM | POA: Diagnosis not present

## 2020-12-23 DIAGNOSIS — R609 Edema, unspecified: Secondary | ICD-10-CM | POA: Diagnosis not present

## 2020-12-23 DIAGNOSIS — Z8619 Personal history of other infectious and parasitic diseases: Secondary | ICD-10-CM | POA: Diagnosis not present

## 2020-12-23 DIAGNOSIS — I129 Hypertensive chronic kidney disease with stage 1 through stage 4 chronic kidney disease, or unspecified chronic kidney disease: Secondary | ICD-10-CM | POA: Diagnosis not present

## 2020-12-23 DIAGNOSIS — Z9841 Cataract extraction status, right eye: Secondary | ICD-10-CM | POA: Diagnosis not present

## 2020-12-23 DIAGNOSIS — Z86010 Personal history of colonic polyps: Secondary | ICD-10-CM | POA: Diagnosis not present

## 2020-12-23 DIAGNOSIS — E1122 Type 2 diabetes mellitus with diabetic chronic kidney disease: Secondary | ICD-10-CM | POA: Diagnosis not present

## 2020-12-24 ENCOUNTER — Telehealth: Payer: Self-pay

## 2020-12-24 ENCOUNTER — Other Ambulatory Visit: Payer: Self-pay | Admitting: *Deleted

## 2020-12-24 NOTE — Telephone Encounter (Signed)
Spoke with patient's daughter [name] and scheduled an in-person Palliative Consult for 12/26/20 @ 11:30 AM  with Dr. Hollace Kinnier. Documentation will be noted in [facility].   COVID screening was negative. Has a dog in the home will put away for visit. Patient lives with daughter.  Consent obtained; updated Outlook/Netsmart/Team List and Epic.   Family is aware they may be receiving a call from provider the day before or day of to confirm appointment.

## 2020-12-24 NOTE — Patient Outreach (Signed)
[facility]) Care Management  [place]  12/24/2020   Stacey Fuentes May 07, 1935 [id number]   Referral Date: 11/11 Referral Source: Hospital Liaison Referral Reason: Post hospital discharge Insurance: [facility]   Outreach attempt #1, successful to daughter.  Identity verified.  This care manager introduced self and stated purpose of call.  [facility] care management services explained.    Social: Member lives with daughter Stacey Fuentes.  She explains frustration regarding member's medical events and hospitalizations over the last several months.  She has been admitted to hospital 3 times since September for Stroke, DKA, and UTI.  Between these admissions, she was admitted to SNF and had fall while a facility, decision made not to send her back.    Daughter report member has not been doing well since her discharge, unable to do eat or speak, incontinent, and overall health steadily declining.  She report while in hospital, it was recommended to "let my mom die."  She was not ready to have that conversation although physicians have told her there were no further interventions that could be done for member.  Advised that [facility] has attempted to reach her via My Chart, state she does not have complete access.  Provided with contact information, encouraged to call for visit.    Member does have home health ordered, [facility] has been out to the home for initial visit.  RN, PT, OT, and home aide has been requested.  Daughter report member is not eating, drinking ensure only through syringe and applesauce intermittently with crushed meds.  Active with [facility] for [facility], receiving 82 hours a month, daughter has requested to increase hours, requested PCP office to send information for approval.    Conditions: Per chart, member has history of HTN, GERD, DM, Dementia, Stroke, and CKD.  Daughter monitoring blood sugars, state they are elevated although member is not eating.    Medications:  Reviewed with daughter, only taking Insulin at this time as member is not eating and she has not been able to swallow pills.   Appointments: Does not have follow up scheduled with PCP as she is unable to get member up and out of the home.  She is open to having provider come into the home.  Advised that [facility] has PCP that can be member's primary provider should they choose, particularly if hospice is the final decision.  Consent: Agrees to [facility] involvement, however scheduled for [facility] in home assessment on 11/16.  Should member be admitted to hospice, will close case.  If not, will discuss having [facility] come into the home as the primary provider team.   Encounter Medications:  Outpatient Encounter Medications as of 12/24/2020  Medication Sig   blood glucose meter kit and supplies Dispense based on patient and insurance preference. Use up to four times daily as directed. (FOR ICD-10 E10.9, E11.9).   insulin aspart (NOVOLOG) 100 UNIT/ML injection Inject 0-10 Units into the skin as directed. 0-9 Units, Subcutaneous, 3 times daily with meals: 0-69 implement hypoglycemic measures, 70-150=0 units, 151-199=2 units, 200-249=4 units, 250-299=6 units, 300-349=8 units, 350-399=10 units, [phone] Notify MD   insulin glargine (LANTUS) 100 UNIT/ML Solostar Pen Inject 12 Units into the skin daily.   Insulin Pen Needle 32G X 4 MM MISC 1 each by Does not apply route as needed.   aspirin 81 MG tablet Take 1 tablet (81 mg total) by mouth daily. (Patient not taking: Reported on 12/24/2020)   atorvastatin (LIPITOR) 40 MG tablet Take 1 tablet (40  mg total) by mouth daily. (Patient not taking: Reported on 12/24/2020)   calcium carbonate (OSCAL) 1500 (600 Ca) MG TABS tablet Take 1 tablet (1,500 mg total) by mouth daily with breakfast. (Patient not taking: No sig reported)   esomeprazole (NEXIUM) 40 MG capsule Take 1 capsule (40 mg total) by mouth 2 (two) times daily before a meal. (Patient not taking:  Reported on 12/24/2020)   latanoprost (XALATAN) 0.005 % ophthalmic solution Place 1 drop into both eyes every evening. (Patient not taking: No sig reported)   memantine (NAMENDA) 5 MG tablet Take 1 tablet (5 mg total) by mouth daily. (Patient not taking: Reported on 12/24/2020)   Multiple Vitamin (MULTIVITAMIN WITH MINERALS) TABS tablet Take 1 tablet by mouth daily. (Patient not taking: Reported on 12/24/2020)   Omega-3 Fatty Acids (FISH OIL) 1000 MG CAPS Take 1 capsule (1,000 mg total) by mouth daily. (Patient not taking: No sig reported)   polyethylene glycol (MIRALAX / GLYCOLAX) 17 g packet Take 17 g by mouth daily. (Patient not taking: Reported on 12/24/2020)   vitamin B-12 (CYANOCOBALAMIN) 1000 MCG tablet Take 1 tablet (1,000 mcg total) by mouth daily. (Patient not taking: No sig reported)   No facility-administered encounter medications on file as of 12/24/2020.    Functional Status:  In your present state of health, do you have any difficulty performing the following activities: 12/10/2020  Hearing? Y  Comment HOH  Vision? Y  Difficulty concentrating or making decisions? Y  Walking or climbing stairs? Y  Comment secondary to weakness  Dressing or bathing? Y  Comment secondary to weakness  Doing errands, shopping? Y  Some recent data might be hidden    Fall/Depression Screening: Fall Risk  06/26/2017  Falls in the past year? No   PHQ 2/9 Scores 06/26/2017  PHQ - 2 Score 0     Plan:  Follow-up: Follow-up in 1 week(s)  If member/daughter not active with hospice, will complete initial assessment and develop individualized care plan.  [name], [place], MSN [place] [phone]

## 2020-12-25 ENCOUNTER — Encounter: Payer: Self-pay | Admitting: *Deleted

## 2020-12-26 DIAGNOSIS — Z794 Long term (current) use of insulin: Secondary | ICD-10-CM | POA: Diagnosis not present

## 2020-12-26 DIAGNOSIS — G250 Essential tremor: Secondary | ICD-10-CM | POA: Diagnosis not present

## 2020-12-26 DIAGNOSIS — N183 Chronic kidney disease, stage 3 unspecified: Secondary | ICD-10-CM | POA: Diagnosis not present

## 2020-12-26 DIAGNOSIS — Z86010 Personal history of colonic polyps: Secondary | ICD-10-CM | POA: Diagnosis not present

## 2020-12-26 DIAGNOSIS — Z9842 Cataract extraction status, left eye: Secondary | ICD-10-CM | POA: Diagnosis not present

## 2020-12-26 DIAGNOSIS — Z9181 History of falling: Secondary | ICD-10-CM | POA: Diagnosis not present

## 2020-12-26 DIAGNOSIS — E7800 Pure hypercholesterolemia, unspecified: Secondary | ICD-10-CM | POA: Diagnosis not present

## 2020-12-26 DIAGNOSIS — E440 Moderate protein-calorie malnutrition: Secondary | ICD-10-CM | POA: Diagnosis not present

## 2020-12-26 DIAGNOSIS — R609 Edema, unspecified: Secondary | ICD-10-CM | POA: Diagnosis not present

## 2020-12-26 DIAGNOSIS — Z8619 Personal history of other infectious and parasitic diseases: Secondary | ICD-10-CM | POA: Diagnosis not present

## 2020-12-26 DIAGNOSIS — I129 Hypertensive chronic kidney disease with stage 1 through stage 4 chronic kidney disease, or unspecified chronic kidney disease: Secondary | ICD-10-CM | POA: Diagnosis not present

## 2020-12-26 DIAGNOSIS — E1122 Type 2 diabetes mellitus with diabetic chronic kidney disease: Secondary | ICD-10-CM | POA: Diagnosis not present

## 2020-12-26 DIAGNOSIS — Z9841 Cataract extraction status, right eye: Secondary | ICD-10-CM | POA: Diagnosis not present

## 2020-12-26 DIAGNOSIS — K5900 Constipation, unspecified: Secondary | ICD-10-CM | POA: Diagnosis not present

## 2020-12-26 DIAGNOSIS — M810 Age-related osteoporosis without current pathological fracture: Secondary | ICD-10-CM | POA: Diagnosis not present

## 2020-12-27 DIAGNOSIS — Z9842 Cataract extraction status, left eye: Secondary | ICD-10-CM | POA: Diagnosis not present

## 2020-12-27 DIAGNOSIS — E1122 Type 2 diabetes mellitus with diabetic chronic kidney disease: Secondary | ICD-10-CM | POA: Diagnosis not present

## 2020-12-27 DIAGNOSIS — Z86010 Personal history of colonic polyps: Secondary | ICD-10-CM | POA: Diagnosis not present

## 2020-12-27 DIAGNOSIS — R609 Edema, unspecified: Secondary | ICD-10-CM | POA: Diagnosis not present

## 2020-12-27 DIAGNOSIS — E7800 Pure hypercholesterolemia, unspecified: Secondary | ICD-10-CM | POA: Diagnosis not present

## 2020-12-27 DIAGNOSIS — I129 Hypertensive chronic kidney disease with stage 1 through stage 4 chronic kidney disease, or unspecified chronic kidney disease: Secondary | ICD-10-CM | POA: Diagnosis not present

## 2020-12-27 DIAGNOSIS — Z794 Long term (current) use of insulin: Secondary | ICD-10-CM | POA: Diagnosis not present

## 2020-12-27 DIAGNOSIS — Z9841 Cataract extraction status, right eye: Secondary | ICD-10-CM | POA: Diagnosis not present

## 2020-12-27 DIAGNOSIS — K5900 Constipation, unspecified: Secondary | ICD-10-CM | POA: Diagnosis not present

## 2020-12-27 DIAGNOSIS — M810 Age-related osteoporosis without current pathological fracture: Secondary | ICD-10-CM | POA: Diagnosis not present

## 2020-12-27 DIAGNOSIS — N183 Chronic kidney disease, stage 3 unspecified: Secondary | ICD-10-CM | POA: Diagnosis not present

## 2020-12-27 DIAGNOSIS — Z8619 Personal history of other infectious and parasitic diseases: Secondary | ICD-10-CM | POA: Diagnosis not present

## 2020-12-27 DIAGNOSIS — G250 Essential tremor: Secondary | ICD-10-CM | POA: Diagnosis not present

## 2020-12-27 DIAGNOSIS — E440 Moderate protein-calorie malnutrition: Secondary | ICD-10-CM | POA: Diagnosis not present

## 2020-12-27 DIAGNOSIS — Z9181 History of falling: Secondary | ICD-10-CM | POA: Diagnosis not present

## 2020-12-28 DIAGNOSIS — Z9181 History of falling: Secondary | ICD-10-CM | POA: Diagnosis not present

## 2020-12-28 DIAGNOSIS — Z794 Long term (current) use of insulin: Secondary | ICD-10-CM | POA: Diagnosis not present

## 2020-12-28 DIAGNOSIS — E440 Moderate protein-calorie malnutrition: Secondary | ICD-10-CM | POA: Diagnosis not present

## 2020-12-28 DIAGNOSIS — K5900 Constipation, unspecified: Secondary | ICD-10-CM | POA: Diagnosis not present

## 2020-12-28 DIAGNOSIS — I129 Hypertensive chronic kidney disease with stage 1 through stage 4 chronic kidney disease, or unspecified chronic kidney disease: Secondary | ICD-10-CM | POA: Diagnosis not present

## 2020-12-28 DIAGNOSIS — M810 Age-related osteoporosis without current pathological fracture: Secondary | ICD-10-CM | POA: Diagnosis not present

## 2020-12-28 DIAGNOSIS — G250 Essential tremor: Secondary | ICD-10-CM | POA: Diagnosis not present

## 2020-12-28 DIAGNOSIS — Z86010 Personal history of colonic polyps: Secondary | ICD-10-CM | POA: Diagnosis not present

## 2020-12-28 DIAGNOSIS — E7800 Pure hypercholesterolemia, unspecified: Secondary | ICD-10-CM | POA: Diagnosis not present

## 2020-12-28 DIAGNOSIS — R609 Edema, unspecified: Secondary | ICD-10-CM | POA: Diagnosis not present

## 2020-12-28 DIAGNOSIS — Z9842 Cataract extraction status, left eye: Secondary | ICD-10-CM | POA: Diagnosis not present

## 2020-12-28 DIAGNOSIS — E1122 Type 2 diabetes mellitus with diabetic chronic kidney disease: Secondary | ICD-10-CM | POA: Diagnosis not present

## 2020-12-28 DIAGNOSIS — Z8619 Personal history of other infectious and parasitic diseases: Secondary | ICD-10-CM | POA: Diagnosis not present

## 2020-12-28 DIAGNOSIS — Z9841 Cataract extraction status, right eye: Secondary | ICD-10-CM | POA: Diagnosis not present

## 2020-12-28 DIAGNOSIS — N183 Chronic kidney disease, stage 3 unspecified: Secondary | ICD-10-CM | POA: Diagnosis not present

## 2020-12-31 DIAGNOSIS — Z9842 Cataract extraction status, left eye: Secondary | ICD-10-CM | POA: Diagnosis not present

## 2020-12-31 DIAGNOSIS — R609 Edema, unspecified: Secondary | ICD-10-CM | POA: Diagnosis not present

## 2020-12-31 DIAGNOSIS — I129 Hypertensive chronic kidney disease with stage 1 through stage 4 chronic kidney disease, or unspecified chronic kidney disease: Secondary | ICD-10-CM | POA: Diagnosis not present

## 2020-12-31 DIAGNOSIS — Z86010 Personal history of colonic polyps: Secondary | ICD-10-CM | POA: Diagnosis not present

## 2020-12-31 DIAGNOSIS — Z794 Long term (current) use of insulin: Secondary | ICD-10-CM | POA: Diagnosis not present

## 2020-12-31 DIAGNOSIS — E440 Moderate protein-calorie malnutrition: Secondary | ICD-10-CM | POA: Diagnosis not present

## 2020-12-31 DIAGNOSIS — Z9181 History of falling: Secondary | ICD-10-CM | POA: Diagnosis not present

## 2020-12-31 DIAGNOSIS — N183 Chronic kidney disease, stage 3 unspecified: Secondary | ICD-10-CM | POA: Diagnosis not present

## 2020-12-31 DIAGNOSIS — K5900 Constipation, unspecified: Secondary | ICD-10-CM | POA: Diagnosis not present

## 2020-12-31 DIAGNOSIS — E1122 Type 2 diabetes mellitus with diabetic chronic kidney disease: Secondary | ICD-10-CM | POA: Diagnosis not present

## 2020-12-31 DIAGNOSIS — M810 Age-related osteoporosis without current pathological fracture: Secondary | ICD-10-CM | POA: Diagnosis not present

## 2020-12-31 DIAGNOSIS — Z9841 Cataract extraction status, right eye: Secondary | ICD-10-CM | POA: Diagnosis not present

## 2020-12-31 DIAGNOSIS — E7800 Pure hypercholesterolemia, unspecified: Secondary | ICD-10-CM | POA: Diagnosis not present

## 2020-12-31 DIAGNOSIS — Z8619 Personal history of other infectious and parasitic diseases: Secondary | ICD-10-CM | POA: Diagnosis not present

## 2020-12-31 DIAGNOSIS — G250 Essential tremor: Secondary | ICD-10-CM | POA: Diagnosis not present

## 2021-01-01 ENCOUNTER — Other Ambulatory Visit: Payer: Self-pay | Admitting: *Deleted

## 2021-01-01 DIAGNOSIS — N183 Chronic kidney disease, stage 3 unspecified: Secondary | ICD-10-CM | POA: Diagnosis not present

## 2021-01-01 DIAGNOSIS — E1169 Type 2 diabetes mellitus with other specified complication: Secondary | ICD-10-CM | POA: Diagnosis not present

## 2021-01-01 DIAGNOSIS — E1110 Type 2 diabetes mellitus with ketoacidosis without coma: Secondary | ICD-10-CM | POA: Diagnosis not present

## 2021-01-01 DIAGNOSIS — I639 Cerebral infarction, unspecified: Secondary | ICD-10-CM | POA: Diagnosis not present

## 2021-01-01 NOTE — Patient Outreach (Signed)
[facility] [facility]) Care Management  01/01/2021  Stacey Fuentes 1935/08/24 [id number]   Call placed to member's daughter to follow up on outcome from [facility]'s initial home visit, no answer.  Unable to leave message as mailbox is full.  Will follow up within the next 3-4 business days.  [name], [place], MSN [place] [phone]

## 2021-01-02 ENCOUNTER — Ambulatory Visit: Payer: Self-pay | Admitting: *Deleted

## 2021-01-02 DIAGNOSIS — E1122 Type 2 diabetes mellitus with diabetic chronic kidney disease: Secondary | ICD-10-CM | POA: Diagnosis not present

## 2021-01-02 DIAGNOSIS — N183 Chronic kidney disease, stage 3 unspecified: Secondary | ICD-10-CM | POA: Diagnosis not present

## 2021-01-02 DIAGNOSIS — I129 Hypertensive chronic kidney disease with stage 1 through stage 4 chronic kidney disease, or unspecified chronic kidney disease: Secondary | ICD-10-CM | POA: Diagnosis not present

## 2021-01-02 DIAGNOSIS — G9341 Metabolic encephalopathy: Secondary | ICD-10-CM | POA: Diagnosis not present

## 2021-01-09 ENCOUNTER — Other Ambulatory Visit: Payer: Self-pay | Admitting: *Deleted

## 2021-01-09 NOTE — Patient Outreach (Signed)
[facility] [facility]) Care Management  01/09/2021  Synethia Endicott 1935/08/20 [id number]   Outgoing call placed to member's daughter, [name].  Notified that member passed away on 01-16-23.  Will close case at this time.  [name], [place], MSN [place] [phone]

## 2021-01-10 DEATH — deceased

## 2021-01-11 ENCOUNTER — Ambulatory Visit: Payer: Medicare Other | Admitting: Internal Medicine

## 2021-02-18 ENCOUNTER — Ambulatory Visit: Payer: Medicare Other | Admitting: Cardiology

## 2023-01-13 IMAGING — CT CT HEAD W/O CM
3 of 4 series · 14 of 47 positions shown, 16 images · non-contrast
Comparison: 12/03/2020

CLINICAL DATA: Altered mental status, fever, dementia

EXAM:
CT HEAD WITHOUT CONTRAST
TECHNIQUE: Contiguous axial images were obtained from the base of the skull
through the vertex without intravenous contrast.

[Series 5: coronal soft tissue · coronal · 0.34mm/px · 3 of 73 slices shown]
[im 25/73  brain]
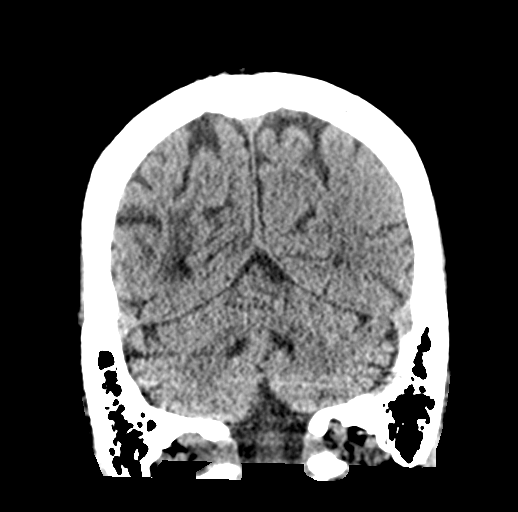
[im 33/73  brain]
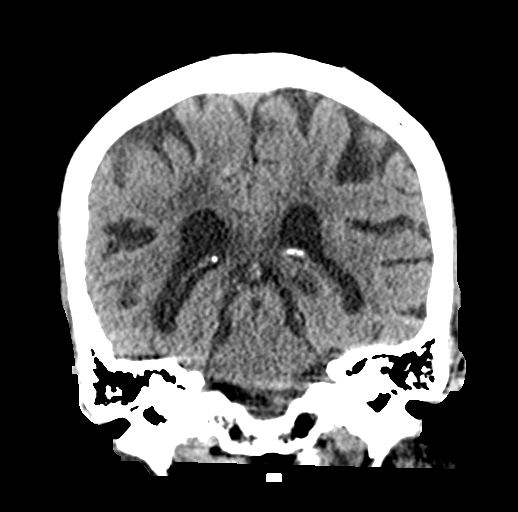
[im 41/73  brain]
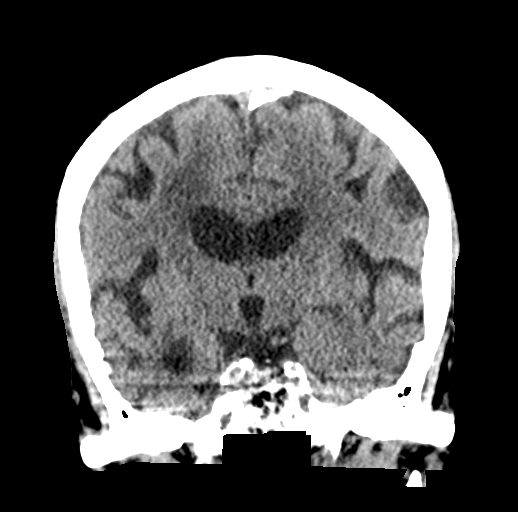

[Series 6: sagittal soft tissue · sagittal · 0.34mm/px · 3 of 60 slices shown]
[im 20/60  brain]
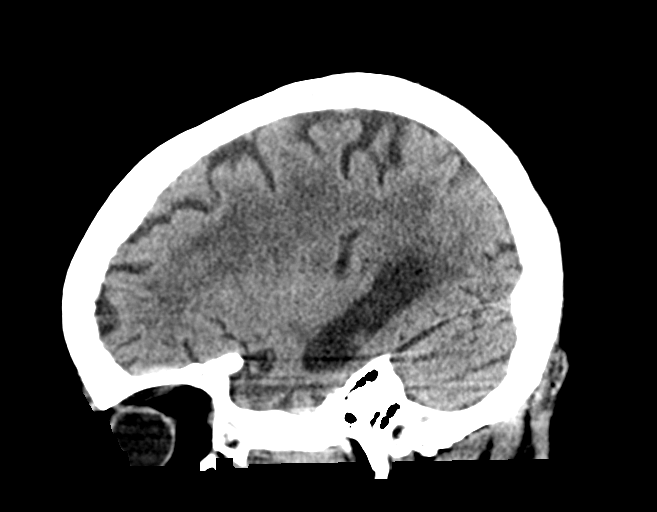
[im 30/60  brain]
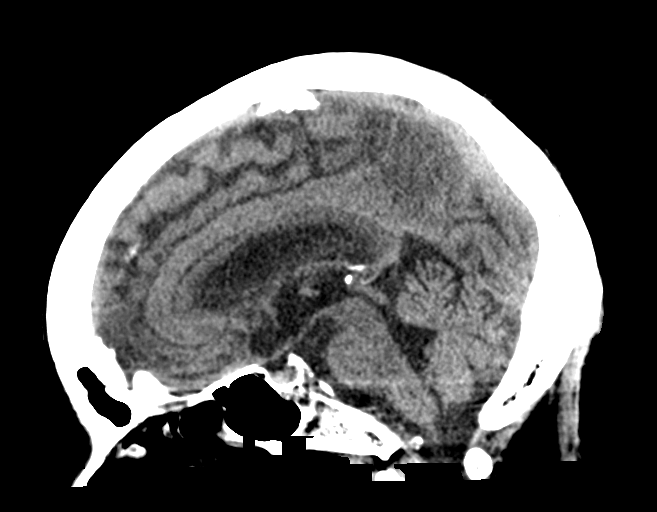
[im 40/60  brain]
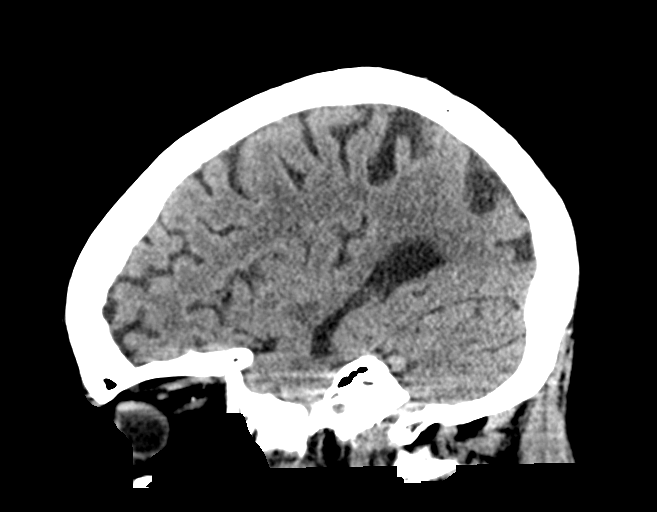

[Series 7: true axial · axial · 0.35mm/px · z∈[-141,-13]mm · 8 of 57 slices shown, 10 images]
[im 7/57  brain]
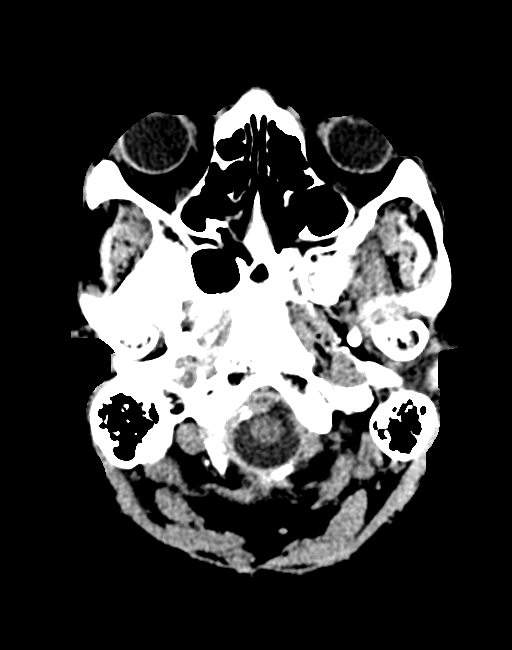
[im 7/57  bone]
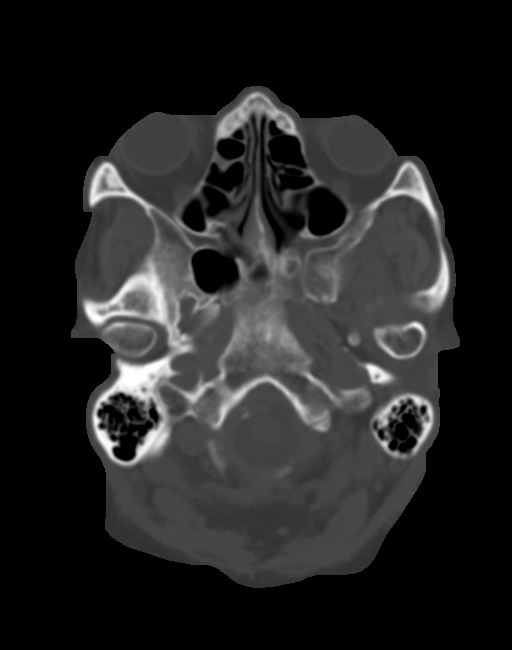
[im 13/57  brain]
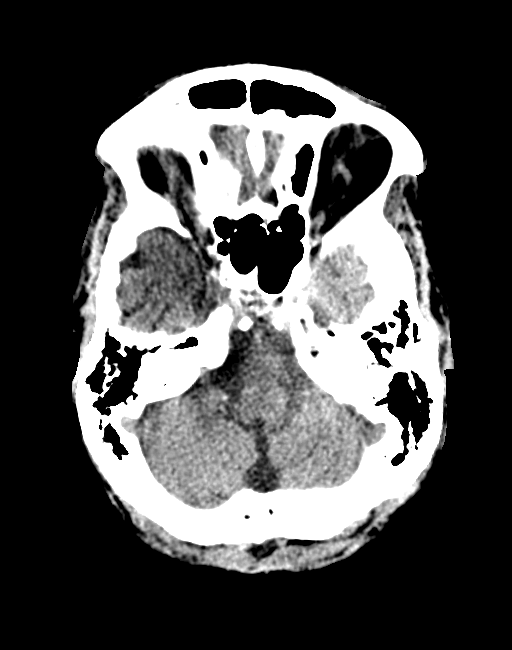
[im 19/57  brain]
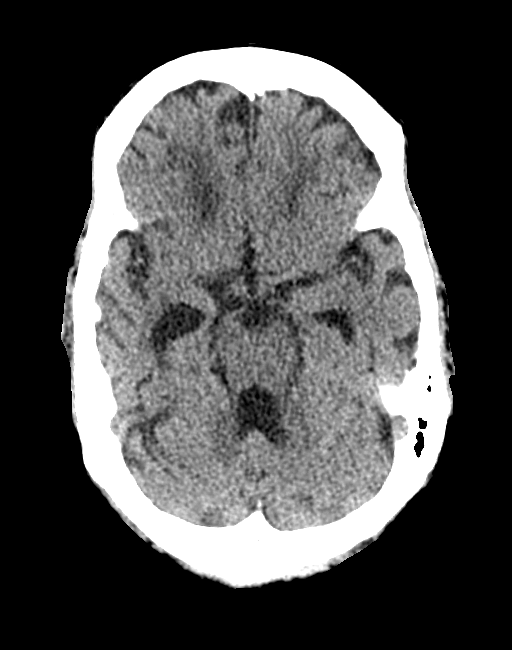
[im 25/57  brain]
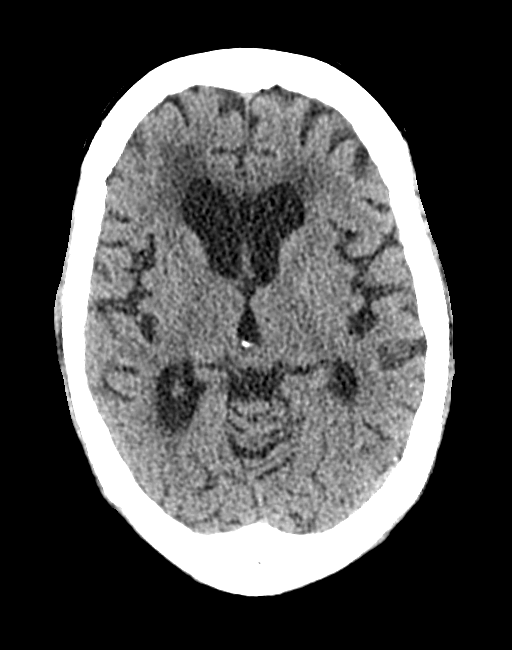
[im 32/57  brain]
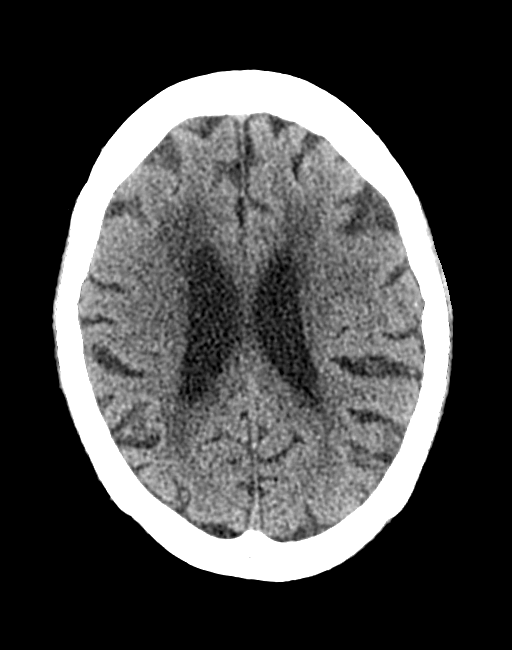
[im 32/57  bone]
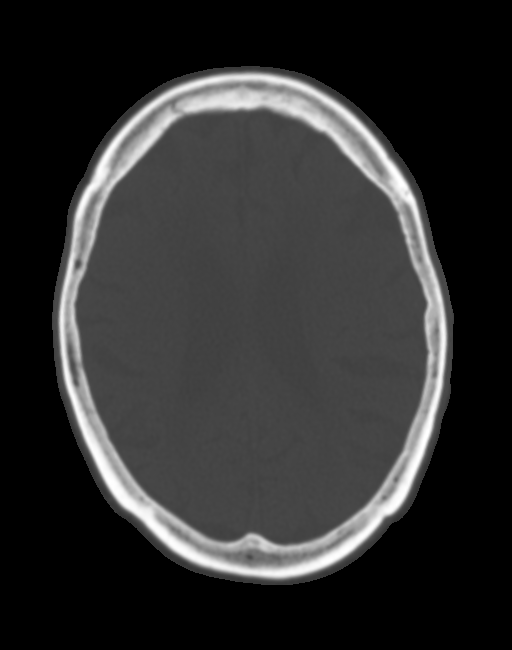
[im 38/57  brain]
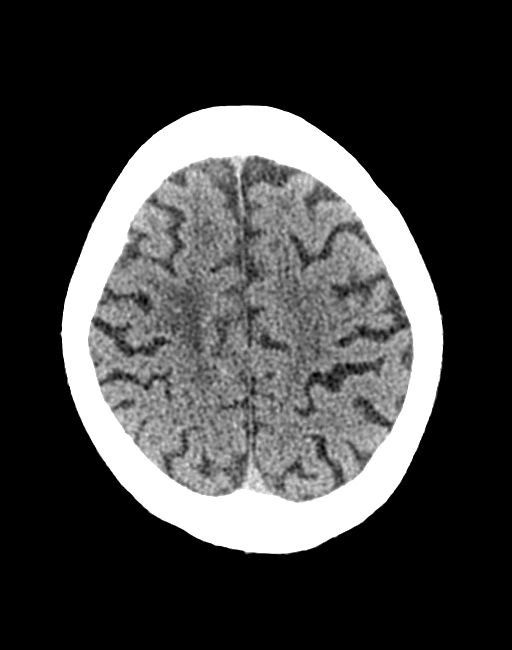
[im 44/57  brain]
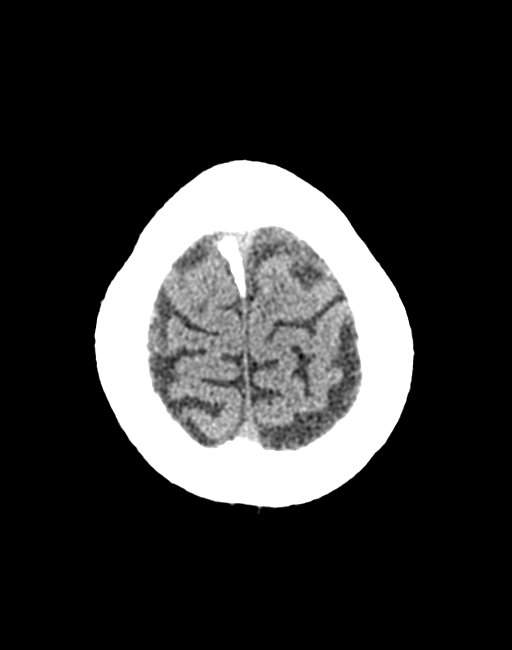
[im 50/57  brain]
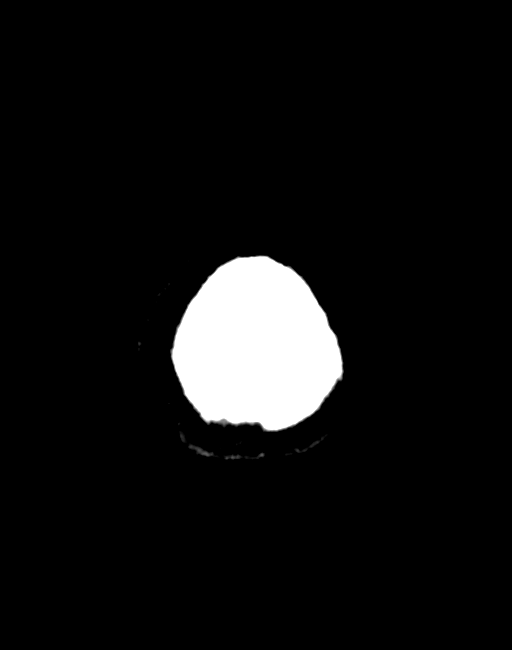

[14 of 47 positions shown; findings below may reference images not displayed]

FINDINGS: Brain: No evidence of acute infarction, hemorrhage, hydrocephalus,
extra-axial collection or mass lesion/mass effect.

Mild age related atrophy. Subcortical white matter and
periventricular small vessel ischemic changes.

Basal ganglia calcifications.

Vascular: Intracranial atherosclerosis.

Skull: Normal. Negative for fracture or focal lesion.

Sinuses/Orbits: The visualized paranasal sinuses are essentially
clear. The mastoid air cells are unopacified.

Other: None.
IMPRESSION: No evidence of acute intracranial abnormality. Mild atrophy with
small vessel ischemic changes.
